# Patient Record
Sex: Male | Born: 1955 | Race: White | Hispanic: No | Smoking: Former smoker
Health system: Southern US, Community
[De-identification: ages and names within clinical notes are randomized; demographics above are authoritative.]

## PROBLEM LIST (undated history)

## (undated) DIAGNOSIS — I10 Essential (primary) hypertension: Secondary | ICD-10-CM

## (undated) DIAGNOSIS — R140 Abdominal distension (gaseous): Secondary | ICD-10-CM

## (undated) DIAGNOSIS — M1990 Unspecified osteoarthritis, unspecified site: Secondary | ICD-10-CM

## (undated) DIAGNOSIS — G629 Polyneuropathy, unspecified: Secondary | ICD-10-CM

## (undated) DIAGNOSIS — F0390 Unspecified dementia without behavioral disturbance: Secondary | ICD-10-CM

## (undated) DIAGNOSIS — K429 Umbilical hernia without obstruction or gangrene: Secondary | ICD-10-CM

## (undated) DIAGNOSIS — Z9981 Dependence on supplemental oxygen: Secondary | ICD-10-CM

## (undated) DIAGNOSIS — K219 Gastro-esophageal reflux disease without esophagitis: Secondary | ICD-10-CM

## (undated) DIAGNOSIS — M79646 Pain in unspecified finger(s): Secondary | ICD-10-CM

## (undated) DIAGNOSIS — C801 Malignant (primary) neoplasm, unspecified: Secondary | ICD-10-CM

## (undated) DIAGNOSIS — E119 Type 2 diabetes mellitus without complications: Secondary | ICD-10-CM

## (undated) DIAGNOSIS — E291 Testicular hypofunction: Secondary | ICD-10-CM

## (undated) DIAGNOSIS — E039 Hypothyroidism, unspecified: Secondary | ICD-10-CM

## (undated) DIAGNOSIS — J449 Chronic obstructive pulmonary disease, unspecified: Secondary | ICD-10-CM

## (undated) DIAGNOSIS — R109 Unspecified abdominal pain: Secondary | ICD-10-CM

## (undated) DIAGNOSIS — M810 Age-related osteoporosis without current pathological fracture: Secondary | ICD-10-CM

## (undated) DIAGNOSIS — Z85828 Personal history of other malignant neoplasm of skin: Secondary | ICD-10-CM

## (undated) DIAGNOSIS — H9190 Unspecified hearing loss, unspecified ear: Secondary | ICD-10-CM

## (undated) DIAGNOSIS — E538 Deficiency of other specified B group vitamins: Secondary | ICD-10-CM

## (undated) DIAGNOSIS — M549 Dorsalgia, unspecified: Secondary | ICD-10-CM

## (undated) DIAGNOSIS — R52 Pain, unspecified: Secondary | ICD-10-CM

## (undated) DIAGNOSIS — G4700 Insomnia, unspecified: Secondary | ICD-10-CM

## (undated) DIAGNOSIS — I639 Cerebral infarction, unspecified: Secondary | ICD-10-CM

## (undated) DIAGNOSIS — IMO0001 Reserved for inherently not codable concepts without codable children: Secondary | ICD-10-CM

## (undated) DIAGNOSIS — K863 Pseudocyst of pancreas: Secondary | ICD-10-CM

## (undated) DIAGNOSIS — G8929 Other chronic pain: Secondary | ICD-10-CM

## (undated) DIAGNOSIS — I2699 Other pulmonary embolism without acute cor pulmonale: Secondary | ICD-10-CM

## (undated) DIAGNOSIS — Z87898 Personal history of other specified conditions: Secondary | ICD-10-CM

## (undated) DIAGNOSIS — I219 Acute myocardial infarction, unspecified: Secondary | ICD-10-CM

## (undated) HISTORY — PX: HERNIA REPAIR: SHX51

## (undated) HISTORY — DX: Pseudocyst of pancreas: K86.3

## (undated) HISTORY — DX: Unspecified hearing loss, unspecified ear: H91.90

## (undated) HISTORY — DX: Type 2 diabetes mellitus without complications: E11.9

## (undated) HISTORY — DX: Hypothyroidism, unspecified: E03.9

## (undated) HISTORY — DX: Age-related osteoporosis without current pathological fracture: M81.0

## (undated) HISTORY — DX: Polyneuropathy, unspecified: G62.9

## (undated) HISTORY — DX: Other pulmonary embolism without acute cor pulmonale: I26.99

## (undated) HISTORY — DX: Cerebral infarction, unspecified: I63.9

## (undated) HISTORY — DX: Unspecified osteoarthritis, unspecified site: M19.90

## (undated) HISTORY — PX: ESOPHAGOGASTRODUODENOSCOPY ENDOSCOPY: SHX5814

## (undated) HISTORY — PX: COLONOSCOPY: SHX174

## (undated) HISTORY — DX: Insomnia, unspecified: G47.00

## (undated) HISTORY — PX: MELANOMA EXCISION: SHX5266

## (undated) HISTORY — DX: Unspecified dementia, unspecified severity, without behavioral disturbance, psychotic disturbance, mood disturbance, and anxiety: F03.90

## (undated) HISTORY — PX: ORIF FIBULA FRACTURE: SHX2121

## (undated) HISTORY — DX: Other chronic pain: G89.29

## (undated) HISTORY — DX: Testicular hypofunction: E29.1

## (undated) HISTORY — DX: Deficiency of other specified B group vitamins: E53.8

## (undated) HISTORY — PX: TONSILLECTOMY: SUR1361

## (undated) HISTORY — PX: SPLENECTOMY: SUR1306

---

## 2013-11-18 DIAGNOSIS — I10 Essential (primary) hypertension: Secondary | ICD-10-CM | POA: Diagnosis not present

## 2013-11-18 DIAGNOSIS — I2510 Atherosclerotic heart disease of native coronary artery without angina pectoris: Secondary | ICD-10-CM | POA: Diagnosis not present

## 2013-11-18 DIAGNOSIS — F17200 Nicotine dependence, unspecified, uncomplicated: Secondary | ICD-10-CM | POA: Diagnosis not present

## 2013-11-19 DIAGNOSIS — I10 Essential (primary) hypertension: Secondary | ICD-10-CM | POA: Diagnosis not present

## 2013-12-12 DIAGNOSIS — D751 Secondary polycythemia: Secondary | ICD-10-CM | POA: Diagnosis not present

## 2013-12-12 DIAGNOSIS — E039 Hypothyroidism, unspecified: Secondary | ICD-10-CM | POA: Diagnosis not present

## 2013-12-12 DIAGNOSIS — F17200 Nicotine dependence, unspecified, uncomplicated: Secondary | ICD-10-CM | POA: Diagnosis not present

## 2013-12-12 DIAGNOSIS — J9819 Other pulmonary collapse: Secondary | ICD-10-CM | POA: Diagnosis not present

## 2013-12-12 DIAGNOSIS — J449 Chronic obstructive pulmonary disease, unspecified: Secondary | ICD-10-CM | POA: Diagnosis not present

## 2013-12-12 DIAGNOSIS — R0602 Shortness of breath: Secondary | ICD-10-CM | POA: Diagnosis not present

## 2013-12-27 DIAGNOSIS — R0902 Hypoxemia: Secondary | ICD-10-CM | POA: Diagnosis not present

## 2014-03-13 DIAGNOSIS — R140 Abdominal distension (gaseous): Secondary | ICD-10-CM | POA: Diagnosis not present

## 2014-03-13 DIAGNOSIS — R109 Unspecified abdominal pain: Secondary | ICD-10-CM | POA: Diagnosis not present

## 2014-03-13 DIAGNOSIS — R0602 Shortness of breath: Secondary | ICD-10-CM | POA: Diagnosis not present

## 2014-03-13 DIAGNOSIS — J449 Chronic obstructive pulmonary disease, unspecified: Secondary | ICD-10-CM | POA: Diagnosis not present

## 2014-07-03 DIAGNOSIS — F17210 Nicotine dependence, cigarettes, uncomplicated: Secondary | ICD-10-CM | POA: Diagnosis not present

## 2014-07-03 DIAGNOSIS — R1031 Right lower quadrant pain: Secondary | ICD-10-CM | POA: Diagnosis not present

## 2014-07-03 DIAGNOSIS — K869 Disease of pancreas, unspecified: Secondary | ICD-10-CM | POA: Diagnosis not present

## 2014-07-03 DIAGNOSIS — I509 Heart failure, unspecified: Secondary | ICD-10-CM | POA: Diagnosis not present

## 2014-07-03 DIAGNOSIS — R1032 Left lower quadrant pain: Secondary | ICD-10-CM | POA: Diagnosis not present

## 2014-07-03 DIAGNOSIS — R109 Unspecified abdominal pain: Secondary | ICD-10-CM | POA: Diagnosis not present

## 2014-07-03 DIAGNOSIS — I10 Essential (primary) hypertension: Secondary | ICD-10-CM | POA: Diagnosis not present

## 2014-07-03 DIAGNOSIS — K219 Gastro-esophageal reflux disease without esophagitis: Secondary | ICD-10-CM | POA: Diagnosis not present

## 2014-07-03 DIAGNOSIS — K862 Cyst of pancreas: Secondary | ICD-10-CM | POA: Diagnosis not present

## 2014-07-03 DIAGNOSIS — J984 Other disorders of lung: Secondary | ICD-10-CM | POA: Diagnosis not present

## 2014-07-03 DIAGNOSIS — K760 Fatty (change of) liver, not elsewhere classified: Secondary | ICD-10-CM | POA: Diagnosis not present

## 2014-07-10 DIAGNOSIS — J441 Chronic obstructive pulmonary disease with (acute) exacerbation: Secondary | ICD-10-CM | POA: Diagnosis not present

## 2014-07-10 DIAGNOSIS — Z6832 Body mass index (BMI) 32.0-32.9, adult: Secondary | ICD-10-CM | POA: Diagnosis not present

## 2014-07-17 DIAGNOSIS — R933 Abnormal findings on diagnostic imaging of other parts of digestive tract: Secondary | ICD-10-CM | POA: Diagnosis not present

## 2014-07-18 ENCOUNTER — Other Ambulatory Visit: Payer: Self-pay

## 2014-07-18 ENCOUNTER — Telehealth: Payer: Self-pay

## 2014-07-18 DIAGNOSIS — R932 Abnormal findings on diagnostic imaging of liver and biliary tract: Secondary | ICD-10-CM

## 2014-07-18 NOTE — Telephone Encounter (Signed)
Instructions have been mailed

## 2014-07-18 NOTE — Telephone Encounter (Signed)
Pt has been scheduled for an EUS for 07/24/14 at 130 pm message left on [facility] voice mail with case to add to the schedule because the office is closed.  No answer on the pt home phone and no voice mail.

## 2014-07-21 NOTE — Telephone Encounter (Signed)
Per Dr Ardis Hughs and Dr Meisenheimer's phone conversation this morning, Dr Ardis Hughs feels it would be best if patient has a preop appointment prior to EUS scheduled for 07/24/14 due to lung disease. He would like to make sure no additional testing is needed prior to sedation for EUS. I have called the endoscopy presurgical team [phone]) at [facility] and have left a detailed message with this information. I have asked that they contact me to confirm that they received my message and to let me know if any additional information is needed.

## 2014-07-21 NOTE — Telephone Encounter (Signed)
I have spoken to [facility] with the endoscopy presurgical team. She confirmed that she did get our request for preop. She asked for additional information regarding patient's lung disease. Per Dr Ardis Hughs, this is a new patient and the only information he was given was that patient had severe lung disease. Per [facility], they will try to get in touch with the patient (they have already left a message for him to call back) and will contact us once this has been done.

## 2014-07-22 ENCOUNTER — Encounter (HOSPITAL_COMMUNITY): Payer: Self-pay | Admitting: *Deleted

## 2014-07-22 NOTE — Telephone Encounter (Signed)
Dalton Morris called to says he had not been able to get in touch with the pt, she will keep trying and let us know if she cant get him in for a preop appt

## 2014-07-22 NOTE — Telephone Encounter (Signed)
Wilhelmina called back to states that they have gotten in touch with patient (phone [phone]) and have scheduled him for a preop appointment for tomorrow, 07/26/14 @ 1:00 pm. I have also spoken to patient who does state he is aware of this. He has not yet gotten any paperwork regarding EUS (this was sent on 07/18/14). I have verbally gone over EUS instructions as well as time/date and location of procedure to which he also verbalizes understanding. He will call back with any questions.

## 2014-07-22 NOTE — Progress Notes (Signed)
07-22-14 1515 Pt. Made awre to come in for 07-23-14 1300 PAT appt to see anesthesia for pre procedure  Consult.

## 2014-07-22 NOTE — Progress Notes (Signed)
07-22-14 0945  Pt  phone number correction received from Dr. Lyda Jester office, [place] [facility].

## 2014-07-22 NOTE — Progress Notes (Signed)
07-22-14 1500 Medical history reviewed with pt per phone this AM. Faxed Information received from [facility] 07-03-14 last visit notes, EKG, CXR, CT abd/pelvis 07-03-14, labs- CBC/d,CMP, Pt/PTT, Urinalysis reports with chart. Information shared with Dr. Landry Dyke, anesthesiologist. Pt. Remains with SOB, using nebulizer every 4-6 hours at home since leaving hospital. Has oxygen 2 l/m nasally for as needed use at bedtime- states"rarely uses. Pt. Has audible wheezes as we talk over the phone. Denies any other use of pulmonary meds. Has no pulmonary MD he sees. Also requested  additional medical records from PCP- [phone]., as well as any heart related studies from [facility] [facility]- [place]) [phone]. Dr. Landry Dyke desires pt to come in for pre procedure visit , so that pulmonary status can be assessed. Lavera Guise Tawanda Schall,RN 07-22-14 1515 "Dottie" of Dr. Ardis Hughs office given update and pt. Corrected phone number- pt to come for pre-anesthesia consult 07-23-14 1300.

## 2014-07-23 ENCOUNTER — Encounter (HOSPITAL_COMMUNITY): Payer: Self-pay | Admitting: Anesthesiology

## 2014-07-23 ENCOUNTER — Encounter (HOSPITAL_COMMUNITY)
Admission: RE | Admit: 2014-07-23 | Discharge: 2014-07-23 | Disposition: A | Discharge status: Home / Self Care | Payer: Medicare Other | Source: Ambulatory Visit | Attending: Gastroenterology | Admitting: Gastroenterology

## 2014-07-23 NOTE — Anesthesia Preprocedure Evaluation (Addendum)
Anesthesia Evaluation  Patient identified by MRN, date of birth, ID band Patient awake    Reviewed: Allergy & Precautions, NPO status , Patient's Chart, lab work & pertinent test results  Airway Mallampati: II  TM Distance: >3 FB Neck ROM: Full    Dental no notable dental hx.    Pulmonary shortness of breath and with exertion, COPD COPD inhaler, Current Smoker,  Oxygen sometimes at night 2 L/minutes nasal cannula (uses 3-4 times per week) breath sounds clear to auscultation  Pulmonary exam normal       Cardiovascular hypertension, Pt. on medications and Pt. on home beta blockers + Past MI Rhythm:Regular Rate:Normal  Cath 2013: non-obstructive CAD   Neuro/Psych negative neurological ROS  negative psych ROS   GI/Hepatic negative GI ROS, Neg liver ROS,   Endo/Other  negative endocrine ROS  Renal/GU negative Renal ROS  negative genitourinary   Musculoskeletal negative musculoskeletal ROS (+)   Abdominal (+) + obese,   Peds negative pediatric ROS (+)  Hematology negative hematology ROS (+)   Anesthesia Other Findings   Reproductive/Obstetrics negative OB ROS                            Anesthesia Physical Anesthesia Plan  ASA: III  Anesthesia Plan: MAC   Post-op Pain Management:    Induction: Intravenous  Airway Management Planned:   Additional Equipment:   Intra-op Plan:   Post-operative Plan:   Informed Consent: I have reviewed the patients History and Physical, chart, labs and discussed the procedure including the risks, benefits and alternatives for the proposed anesthesia with the patient or authorized representative who has indicated his/her understanding and acceptance.   Dental advisory given  Plan Discussed with: CRNA  Anesthesia Plan Comments: (Room air oxygen saturation 97% today. Saw primary Dr. 07-10-14 upper respiratory infection, given steroid dose which helped. No  coughing today. I think he can tolerate MAC for this GI test he really needs soon. He will use his nebulizer tomorrow  Before coming to short stay.)        Anesthesia Quick Evaluation

## 2014-07-23 NOTE — Telephone Encounter (Signed)
Ok, thanks.

## 2014-07-24 ENCOUNTER — Ambulatory Visit (HOSPITAL_COMMUNITY): Payer: Medicare Other | Admitting: Anesthesiology

## 2014-07-24 ENCOUNTER — Telehealth: Payer: Self-pay

## 2014-07-24 ENCOUNTER — Encounter (HOSPITAL_COMMUNITY): Payer: Self-pay | Admitting: *Deleted

## 2014-07-24 ENCOUNTER — Encounter (HOSPITAL_COMMUNITY)
Admission: RE | Disposition: A | Discharge status: Home / Self Care | Payer: Self-pay | Source: Ambulatory Visit | Attending: Gastroenterology

## 2014-07-24 ENCOUNTER — Ambulatory Visit (HOSPITAL_COMMUNITY)
Admission: RE | Admit: 2014-07-24 | Discharge: 2014-07-24 | Disposition: A | Discharge status: Home / Self Care | Payer: Medicare Other | Source: Ambulatory Visit | Attending: Gastroenterology | Admitting: Gastroenterology

## 2014-07-24 DIAGNOSIS — Z6831 Body mass index (BMI) 31.0-31.9, adult: Secondary | ICD-10-CM | POA: Insufficient documentation

## 2014-07-24 DIAGNOSIS — Z9981 Dependence on supplemental oxygen: Secondary | ICD-10-CM | POA: Insufficient documentation

## 2014-07-24 DIAGNOSIS — K862 Cyst of pancreas: Secondary | ICD-10-CM | POA: Diagnosis not present

## 2014-07-24 DIAGNOSIS — Z79899 Other long term (current) drug therapy: Secondary | ICD-10-CM | POA: Diagnosis not present

## 2014-07-24 DIAGNOSIS — K868 Other specified diseases of pancreas: Secondary | ICD-10-CM | POA: Diagnosis not present

## 2014-07-24 DIAGNOSIS — I252 Old myocardial infarction: Secondary | ICD-10-CM | POA: Insufficient documentation

## 2014-07-24 DIAGNOSIS — I2510 Atherosclerotic heart disease of native coronary artery without angina pectoris: Secondary | ICD-10-CM | POA: Insufficient documentation

## 2014-07-24 DIAGNOSIS — I10 Essential (primary) hypertension: Secondary | ICD-10-CM | POA: Diagnosis not present

## 2014-07-24 DIAGNOSIS — G8929 Other chronic pain: Secondary | ICD-10-CM | POA: Insufficient documentation

## 2014-07-24 DIAGNOSIS — R932 Abnormal findings on diagnostic imaging of liver and biliary tract: Secondary | ICD-10-CM

## 2014-07-24 DIAGNOSIS — J449 Chronic obstructive pulmonary disease, unspecified: Secondary | ICD-10-CM | POA: Diagnosis not present

## 2014-07-24 DIAGNOSIS — F17210 Nicotine dependence, cigarettes, uncomplicated: Secondary | ICD-10-CM | POA: Insufficient documentation

## 2014-07-24 DIAGNOSIS — M549 Dorsalgia, unspecified: Secondary | ICD-10-CM | POA: Insufficient documentation

## 2014-07-24 DIAGNOSIS — R1012 Left upper quadrant pain: Secondary | ICD-10-CM | POA: Diagnosis present

## 2014-07-24 DIAGNOSIS — E669 Obesity, unspecified: Secondary | ICD-10-CM | POA: Diagnosis not present

## 2014-07-24 HISTORY — DX: Essential (primary) hypertension: I10

## 2014-07-24 HISTORY — DX: Pain, unspecified: R52

## 2014-07-24 HISTORY — PX: EUS: SHX5427

## 2014-07-24 HISTORY — DX: Umbilical hernia without obstruction or gangrene: K42.9

## 2014-07-24 HISTORY — DX: Unspecified abdominal pain: R10.9

## 2014-07-24 HISTORY — DX: Acute myocardial infarction, unspecified: I21.9

## 2014-07-24 HISTORY — DX: Reserved for inherently not codable concepts without codable children: IMO0001

## 2014-07-24 HISTORY — DX: Personal history of other specified conditions: Z87.898

## 2014-07-24 HISTORY — DX: Dependence on supplemental oxygen: Z99.81

## 2014-07-24 LAB — GRAM STAIN

## 2014-07-24 LAB — PANC CYST FLD ANLYS-PATHFNDR-TG

## 2014-07-24 SURGERY — UPPER ENDOSCOPIC ULTRASOUND (EUS) LINEAR
Anesthesia: Monitor Anesthesia Care

## 2014-07-24 MED ORDER — SODIUM CHLORIDE 0.9 % IV SOLN: INTRAVENOUS | Status: DC

## 2014-07-24 MED ORDER — CIPROFLOXACIN IN D5W 400 MG/200ML IV SOLN
400.0000 mg | Freq: Once | INTRAVENOUS | Status: AC
  Administered 2014-07-24: 400 mg via INTRAVENOUS

## 2014-07-24 MED ORDER — METRONIDAZOLE 250 MG PO TABS: 250.0000 mg | ORAL_TABLET | Freq: Three times a day (TID) | ORAL | Status: DC

## 2014-07-24 MED ORDER — LACTATED RINGERS IV SOLN
INTRAVENOUS | Status: DC
  Administered 2014-07-24: 1000 mL via INTRAVENOUS

## 2014-07-24 MED ORDER — PROPOFOL 10 MG/ML IV BOLUS
INTRAVENOUS | Status: AC
  Filled 2014-07-24: qty 20

## 2014-07-24 MED ORDER — CIPROFLOXACIN IN D5W 400 MG/200ML IV SOLN
INTRAVENOUS | Status: AC
  Filled 2014-07-24: qty 200

## 2014-07-24 MED ORDER — CIPROFLOXACIN HCL 500 MG PO TABS: 500.0000 mg | ORAL_TABLET | Freq: Two times a day (BID) | ORAL | Status: DC

## 2014-07-24 MED ORDER — PROPOFOL INFUSION 10 MG/ML OPTIME
INTRAVENOUS | Status: DC | PRN
  Administered 2014-07-24: 300 ug/kg/min via INTRAVENOUS

## 2014-07-24 NOTE — Transfer of Care (Signed)
Immediate Anesthesia Transfer of Care Note  Patient: Dalton Morris  Procedure(s) Performed: Procedure(s): UPPER ENDOSCOPIC ULTRASOUND (EUS) LINEAR (N/A)  Patient Location: PACU and Endoscopy Unit  Anesthesia Type:MAC  Level of Consciousness: sedated and patient cooperative  Airway & Oxygen Therapy: Patient Spontanous Breathing and Patient connected to nasal cannula oxygen  Post-op Assessment: Report given to RN and Post -op Vital signs reviewed and stable  Post vital signs: Reviewed and stable  Last Vitals:  Filed Vitals:   07/24/14 1206  BP: 175/97  Pulse: 88  Temp: 36.7 C  Resp: 22    Complications: No apparent anesthesia complications

## 2014-07-24 NOTE — Anesthesia Postprocedure Evaluation (Signed)
  Anesthesia Post-op Note  Patient: Dalton Morris  Procedure(s) Performed: Procedure(s): UPPER ENDOSCOPIC ULTRASOUND (EUS) LINEAR (N/A)  Patient Location: PACU and Endoscopy Unit  Anesthesia Type:MAC  Level of Consciousness: awake  Airway and Oxygen Therapy: Patient Spontanous Breathing  Post-op Pain: mild  Post-op Assessment: Post-op Vital signs reviewed  Post-op Vital Signs: Reviewed  Last Vitals:  Filed Vitals:   07/24/14 1420  BP: 141/105  Pulse: 80  Temp:   Resp: 21    Complications: No apparent anesthesia complications

## 2014-07-24 NOTE — Discharge Instructions (Signed)

## 2014-07-24 NOTE — Telephone Encounter (Signed)
-----   Message from Milus Banister, MD sent at 07/24/2014  2:05 PM EDT ----- He also needs CT scan Iv and oral contrast, pancreatic protocol in 4-5 weeks, here in [place].  thanks

## 2014-07-24 NOTE — H&P (Signed)
  HPI: This is a man with 4 weeks of LUQ pain, gradually worsening.  No fevers or chills. + post prandial discomfort for about a year. Stable weight.  Used to drink 2-3 beers daily, stopped completely 6 months ago. Never had pancreatic disease and no pancreatic disease in family.  Sent by Dr. Lyda Jester after ER visit in [place] found 8cm cystic lesion in body/tail of pancreas.   Labs 06/2014: Hb 18.4, wbc 11, plt 237, INR 1, LFTs normal    Past Medical History  Diagnosis Date  . Hypertension   . Pain     "chronic pain" -back  . Myocardial infarction     MI -3 yrs ago (mild)  . History of oxygen administration     2l/m per nasally at bedtime as needed(in use for 4 months)  . Umbilical hernia     at present  . H/O dizziness     occ. episodes- passes quickly.  . Abdominal pain     " left side- beneath left ribcage"  . Shortness of breath dyspnea     07-22-14 aubible wheezes while talking with pt today. Uses nebulizer-started a week ago after hospital visit [facility] for SOB.    Past Surgical History  Procedure Laterality Date  . Orif fibula fracture Left   . Colonoscopy    . Esophagogastroduodenoscopy endoscopy    . Tonsillectomy      child    Current Facility-Administered Medications  Medication Dose Route Frequency Provider Last Rate Last Dose  . 0.9 %  sodium chloride infusion   Intravenous Continuous Milus Banister, MD        Allergies as of 07/18/2014  . (Not on File)    History reviewed. No pertinent family history.  History   Social History  . Marital Status: Widowed    Spouse Name: N/A  . Number of Children: N/A  . Years of Education: N/A   Occupational History  . Not on file.   Social History Main Topics  . Smoking status: Current Every Day Smoker -- 0.50 packs/day for 40 years    Types: Cigarettes  . Smokeless tobacco: Not on file     Comment: trying to quit  . Alcohol Use: No     Comment: none in many years  . Drug Use: No  . Sexual  Activity: Not on file   Other Topics Concern  . Not on file   Social History Narrative      Physical Exam: There were no vitals taken for this visit. Constitutional: generally well-appearing Psychiatric: alert and oriented x3 Abdomen: soft, nontender, nondistended, no obvious ascites, no peritoneal signs, normal bowel sounds     Assessment and plan: 59 y.o. male with large cystic lesion in pancreas, likely symptomatic  Will proceed with EUS today

## 2014-07-24 NOTE — Op Note (Signed)
[facility] [place], [address]   ENDOSCOPIC ULTRASOUND PROCEDURE REPORT  PATIENT: Dalton, Morris  MR#: [id number] BIRTHDATE: [birth year]  GENDER: male ENDOSCOPIST: Milus Banister, MD REFERRED BY:  Kyra Leyland, M.D. PROCEDURE DATE:  07/24/2014 PROCEDURE:   Upper EUS w/FNA ASA CLASS:      Class III INDICATIONS:   1.  abd pain for 1 year, LUQ specifically for 4 week, 8cm cystic lesion in pancreatic body, tail.Marland Kitchen MEDICATIONS: Monitored anesthesia care, cipro  DESCRIPTION OF PROCEDURE:   After the risks benefits and alternatives of the procedure were  explained, informed consent was obtained. The patient was then placed in the left, lateral, decubitus postion and IV sedation was administered. Throughout the procedure, the patients blood pressure, pulse and oxygen saturations were monitored continuously.  Under direct visualization, the Pentax Radial EUS [id number]  endoscope was introduced through the mouth  and advanced to the second portion of the duodenum .  Water was used as necessary to provide an acoustic interface.  Upon completion of the imaging, water was removed and the patient was sent to the recovery room in satisfactory condition.  Endoscopic findings: 1. Normal UGI tract  EUS findings: 1. There was a large cystic lesion in the region of the pancreatic body that precluded complete evaluation of the pancreatic parenchyma.  The head of pancreas and neck appeared normal, but no other regions of the pancreas were visible.  The cystic lesion was not anechoic and there appeared to be some lobular soft tissue within the cyst.  The cyst measured at least 8.3cm across. Using a single transgastric pass with a 19 gauge EUS FNA needle I aspirated 120cc of murky, tan liquid that was not particularly malodorous. This fluid was sent to gram stain, culture, CEA, amylase and the bulk was sent to cytology. 2. CBD appeared normal 3. Limited  views of liver, spleen, portal and splenic vessels were all normal.  ENDOSCOPIC IMPRESSION: INcomplete evaluation of pancreatic parenchyma due to presense of large (8.3cm) cystic lesion. The cyst was aspirated, yielding 120cc of tannish, murky fluid.  It is not clear what the underlying process is here.  Perhaps pancreatic abscess, perhaps this is neoplastic, perhaps this is a murky pseudocyst.  RECOMMENDATIONS: Will start him on cipro/flagyl for 2 weeks and get repeat CT scan locally in 4 weeks.  Final fluid testing may guide management further.  _______________________________ eSigned:  Milus Banister, MD 07/24/2014 1:46 PM

## 2014-07-25 ENCOUNTER — Encounter (HOSPITAL_COMMUNITY): Payer: Self-pay | Admitting: Gastroenterology

## 2014-07-27 LAB — CULTURE, ROUTINE-ABSCESS

## 2014-07-29 ENCOUNTER — Telehealth: Payer: Self-pay | Admitting: Gastroenterology

## 2014-07-29 NOTE — Telephone Encounter (Signed)
He also needs CT scan Iv and oral contrast, pancreatic protocol in 4-5 weeks, here in [place]. thanks

## 2014-07-31 NOTE — Telephone Encounter (Signed)
I will call pt mid to late May to set up CT.  I have sent a staff message to call pt at that time

## 2014-08-08 ENCOUNTER — Telehealth: Payer: Self-pay

## 2014-08-08 DIAGNOSIS — K862 Cyst of pancreas: Secondary | ICD-10-CM

## 2014-08-08 NOTE — Telephone Encounter (Signed)
Dalton Morris, I spoke with him. He needs CT scan abd/pelvis in 2 weeks in [place], pancreatic protocol, to follow up pancreatic cystic lesion. He also needs referral to Dr. Barry Dienes to discuss large, symptomatic pancreatic cyst that is probably mucinous, potentially precancerous. Thanks                 CEA was 13,145 ng/mL     Amylase 63 U/L    Cytology no malignant cells.

## 2014-08-13 NOTE — Telephone Encounter (Signed)
Referral appt is scheduled with Dr. Barry Dienes at [place]  for 09/15/14 8:30 arrival for a 9:00 appt.   CT scan abd/pelvis with pancreatic protocol is scheduled for 09/22/14 at [facility] CT .  He needs to arrive at 10:20 to drink water at CT for 11:00 scan.  Per Marzetta Board he will not get oral contrast.  Patient advised to be 4 hours NPO. Patient notified of the appt date and times for CT and surgical referral.  He verbalized understanding of all instructions.

## 2014-08-22 ENCOUNTER — Ambulatory Visit (INDEPENDENT_AMBULATORY_CARE_PROVIDER_SITE_OTHER)
Admission: RE | Admit: 2014-08-22 | Discharge: 2014-08-22 | Disposition: A | Discharge status: Home / Self Care | Payer: Medicare Other | Source: Ambulatory Visit | Attending: Gastroenterology | Admitting: Gastroenterology

## 2014-08-22 DIAGNOSIS — K862 Cyst of pancreas: Secondary | ICD-10-CM

## 2014-08-22 DIAGNOSIS — K868 Other specified diseases of pancreas: Secondary | ICD-10-CM | POA: Diagnosis not present

## 2014-08-22 DIAGNOSIS — K760 Fatty (change of) liver, not elsewhere classified: Secondary | ICD-10-CM | POA: Diagnosis not present

## 2014-08-22 MED ORDER — IOHEXOL 300 MG/ML  SOLN
100.0000 mL | Freq: Once | INTRAMUSCULAR | Status: AC | PRN
  Administered 2014-08-22: 100 mL via INTRAVENOUS

## 2014-09-04 ENCOUNTER — Encounter: Payer: Self-pay | Admitting: Gastroenterology

## 2014-09-15 ENCOUNTER — Other Ambulatory Visit: Payer: Self-pay | Admitting: General Surgery

## 2014-09-15 DIAGNOSIS — K862 Cyst of pancreas: Secondary | ICD-10-CM | POA: Diagnosis not present

## 2014-09-19 ENCOUNTER — Other Ambulatory Visit: Payer: Self-pay | Admitting: General Surgery

## 2014-09-29 ENCOUNTER — Other Ambulatory Visit: Payer: Self-pay | Admitting: General Surgery

## 2014-09-29 NOTE — Progress Notes (Signed)
Please put orders in Epic surgery 10-09-14 pre op 10-06-14 Thanks

## 2014-10-03 NOTE — Patient Instructions (Addendum)
YOUR PROCEDURE IS SCHEDULED ON : 10/09/14  REPORT TO [facility] MAIN ENTRANCE FOLLOW SIGNS TO EAST ELEVATOR - GO TO 3rd FLOOR CHECK IN AT 3 EAST NURSES STATION (SHORT STAY) AT:  5:30 AM  CALL THIS NUMBER IF YOU HAVE PROBLEMS THE MORNING OF SURGERY [phone]  REMEMBER:ONLY 1 PER PERSON MAY GO TO SHORT STAY WITH YOU TO GET READY THE MORNING OF YOUR SURGERY  DO NOT EAT FOOD OR DRINK LIQUIDS AFTER MIDNIGHT  TAKE THESE MEDICINES THE MORNING OF SURGERY:  METOPROLOL / USE ALBUTEROL NEBULIZER  STOP ASPIRIN / IBUPROFEN / ALEVE / VITAMINS / HERBAL MEDS __5__ DAYS BEFORE SURGERY  USE FLEET ENEMA THE NIGHT BEFORE SURGERY  YOU MAY NOT HAVE ANY METAL ON YOUR BODY INCLUDING HAIR PINS AND PIERCING'S. DO NOT WEAR JEWELRY, MAKEUP, LOTIONS, POWDERS OR PERFUMES. DO NOT WEAR NAIL POLISH. DO NOT SHAVE 48 HRS PRIOR TO SURGERY. MEN MAY SHAVE FACE AND NECK.  DO NOT [place]. [facility] IS NOT RESPONSIBLE FOR VALUABLES.  CONTACTS, DENTURES OR PARTIALS MAY NOT BE WORN TO SURGERY. LEAVE SUITCASE IN CAR. CAN BE BROUGHT TO ROOM AFTER SURGERY.  PATIENTS DISCHARGED THE DAY OF SURGERY WILL NOT BE ALLOWED TO DRIVE HOME.  PLEASE READ OVER THE FOLLOWING INSTRUCTION SHEETS _________________________________________________________________________________                                          [place] - PREPARING FOR SURGERY  Before surgery, you can play an important role.  Because skin is not sterile, your skin needs to be as free of germs as possible.  You can reduce the number of germs on your skin by washing with CHG (chlorahexidine gluconate) soap before surgery.  CHG is an antiseptic cleaner which kills germs and bonds with the skin to continue killing germs even after washing. Please DO NOT use if you have an allergy to CHG or antibacterial soaps.  If your skin becomes reddened/irritated stop using the CHG and inform your nurse when you arrive at Short Stay. Do not  shave (including legs and underarms) for at least 48 hours prior to the first CHG shower.  You may shave your face. Please follow these instructions carefully:   1.  Shower with CHG Soap the night before surgery and the  morning of Surgery.   2.  If you choose to wash your hair, wash your hair first as usual with your  normal  Shampoo.   3.  After you shampoo, rinse your hair and body thoroughly to remove the  shampoo.                                         4.  Use CHG as you would any other liquid soap.  You can apply chg directly  to the skin and wash . Gently wash with scrungie or clean wascloth    5.  Apply the CHG Soap to your body ONLY FROM THE NECK DOWN.   Do not use on open                           Wound or open sores. Avoid contact with eyes, ears mouth and genitals (private parts).  Genitals (private parts) with your normal soap.              6.  Wash thoroughly, paying special attention to the area where your surgery  will be performed.   7.  Thoroughly rinse your body with warm water from the neck down.   8.  DO NOT shower/wash with your normal soap after using and rinsing off  the CHG Soap .                9.  Pat yourself dry with a clean towel.             10.  Wear clean night clothes to bed after shower             11.  Place clean sheets on your bed the night of your first shower and do not  sleep with pets.  Day of Surgery : Do not apply any lotions/deodorants the morning of surgery.  Please wear clean clothes to the hospital/surgery center.  FAILURE TO FOLLOW THESE INSTRUCTIONS MAY RESULT IN THE CANCELLATION OF YOUR SURGERY    PATIENT SIGNATURE_________________________________  ______________________________________________________________________

## 2014-10-06 ENCOUNTER — Encounter (HOSPITAL_COMMUNITY): Payer: Self-pay

## 2014-10-06 ENCOUNTER — Encounter (HOSPITAL_COMMUNITY)
Admission: RE | Admit: 2014-10-06 | Discharge: 2014-10-06 | Disposition: A | Discharge status: Home / Self Care | Payer: Medicare Other | Source: Ambulatory Visit | Attending: General Surgery | Admitting: General Surgery

## 2014-10-06 HISTORY — DX: Gastro-esophageal reflux disease without esophagitis: K21.9

## 2014-10-06 HISTORY — DX: Malignant (primary) neoplasm, unspecified: C80.1

## 2014-10-06 HISTORY — DX: Pain in unspecified finger(s): M79.646

## 2014-10-06 HISTORY — DX: Other chronic pain: G89.29

## 2014-10-06 HISTORY — DX: Abdominal distension (gaseous): R14.0

## 2014-10-06 HISTORY — DX: Chronic obstructive pulmonary disease, unspecified: J44.9

## 2014-10-06 HISTORY — DX: Personal history of other malignant neoplasm of skin: Z85.828

## 2014-10-06 HISTORY — DX: Dorsalgia, unspecified: M54.9

## 2014-10-06 LAB — URINALYSIS, ROUTINE W REFLEX MICROSCOPIC
Glucose, UA: >1000 mg/dL — AB
Hgb urine dipstick: NEGATIVE
Ketones, ur: NEGATIVE mg/dL
LEUKOCYTES UA: NEGATIVE
Nitrite: NEGATIVE
PROTEIN: NEGATIVE mg/dL
SPECIFIC GRAVITY, URINE: 1.031 — AB (ref 1.005–1.030)
UROBILINOGEN UA: 1 mg/dL (ref 0.0–1.0)
pH: 6 (ref 5.0–8.0)

## 2014-10-06 LAB — ABO/RH: ABO/RH(D): A POS

## 2014-10-06 LAB — COMPREHENSIVE METABOLIC PANEL
ALBUMIN: 4.2 g/dL (ref 3.5–5.0)
ALT: 56 U/L (ref 17–63)
AST: 41 U/L (ref 15–41)
Alkaline Phosphatase: 52 U/L (ref 38–126)
Anion gap: 9 (ref 5–15)
BUN: 15 mg/dL (ref 6–20)
CO2: 27 mmol/L (ref 22–32)
CREATININE: 1.06 mg/dL (ref 0.61–1.24)
Calcium: 9.3 mg/dL (ref 8.9–10.3)
Chloride: 101 mmol/L (ref 101–111)
GFR calc Af Amer: >60 mL/min (ref >60)
GFR calc non Af Amer: >60 mL/min (ref >60)
Glucose, Bld: 117 mg/dL — ABNORMAL HIGH (ref 65–99)
Potassium: 5.2 mmol/L — ABNORMAL HIGH (ref 3.5–5.1)
Sodium: 137 mmol/L (ref 135–145)
TOTAL PROTEIN: 8.1 g/dL (ref 6.5–8.1)
Total Bilirubin: 1.3 mg/dL — ABNORMAL HIGH (ref 0.3–1.2)

## 2014-10-06 LAB — CBC WITH DIFFERENTIAL/PLATELET
BASOS ABS: 0 10*3/uL (ref 0.0–0.1)
Basophils Relative: 0 % (ref 0–1)
EOS PCT: 4 % (ref 0–5)
Eosinophils Absolute: 0.3 10*3/uL (ref 0.0–0.7)
HCT: 52.8 % — ABNORMAL HIGH (ref 39.0–52.0)
Hemoglobin: 18.6 g/dL — ABNORMAL HIGH (ref 13.0–17.0)
LYMPHS ABS: 2.6 10*3/uL (ref 0.7–4.0)
LYMPHS PCT: 28 % (ref 12–46)
MCH: 33.4 pg (ref 26.0–34.0)
MCHC: 35.2 g/dL (ref 30.0–36.0)
MCV: 94.8 fL (ref 78.0–100.0)
Monocytes Absolute: 1.1 10*3/uL — ABNORMAL HIGH (ref 0.1–1.0)
Monocytes Relative: 12 % (ref 3–12)
NEUTROS ABS: 5.1 10*3/uL (ref 1.7–7.7)
Neutrophils Relative %: 56 % (ref 43–77)
PLATELETS: 223 10*3/uL (ref 150–400)
RBC: 5.57 MIL/uL (ref 4.22–5.81)
RDW: 12.8 % (ref 11.5–15.5)
WBC: 9.1 10*3/uL (ref 4.0–10.5)

## 2014-10-06 LAB — PROTIME-INR
INR: 0.93 (ref 0.00–1.49)
Prothrombin Time: 12.7 seconds (ref 11.6–15.2)

## 2014-10-06 LAB — URINE MICROSCOPIC-ADD ON: Urine-Other: NONE SEEN

## 2014-10-06 NOTE — Progress Notes (Deleted)
Due to pt comments concerning intermitant chest pain for past 2 weeks with no sweats, no nausea, very vague about radiation to arm (unsure if it occurs or not) I had EKG done and reviewed by Dr. Tobias Alexander, anesthesiologist. He said there was no further action needed and EKG was normal. I advised pt of this and instructed him see a physician if pain persisted and did not resolve.

## 2014-10-06 NOTE — Progress Notes (Signed)
   10/06/14 1315  OBSTRUCTIVE SLEEP APNEA  Have you ever been diagnosed with sleep apnea through a sleep study? No  Do you snore loudly (loud enough to be heard through closed doors)?  0  Do you often feel tired, fatigued, or sleepy during the daytime? 0  Has anyone observed you stop breathing during your sleep? 0  Do you have, or are you being treated for high blood pressure? 1  BMI more than 35 kg/m2? 0  Age over 59 years old? 1  Neck circumference greater than 40 cm/16 inches? 1  Gender: 1

## 2014-10-07 NOTE — Progress Notes (Signed)
Records requested and received from [facility].  [facility] ( have not received these notes as of 11/06/14)

## 2014-10-08 NOTE — Anesthesia Preprocedure Evaluation (Addendum)
Anesthesia Evaluation  Patient identified by MRN, date of birth, ID band Patient awake    Reviewed: Allergy & Precautions, NPO status , Patient's Chart, lab work & pertinent test results, reviewed documented beta blocker date and time   Airway Mallampati: II  TM Distance: >3 FB Neck ROM: Full    Dental  (+) Dental Advisory Given, Edentulous Lower, Edentulous Upper   Pulmonary shortness of breath and with exertion, COPD COPD inhaler and oxygen dependent, Current Smoker,  Oxygen sometimes at night 2 L/minutes nasal cannula (uses 3-4 times per week) breath sounds clear to auscultation  Pulmonary exam normal       Cardiovascular hypertension, Pt. on medications and Pt. 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 Mild MI 2013   Neuro/Psych negative neurological ROS  negative psych ROS   GI/Hepatic negative GI ROS, Neg liver ROS, GERD-  Medicated and Controlled,  Endo/Other  negative endocrine ROS  Renal/GU negative Renal ROS  negative genitourinary   Musculoskeletal negative musculoskeletal ROS (+)   Abdominal (+) + obese,   Peds negative pediatric ROS (+)  Hematology negative hematology ROS (+)   Anesthesia Other Findings   Reproductive/Obstetrics negative OB ROS                            Anesthesia Physical Anesthesia Plan  ASA: III  Anesthesia Plan: General   Post-op Pain Management:    Induction: Intravenous  Airway Management Planned: Oral ETT  Additional Equipment:   Intra-op Plan:   Post-operative Plan: Possible Post-op intubation/ventilation  Informed Consent:   Plan Discussed with: Surgeon  Anesthesia Plan Comments:        Anesthesia Quick Evaluation

## 2014-10-09 ENCOUNTER — Inpatient Hospital Stay (HOSPITAL_COMMUNITY): Payer: Medicare Other | Admitting: Anesthesiology

## 2014-10-09 ENCOUNTER — Inpatient Hospital Stay (HOSPITAL_COMMUNITY)
Admission: RE | Admit: 2014-10-09 | Discharge: 2014-10-18 | DRG: 406 | Disposition: A | Discharge status: Home / Self Care | Payer: Medicare Other | Source: Ambulatory Visit | Attending: General Surgery | Admitting: General Surgery

## 2014-10-09 ENCOUNTER — Encounter (HOSPITAL_COMMUNITY)
Admission: RE | Disposition: A | Discharge status: Home / Self Care | Payer: Self-pay | Source: Ambulatory Visit | Attending: General Surgery

## 2014-10-09 ENCOUNTER — Encounter (HOSPITAL_COMMUNITY): Payer: Self-pay | Admitting: *Deleted

## 2014-10-09 DIAGNOSIS — M549 Dorsalgia, unspecified: Secondary | ICD-10-CM | POA: Diagnosis not present

## 2014-10-09 DIAGNOSIS — R739 Hyperglycemia, unspecified: Secondary | ICD-10-CM | POA: Diagnosis not present

## 2014-10-09 DIAGNOSIS — R0902 Hypoxemia: Secondary | ICD-10-CM | POA: Diagnosis not present

## 2014-10-09 DIAGNOSIS — N179 Acute kidney failure, unspecified: Secondary | ICD-10-CM | POA: Diagnosis not present

## 2014-10-09 DIAGNOSIS — F419 Anxiety disorder, unspecified: Secondary | ICD-10-CM | POA: Diagnosis present

## 2014-10-09 DIAGNOSIS — I252 Old myocardial infarction: Secondary | ICD-10-CM

## 2014-10-09 DIAGNOSIS — D136 Benign neoplasm of pancreas: Principal | ICD-10-CM | POA: Diagnosis present

## 2014-10-09 DIAGNOSIS — J9811 Atelectasis: Secondary | ICD-10-CM | POA: Diagnosis not present

## 2014-10-09 DIAGNOSIS — K219 Gastro-esophageal reflux disease without esophagitis: Secondary | ICD-10-CM | POA: Diagnosis present

## 2014-10-09 DIAGNOSIS — R061 Stridor: Secondary | ICD-10-CM | POA: Diagnosis not present

## 2014-10-09 DIAGNOSIS — J9601 Acute respiratory failure with hypoxia: Secondary | ICD-10-CM | POA: Diagnosis not present

## 2014-10-09 DIAGNOSIS — D72829 Elevated white blood cell count, unspecified: Secondary | ICD-10-CM | POA: Diagnosis present

## 2014-10-09 DIAGNOSIS — E669 Obesity, unspecified: Secondary | ICD-10-CM | POA: Diagnosis present

## 2014-10-09 DIAGNOSIS — Z8249 Family history of ischemic heart disease and other diseases of the circulatory system: Secondary | ICD-10-CM | POA: Diagnosis not present

## 2014-10-09 DIAGNOSIS — J449 Chronic obstructive pulmonary disease, unspecified: Secondary | ICD-10-CM | POA: Diagnosis not present

## 2014-10-09 DIAGNOSIS — K429 Umbilical hernia without obstruction or gangrene: Secondary | ICD-10-CM | POA: Diagnosis not present

## 2014-10-09 DIAGNOSIS — J410 Simple chronic bronchitis: Secondary | ICD-10-CM | POA: Diagnosis not present

## 2014-10-09 DIAGNOSIS — J9620 Acute and chronic respiratory failure, unspecified whether with hypoxia or hypercapnia: Secondary | ICD-10-CM | POA: Diagnosis not present

## 2014-10-09 DIAGNOSIS — Z833 Family history of diabetes mellitus: Secondary | ICD-10-CM

## 2014-10-09 DIAGNOSIS — K862 Cyst of pancreas: Secondary | ICD-10-CM | POA: Diagnosis not present

## 2014-10-09 DIAGNOSIS — R062 Wheezing: Secondary | ICD-10-CM

## 2014-10-09 DIAGNOSIS — D62 Acute posthemorrhagic anemia: Secondary | ICD-10-CM | POA: Diagnosis not present

## 2014-10-09 DIAGNOSIS — J45909 Unspecified asthma, uncomplicated: Secondary | ICD-10-CM | POA: Diagnosis present

## 2014-10-09 DIAGNOSIS — I10 Essential (primary) hypertension: Secondary | ICD-10-CM | POA: Diagnosis present

## 2014-10-09 DIAGNOSIS — E875 Hyperkalemia: Secondary | ICD-10-CM | POA: Diagnosis present

## 2014-10-09 DIAGNOSIS — I2510 Atherosclerotic heart disease of native coronary artery without angina pectoris: Secondary | ICD-10-CM | POA: Diagnosis present

## 2014-10-09 DIAGNOSIS — F17210 Nicotine dependence, cigarettes, uncomplicated: Secondary | ICD-10-CM | POA: Diagnosis present

## 2014-10-09 DIAGNOSIS — E876 Hypokalemia: Secondary | ICD-10-CM | POA: Diagnosis not present

## 2014-10-09 DIAGNOSIS — R0600 Dyspnea, unspecified: Secondary | ICD-10-CM | POA: Diagnosis not present

## 2014-10-09 DIAGNOSIS — K8689 Other specified diseases of pancreas: Secondary | ICD-10-CM | POA: Diagnosis present

## 2014-10-09 DIAGNOSIS — K913 Postprocedural intestinal obstruction: Secondary | ICD-10-CM | POA: Diagnosis not present

## 2014-10-09 DIAGNOSIS — R918 Other nonspecific abnormal finding of lung field: Secondary | ICD-10-CM | POA: Diagnosis not present

## 2014-10-09 DIAGNOSIS — Z720 Tobacco use: Secondary | ICD-10-CM | POA: Diagnosis not present

## 2014-10-09 DIAGNOSIS — D490 Neoplasm of unspecified behavior of digestive system: Secondary | ICD-10-CM | POA: Diagnosis not present

## 2014-10-09 HISTORY — PX: PANCREATECTOMY: SHX5261

## 2014-10-09 HISTORY — PX: UMBILICAL HERNIA REPAIR: SHX196

## 2014-10-09 LAB — CBC
HEMATOCRIT: 46.2 % (ref 39.0–52.0)
Hemoglobin: 15.6 g/dL (ref 13.0–17.0)
MCH: 32.6 pg (ref 26.0–34.0)
MCHC: 33.8 g/dL (ref 30.0–36.0)
MCV: 96.5 fL (ref 78.0–100.0)
Platelets: 223 10*3/uL (ref 150–400)
RBC: 4.79 MIL/uL (ref 4.22–5.81)
RDW: 12.9 % (ref 11.5–15.5)
WBC: 17.3 10*3/uL — ABNORMAL HIGH (ref 4.0–10.5)

## 2014-10-09 LAB — MRSA PCR SCREENING: MRSA by PCR: NEGATIVE

## 2014-10-09 SURGERY — PANCREATECTOMY, LAPAROSCOPIC
Anesthesia: General

## 2014-10-09 MED ORDER — DEXAMETHASONE SODIUM PHOSPHATE 10 MG/ML IJ SOLN
INTRAMUSCULAR | Status: AC
  Filled 2014-10-09: qty 1

## 2014-10-09 MED ORDER — SUGAMMADEX SODIUM 500 MG/5ML IV SOLN
INTRAVENOUS | Status: AC
  Filled 2014-10-09: qty 5

## 2014-10-09 MED ORDER — ACETAMINOPHEN 10 MG/ML IV SOLN
INTRAVENOUS | Status: AC
  Filled 2014-10-09: qty 100

## 2014-10-09 MED ORDER — METOPROLOL TARTRATE 1 MG/ML IV SOLN
5.0000 mg | Freq: Four times a day (QID) | INTRAVENOUS | Status: DC
  Administered 2014-10-09 – 2014-10-16 (×27): 5 mg via INTRAVENOUS
  Filled 2014-10-09 (×31): qty 5

## 2014-10-09 MED ORDER — METOCLOPRAMIDE HCL 5 MG/ML IJ SOLN
INTRAMUSCULAR | Status: DC | PRN
  Administered 2014-10-09: 10 mg via INTRAVENOUS

## 2014-10-09 MED ORDER — DIPHENHYDRAMINE HCL 50 MG/ML IJ SOLN: 12.5000 mg | Freq: Four times a day (QID) | INTRAMUSCULAR | Status: DC | PRN

## 2014-10-09 MED ORDER — NALOXONE HCL 0.4 MG/ML IJ SOLN: 0.4000 mg | INTRAMUSCULAR | Status: DC | PRN

## 2014-10-09 MED ORDER — PROPOFOL 10 MG/ML IV BOLUS
INTRAVENOUS | Status: AC
  Filled 2014-10-09: qty 20

## 2014-10-09 MED ORDER — HYDROMORPHONE 0.3 MG/ML IV SOLN
INTRAVENOUS | Status: DC
  Administered 2014-10-09: 1.56 mg via INTRAVENOUS
  Administered 2014-10-09: 1.08 mg via INTRAVENOUS
  Administered 2014-10-10: 18:00:00 via INTRAVENOUS
  Administered 2014-10-10: 3.3 mg via INTRAVENOUS
  Administered 2014-10-10: 2 mg via INTRAVENOUS
  Administered 2014-10-10: 04:00:00 via INTRAVENOUS
  Administered 2014-10-10: 0.906 mg via INTRAVENOUS
  Administered 2014-10-11: 08:00:00 via INTRAVENOUS
  Administered 2014-10-11: 2.31 mg via INTRAVENOUS
  Administered 2014-10-11: 1.3 mg via INTRAVENOUS
  Administered 2014-10-11: 2.7 mg via INTRAVENOUS
  Administered 2014-10-11: 3.3 mg via INTRAVENOUS
  Administered 2014-10-11: 2.1 mg via INTRAVENOUS
  Administered 2014-10-11 – 2014-10-12 (×2): 1.5 mg via INTRAVENOUS
  Administered 2014-10-12: 0.3 mg via INTRAVENOUS
  Administered 2014-10-12: 1.5 mg via INTRAVENOUS
  Administered 2014-10-12: 1.8 mg via INTRAVENOUS
  Administered 2014-10-12: 1.2 mg via INTRAVENOUS
  Administered 2014-10-12: 1.8 mg via INTRAVENOUS
  Administered 2014-10-13: 1.2 mg via INTRAVENOUS
  Administered 2014-10-13: 1.8 mg via INTRAVENOUS
  Administered 2014-10-13: 25 mL via INTRAVENOUS
  Administered 2014-10-13 (×2): 0.9 mg via INTRAVENOUS
  Administered 2014-10-13: 1.5 mg via INTRAVENOUS
  Administered 2014-10-13: 0.9 mg via INTRAVENOUS
  Administered 2014-10-14: 08:00:00 via INTRAVENOUS
  Administered 2014-10-14: 0.9 mg via INTRAVENOUS
  Administered 2014-10-14: 1.65 mg via INTRAVENOUS
  Administered 2014-10-14: 1.8 mg via INTRAVENOUS
  Administered 2014-10-14: 1.2 mg via INTRAVENOUS
  Administered 2014-10-14: 1.8 mg via INTRAVENOUS
  Administered 2014-10-14: 2.1 mg via INTRAVENOUS
  Administered 2014-10-15: 0.9 mg via INTRAVENOUS
  Administered 2014-10-15: 1.2 mg via INTRAVENOUS
  Administered 2014-10-15 (×2): 0.3 mg via INTRAVENOUS
  Filled 2014-10-09 (×8): qty 25

## 2014-10-09 MED ORDER — ONDANSETRON HCL 4 MG PO TABS: 4.0000 mg | ORAL_TABLET | Freq: Four times a day (QID) | ORAL | Status: DC | PRN

## 2014-10-09 MED ORDER — LIDOCAINE HCL 1 % IJ SOLN
INTRAMUSCULAR | Status: AC
  Filled 2014-10-09: qty 20

## 2014-10-09 MED ORDER — EPHEDRINE SULFATE 50 MG/ML IJ SOLN
INTRAMUSCULAR | Status: AC
  Filled 2014-10-09: qty 1

## 2014-10-09 MED ORDER — FENTANYL CITRATE (PF) 100 MCG/2ML IJ SOLN
INTRAMUSCULAR | Status: DC | PRN
Start: 2014-10-09 — End: 2014-10-09
  Administered 2014-10-09 (×2): 50 ug via INTRAVENOUS
  Administered 2014-10-09: 100 ug via INTRAVENOUS
  Administered 2014-10-09: 50 ug via INTRAVENOUS

## 2014-10-09 MED ORDER — SUGAMMADEX SODIUM 200 MG/2ML IV SOLN
INTRAVENOUS | Status: DC | PRN
  Administered 2014-10-09: 350 mg via INTRAVENOUS

## 2014-10-09 MED ORDER — HYDROMORPHONE HCL 1 MG/ML IJ SOLN
0.2500 mg | INTRAMUSCULAR | Status: DC | PRN
  Administered 2014-10-09 (×3): 0.5 mg via INTRAVENOUS

## 2014-10-09 MED ORDER — CEFAZOLIN SODIUM-DEXTROSE 2-3 GM-% IV SOLR
INTRAVENOUS | Status: AC
  Filled 2014-10-09: qty 50

## 2014-10-09 MED ORDER — LACTATED RINGERS IV SOLN: INTRAVENOUS | Status: DC

## 2014-10-09 MED ORDER — MIDAZOLAM HCL 5 MG/5ML IJ SOLN
INTRAMUSCULAR | Status: DC | PRN
  Administered 2014-10-09: 2 mg via INTRAVENOUS

## 2014-10-09 MED ORDER — METOCLOPRAMIDE HCL 5 MG/ML IJ SOLN
INTRAMUSCULAR | Status: AC
  Filled 2014-10-09: qty 2

## 2014-10-09 MED ORDER — LIDOCAINE HCL (CARDIAC) 20 MG/ML IV SOLN
INTRAVENOUS | Status: AC
  Filled 2014-10-09: qty 5

## 2014-10-09 MED ORDER — LIDOCAINE HCL (CARDIAC) 20 MG/ML IV SOLN
INTRAVENOUS | Status: DC | PRN
  Administered 2014-10-09: 50 mg via INTRAVENOUS

## 2014-10-09 MED ORDER — SODIUM CHLORIDE 0.9 % IJ SOLN: 9.0000 mL | INTRAMUSCULAR | Status: DC | PRN

## 2014-10-09 MED ORDER — ROCURONIUM BROMIDE 100 MG/10ML IV SOLN
INTRAVENOUS | Status: AC
  Filled 2014-10-09: qty 2

## 2014-10-09 MED ORDER — CEFAZOLIN SODIUM 1-5 GM-% IV SOLN
1.0000 g | Freq: Four times a day (QID) | INTRAVENOUS | Status: AC
  Administered 2014-10-09 – 2014-10-10 (×3): 1 g via INTRAVENOUS
  Filled 2014-10-09 (×3): qty 50

## 2014-10-09 MED ORDER — DIPHENHYDRAMINE HCL 12.5 MG/5ML PO ELIX: 12.5000 mg | ORAL_SOLUTION | Freq: Four times a day (QID) | ORAL | Status: DC | PRN

## 2014-10-09 MED ORDER — BUPIVACAINE 0.25 % ON-Q PUMP DUAL CATH 300 ML
300.0000 mL | INJECTION | Status: DC
  Administered 2014-10-09: 300 mL
  Filled 2014-10-09: qty 300

## 2014-10-09 MED ORDER — PHENYLEPHRINE HCL 10 MG/ML IJ SOLN
INTRAMUSCULAR | Status: DC | PRN
  Administered 2014-10-09: 160 ug via INTRAVENOUS
  Administered 2014-10-09 (×3): 80 ug via INTRAVENOUS

## 2014-10-09 MED ORDER — PANTOPRAZOLE SODIUM 40 MG IV SOLR
40.0000 mg | Freq: Every day | INTRAVENOUS | Status: DC
  Administered 2014-10-09 – 2014-10-15 (×7): 40 mg via INTRAVENOUS
  Filled 2014-10-09 (×10): qty 40

## 2014-10-09 MED ORDER — METOPROLOL TARTRATE 1 MG/ML IV SOLN: 5.0000 mg | Freq: Four times a day (QID) | INTRAVENOUS | Status: DC

## 2014-10-09 MED ORDER — ALBUTEROL SULFATE (2.5 MG/3ML) 0.083% IN NEBU
INHALATION_SOLUTION | RESPIRATORY_TRACT | Status: AC
  Filled 2014-10-09: qty 3

## 2014-10-09 MED ORDER — ALBUTEROL SULFATE (2.5 MG/3ML) 0.083% IN NEBU
2.5000 mg | INHALATION_SOLUTION | Freq: Once | RESPIRATORY_TRACT | Status: AC
  Administered 2014-10-09: 2.5 mg via RESPIRATORY_TRACT

## 2014-10-09 MED ORDER — ALBUTEROL SULFATE (2.5 MG/3ML) 0.083% IN NEBU
2.5000 mg | INHALATION_SOLUTION | Freq: Two times a day (BID) | RESPIRATORY_TRACT | Status: DC
  Administered 2014-10-09 (×2): 2.5 mg via RESPIRATORY_TRACT
  Filled 2014-10-09 (×3): qty 3

## 2014-10-09 MED ORDER — LACTATED RINGERS IV SOLN
INTRAVENOUS | Status: DC | PRN
  Administered 2014-10-09: 11:00:00 via INTRAVENOUS

## 2014-10-09 MED ORDER — 0.9 % SODIUM CHLORIDE (POUR BTL) OPTIME
TOPICAL | Status: DC | PRN
  Administered 2014-10-09: 4000 mL

## 2014-10-09 MED ORDER — ONDANSETRON HCL 4 MG/2ML IJ SOLN: 4.0000 mg | Freq: Four times a day (QID) | INTRAMUSCULAR | Status: DC | PRN

## 2014-10-09 MED ORDER — ACETAMINOPHEN 10 MG/ML IV SOLN
1000.0000 mg | Freq: Four times a day (QID) | INTRAVENOUS | Status: AC
  Administered 2014-10-09 – 2014-10-10 (×4): 1000 mg via INTRAVENOUS
  Filled 2014-10-09 (×3): qty 100

## 2014-10-09 MED ORDER — ONDANSETRON HCL 4 MG/2ML IJ SOLN
INTRAMUSCULAR | Status: DC | PRN
  Administered 2014-10-09: 4 mg via INTRAVENOUS

## 2014-10-09 MED ORDER — HYDROMORPHONE HCL 1 MG/ML IJ SOLN
INTRAMUSCULAR | Status: DC | PRN
  Administered 2014-10-09: 1 mg via INTRAVENOUS
  Administered 2014-10-09: 0.5 mg via INTRAVENOUS

## 2014-10-09 MED ORDER — BUPIVACAINE ON-Q PAIN PUMP (FOR ORDER SET NO CHG)
INJECTION | Status: AC
  Filled 2014-10-09: qty 1

## 2014-10-09 MED ORDER — FENTANYL CITRATE 0.05 MG/ML IJ SOLN
25.0000 ug | INTRAMUSCULAR | Status: DC | PRN
  Filled 2014-10-09: qty 1

## 2014-10-09 MED ORDER — PROMETHAZINE HCL 25 MG/ML IJ SOLN: 6.2500 mg | INTRAMUSCULAR | Status: DC | PRN

## 2014-10-09 MED ORDER — PROPOFOL 10 MG/ML IV BOLUS
INTRAVENOUS | Status: DC | PRN
  Administered 2014-10-09: 200 mg via INTRAVENOUS

## 2014-10-09 MED ORDER — ROCURONIUM BROMIDE 100 MG/10ML IV SOLN
INTRAVENOUS | Status: AC
  Filled 2014-10-09: qty 1

## 2014-10-09 MED ORDER — ONDANSETRON HCL 4 MG/2ML IJ SOLN
INTRAMUSCULAR | Status: AC
  Filled 2014-10-09: qty 2

## 2014-10-09 MED ORDER — HYDROMORPHONE HCL 1 MG/ML IJ SOLN
INTRAMUSCULAR | Status: AC
  Filled 2014-10-09: qty 1

## 2014-10-09 MED ORDER — EVICEL 5 ML EX KIT
PACK | CUTANEOUS | Status: DC | PRN
  Administered 2014-10-09: 1

## 2014-10-09 MED ORDER — HYDROMORPHONE HCL 2 MG/ML IJ SOLN
INTRAMUSCULAR | Status: AC
  Filled 2014-10-09: qty 1

## 2014-10-09 MED ORDER — LACTATED RINGERS IV SOLN
INTRAVENOUS | Status: DC | PRN
  Administered 2014-10-09 (×3): via INTRAVENOUS

## 2014-10-09 MED ORDER — PHENYLEPHRINE 40 MCG/ML (10ML) SYRINGE FOR IV PUSH (FOR BLOOD PRESSURE SUPPORT)
PREFILLED_SYRINGE | INTRAVENOUS | Status: AC
  Filled 2014-10-09: qty 10

## 2014-10-09 MED ORDER — DEXTROSE-NACL 5-0.45 % IV SOLN
INTRAVENOUS | Status: DC
  Administered 2014-10-09 – 2014-10-11 (×4): via INTRAVENOUS
  Administered 2014-10-11 – 2014-10-12 (×2): 75 mL/h via INTRAVENOUS
  Administered 2014-10-13 – 2014-10-16 (×2): via INTRAVENOUS
  Administered 2014-10-17: 50 mL/h via INTRAVENOUS

## 2014-10-09 MED ORDER — BUPIVACAINE-EPINEPHRINE 0.25% -1:200000 IJ SOLN
INTRAMUSCULAR | Status: DC | PRN
  Administered 2014-10-09: 10 mL

## 2014-10-09 MED ORDER — ALBUMIN HUMAN 5 % IV SOLN
25.0000 g | Freq: Once | INTRAVENOUS | Status: AC
  Administered 2014-10-09: 25 g via INTRAVENOUS
  Filled 2014-10-09: qty 500

## 2014-10-09 MED ORDER — EPHEDRINE SULFATE 50 MG/ML IJ SOLN
INTRAMUSCULAR | Status: DC | PRN
  Administered 2014-10-09 (×2): 10 mg via INTRAVENOUS

## 2014-10-09 MED ORDER — HYDROMORPHONE 0.3 MG/ML IV SOLN
INTRAVENOUS | Status: AC
  Administered 2014-10-09: 13:00:00
  Filled 2014-10-09: qty 25

## 2014-10-09 MED ORDER — DEXAMETHASONE SODIUM PHOSPHATE 4 MG/ML IJ SOLN
INTRAMUSCULAR | Status: DC | PRN
  Administered 2014-10-09: 10 mg via INTRAVENOUS

## 2014-10-09 MED ORDER — HYDRALAZINE HCL 20 MG/ML IJ SOLN
20.0000 mg | INTRAMUSCULAR | Status: DC | PRN
  Administered 2014-10-10: 20 mg via INTRAVENOUS
  Filled 2014-10-09: qty 1

## 2014-10-09 MED ORDER — LIDOCAINE HCL 1 % IJ SOLN
INTRAMUSCULAR | Status: DC | PRN
Start: 2014-10-09 — End: 2014-10-09
  Administered 2014-10-09: 10 mL

## 2014-10-09 MED ORDER — TISSEEL VH 10 ML EX KIT
PACK | CUTANEOUS | Status: AC
  Filled 2014-10-09: qty 1

## 2014-10-09 MED ORDER — ONDANSETRON HCL 4 MG/2ML IJ SOLN
4.0000 mg | Freq: Four times a day (QID) | INTRAMUSCULAR | Status: DC | PRN
  Administered 2014-10-12: 4 mg via INTRAVENOUS
  Filled 2014-10-09: qty 2

## 2014-10-09 MED ORDER — FENTANYL CITRATE (PF) 250 MCG/5ML IJ SOLN
INTRAMUSCULAR | Status: AC
  Filled 2014-10-09: qty 5

## 2014-10-09 MED ORDER — ALBUTEROL SULFATE HFA 108 (90 BASE) MCG/ACT IN AERS
INHALATION_SPRAY | RESPIRATORY_TRACT | Status: AC
  Filled 2014-10-09: qty 6.7

## 2014-10-09 MED ORDER — HYDROMORPHONE HCL 1 MG/ML IJ SOLN
0.5000 mg | INTRAMUSCULAR | Status: DC | PRN
  Administered 2014-10-13: 1 mg via INTRAVENOUS
  Filled 2014-10-09: qty 1

## 2014-10-09 MED ORDER — LACTATED RINGERS IR SOLN
Status: DC | PRN
  Administered 2014-10-09: 1000 mL

## 2014-10-09 MED ORDER — CEFAZOLIN SODIUM-DEXTROSE 2-3 GM-% IV SOLR
2.0000 g | INTRAVENOUS | Status: AC
  Administered 2014-10-09 (×2): 2 g via INTRAVENOUS

## 2014-10-09 MED ORDER — MIDAZOLAM HCL 2 MG/2ML IJ SOLN
INTRAMUSCULAR | Status: AC
  Filled 2014-10-09: qty 2

## 2014-10-09 MED ORDER — METOPROLOL TARTRATE 25 MG PO TABS
25.0000 mg | ORAL_TABLET | Freq: Two times a day (BID) | ORAL | Status: DC
  Filled 2014-10-09: qty 1

## 2014-10-09 MED ORDER — ROCURONIUM BROMIDE 100 MG/10ML IV SOLN
INTRAVENOUS | Status: DC | PRN
  Administered 2014-10-09: 10 mg via INTRAVENOUS
  Administered 2014-10-09 (×2): 20 mg via INTRAVENOUS
  Administered 2014-10-09: 10 mg via INTRAVENOUS
  Administered 2014-10-09: 60 mg via INTRAVENOUS
  Administered 2014-10-09 (×3): 20 mg via INTRAVENOUS

## 2014-10-09 MED ORDER — BUPIVACAINE-EPINEPHRINE (PF) 0.25% -1:200000 IJ SOLN
INTRAMUSCULAR | Status: AC
  Filled 2014-10-09: qty 30

## 2014-10-09 SURGICAL SUPPLY — 120 items
APPLICATOR DUAL LIQUID (MISCELLANEOUS) IMPLANT
APPLIER CLIP 5 13 M/L LIGAMAX5 (MISCELLANEOUS)
APPLIER CLIP ROT 10 11.4 M/L (STAPLE)
BINDER ABDOMINAL 12 ML 46-62 (SOFTGOODS) IMPLANT
BLADE EXTENDED COATED 6.5IN (ELECTRODE) ×3 IMPLANT
BLADE HEX COATED 2.75 (ELECTRODE) ×3 IMPLANT
BLADE SURG SZ10 CARB STEEL (BLADE) ×3 IMPLANT
CABLE HIGH FREQUENCY MONO STRZ (ELECTRODE) ×3 IMPLANT
CATH KIT ON-Q SILVERSOAK 7.5IN (CATHETERS) ×6 IMPLANT
CHLORAPREP W/TINT 26ML (MISCELLANEOUS) ×3 IMPLANT
CLIP APPLIE 5 13 M/L LIGAMAX5 (MISCELLANEOUS) IMPLANT
CLIP APPLIE ROT 10 11.4 M/L (STAPLE) IMPLANT
CLIP LIGATING HEM O LOK PURPLE (MISCELLANEOUS) ×6 IMPLANT
CLIP LIGATING HEMO O LOK GREEN (MISCELLANEOUS) ×3 IMPLANT
CLIP TI LARGE 6 (CLIP) ×9 IMPLANT
CLIP TI MEDIUM 6 (CLIP) ×3 IMPLANT
CLIP TI MEDIUM LARGE 6 (CLIP) ×3 IMPLANT
CLOSURE WOUND 1/2 X4 (GAUZE/BANDAGES/DRESSINGS)
CONNECTOR 5 IN 1 STRAIGHT STRL (MISCELLANEOUS) ×3 IMPLANT
COVER MAYO STAND STRL (DRAPES) ×3 IMPLANT
CUTTER FLEX LINEAR 45M (STAPLE) IMPLANT
DECANTER SPIKE VIAL GLASS SM (MISCELLANEOUS) ×3 IMPLANT
DEVICE PMI PUNCTURE CLOSURE (MISCELLANEOUS) IMPLANT
DEVICE TROCAR PUNCTURE CLOSURE (ENDOMECHANICALS) IMPLANT
DRAIN CHANNEL 19F RND (DRAIN) ×3 IMPLANT
DRAIN CHANNEL RND F F (WOUND CARE) IMPLANT
DRAPE INCISE IOBAN 66X45 STRL (DRAPES) IMPLANT
DRAPE LAPAROSCOPIC ABDOMINAL (DRAPES) ×3 IMPLANT
DRAPE SHEET LG 3/4 BI-LAMINATE (DRAPES) IMPLANT
DRAPE UTILITY XL STRL (DRAPES) ×3 IMPLANT
DRAPE WARM FLUID 44X44 (DRAPE) ×3 IMPLANT
DRESSING TELFA ISLAND 4X8 (GAUZE/BANDAGES/DRESSINGS) IMPLANT
DRSG PAD ABDOMINAL 8X10 ST (GAUZE/BANDAGES/DRESSINGS) IMPLANT
DRSG TEGADERM 4X4.75 (GAUZE/BANDAGES/DRESSINGS) ×3 IMPLANT
DRSG TELFA 4X10 ISLAND STR (GAUZE/BANDAGES/DRESSINGS) IMPLANT
DRSG TELFA PLUS 4X6 ADH ISLAND (GAUZE/BANDAGES/DRESSINGS) IMPLANT
ELECT L-HOOK LAP 45CM DISP (ELECTROSURGICAL) ×3
ELECT REM PT RETURN 9FT ADLT (ELECTROSURGICAL) ×3
ELECTRODE L-HOOK LAP 45CM DISP (ELECTROSURGICAL) ×1 IMPLANT
ELECTRODE REM PT RTRN 9FT ADLT (ELECTROSURGICAL) ×1 IMPLANT
ENDOLOOP SUT PDS II  0 18 (SUTURE)
ENDOLOOP SUT PDS II 0 18 (SUTURE) IMPLANT
EVACUATOR SILICONE 100CC (DRAIN) ×3 IMPLANT
FILTER SMOKE EVAC LAPAROSHD (FILTER) ×3 IMPLANT
GAUZE SPONGE 4X4 12PLY STRL (GAUZE/BANDAGES/DRESSINGS) ×3 IMPLANT
GLOVE BIO SURGEON STRL SZ 6 (GLOVE) ×3 IMPLANT
GLOVE INDICATOR 6.5 STRL GRN (GLOVE) ×3 IMPLANT
GOWN STRL REUS W/ TWL XL LVL3 (GOWN DISPOSABLE) ×3 IMPLANT
GOWN STRL REUS W/TWL XL LVL3 (GOWN DISPOSABLE) ×6
HOLDER FOLEY CATH W/STRAP (MISCELLANEOUS) ×3 IMPLANT
HOVERMATT SINGLE USE (MISCELLANEOUS) ×3 IMPLANT
KIT BASIN OR (CUSTOM PROCEDURE TRAY) ×3 IMPLANT
LIQUID BAND (GAUZE/BANDAGES/DRESSINGS) IMPLANT
MARKER SKIN DUAL TIP RULER LAB (MISCELLANEOUS) ×3 IMPLANT
NEEDLE BIOPSY 14GX4.5 SOFT TIS (NEEDLE) IMPLANT
NEEDLE BIOPSY 14X6 SOFT TISS (NEEDLE) IMPLANT
PACK UNIVERSAL I (CUSTOM PROCEDURE TRAY) ×3 IMPLANT
PENCIL BUTTON HOLSTER BLD 10FT (ELECTRODE) ×3 IMPLANT
RELOAD 45 VASCULAR/THIN (ENDOMECHANICALS) IMPLANT
RELOAD STAPLE TA45 3.5 REG BLU (ENDOMECHANICALS) IMPLANT
RELOAD STAPLER BLUE 60MM (STAPLE) IMPLANT
RELOAD STAPLER GOLD 60MM (STAPLE) IMPLANT
RELOAD STAPLER WHITE 60MM (STAPLE) ×3 IMPLANT
SCISSORS LAP 5X35 DISP (ENDOMECHANICALS) ×3 IMPLANT
SET IRRIG TUBING LAPAROSCOPIC (IRRIGATION / IRRIGATOR) ×3 IMPLANT
SHEARS FOC LG CVD HARMONIC 17C (MISCELLANEOUS) IMPLANT
SHEARS HARMONIC ACE PLUS 36CM (ENDOMECHANICALS) ×3 IMPLANT
SLEEVE SURGEON STRL (DRAPES) ×3 IMPLANT
SLEEVE XCEL OPT CAN 5 100 (ENDOMECHANICALS) ×3 IMPLANT
SOLUTION ANTI FOG 6CC (MISCELLANEOUS) IMPLANT
SPONGE LAP 18X18 X RAY DECT (DISPOSABLE) ×18 IMPLANT
STAPLE ECHEON FLEX 60 POW ENDO (STAPLE) IMPLANT
STAPLER RELOAD BLUE 60MM (STAPLE)
STAPLER RELOAD GOLD 60MM (STAPLE)
STAPLER RELOAD WHITE 60MM (STAPLE) ×9
STAPLER VISISTAT 35W (STAPLE) ×3 IMPLANT
STRIP CLOSURE SKIN 1/2X4 (GAUZE/BANDAGES/DRESSINGS) IMPLANT
SUCTION POOLE TIP (SUCTIONS) IMPLANT
SUT ETHILON 2 0 PS N (SUTURE) ×3 IMPLANT
SUT MNCRL AB 4-0 PS2 18 (SUTURE) ×3 IMPLANT
SUT PDS AB 1 TP1 54 (SUTURE) IMPLANT
SUT PDS AB 1 TP1 96 (SUTURE) ×9 IMPLANT
SUT PROLENE 0 CT 1 CR/8 (SUTURE) IMPLANT
SUT PROLENE 3 0 SH1 36 (SUTURE) IMPLANT
SUT SILK 2 0 (SUTURE) ×2
SUT SILK 2 0 SH (SUTURE) ×15 IMPLANT
SUT SILK 2 0 SH CR/8 (SUTURE) ×3 IMPLANT
SUT SILK 2-0 18XBRD TIE 12 (SUTURE) IMPLANT
SUT SILK 2-0 30XBRD TIE 12 (SUTURE) ×1 IMPLANT
SUT SILK 3 0 (SUTURE) ×2
SUT SILK 3 0 SH CR/8 (SUTURE) IMPLANT
SUT SILK 3-0 18XBRD TIE 12 (SUTURE) ×1 IMPLANT
SUT VIC AB 3-0 SH 18 (SUTURE) IMPLANT
SUT VIC AB 4-0 SH 18 (SUTURE) IMPLANT
SUT VICRYL 0 ENDOLOOP (SUTURE) IMPLANT
SUT VICRYL 0 UR6 27IN ABS (SUTURE) ×3 IMPLANT
SUT VICRYL 2 0 18  UND BR (SUTURE)
SUT VICRYL 2 0 18 UND BR (SUTURE) IMPLANT
SUT VICRYL 3 0 BR 18  UND (SUTURE)
SUT VICRYL 3 0 BR 18 UND (SUTURE) IMPLANT
SYR BULB IRRIGATION 50ML (SYRINGE) IMPLANT
SYS LAPSCP GELPORT 120MM (MISCELLANEOUS) ×3
SYSTEM LAPSCP GELPORT 120MM (MISCELLANEOUS) ×1 IMPLANT
TACKER 5MM HERNIA 3.5CML NAB (ENDOMECHANICALS) IMPLANT
TOWEL OR 17X26 10 PK STRL BLUE (TOWEL DISPOSABLE) ×3 IMPLANT
TOWEL OR NON WOVEN STRL DISP B (DISPOSABLE) ×6 IMPLANT
TRAY FOLEY W/METER SILVER 14FR (SET/KITS/TRAYS/PACK) IMPLANT
TRAY FOLEY W/METER SILVER 16FR (SET/KITS/TRAYS/PACK) ×3 IMPLANT
TRAY LAPAROSCOPIC (CUSTOM PROCEDURE TRAY) ×3 IMPLANT
TROCAR BLADELESS OPT 5 100 (ENDOMECHANICALS) ×3 IMPLANT
TROCAR XCEL 12X100 BLDLESS (ENDOMECHANICALS) IMPLANT
TROCAR XCEL BLUNT TIP 100MML (ENDOMECHANICALS) ×3 IMPLANT
TROCAR XCEL NON-BLD 11X100MML (ENDOMECHANICALS) IMPLANT
TUBE FEEDING 5FR 36IN KANGAROO (TUBING) IMPLANT
TUBING CONNECTING 10 (TUBING) ×2 IMPLANT
TUBING CONNECTING 10' (TUBING) ×1
TUBING FILTER THERMOFLATOR (ELECTROSURGICAL) ×3 IMPLANT
TUBING INSUFFLATION 10FT LAP (TUBING) ×3 IMPLANT
TUNNELER SHEATH ON-Q 16GX12 DP (PAIN MANAGEMENT) ×3 IMPLANT
YANKAUER SUCT BULB TIP 10FT TU (MISCELLANEOUS) ×3 IMPLANT

## 2014-10-09 NOTE — Op Note (Signed)
PREOPERATIVE DIAGNOSIS:  Irregular pancreatic cystic mass.      POSTOPERATIVE DIAGNOSIS:  Same      PROCEDURE:  laparoscopic hand-assisted converted to open distal   pancreatectomy and splenectomy, umbilical hernia repair.      SURGEON:  [name], MD      ASSISTANT:  Armandina Gemma, M.D.      ANESTHESIA:  General and local.      FINDINGS:  Significant inflammatory reaction around pancreatic mass, mass 8 cm.  Dense adhesions to splenic vein and artery.      SPECIMEN:  Distal pancreas, spleen and some omentum to Pathology.      ESTIMATED BLOOD LOSS:  270 mL      COMPLICATIONS:  None known.      PROCEDURE:  Patient was identified in the holding area and taken to   operating room where he was placed supine on the operating room table.   General anesthesia was induced.  Foley catheter was placed.  He was   placed in the leaning spleen position.  The abdomen was prepped and   draped in sterile fashion.  Time-out was performed according to surgical   safety check list.  When all was correct, we continued.    The supraumbilical skin was anesthetized with local anesthetic.  A vertical 1.5 cm incision   was made with #11 blade.  The subcutaneous tissues were spread with a   Kelly clamp and the umbilical fascia was elevated with 2 Kochers.   Fascia was incised in the midline with 11 blade.  The umbilical hernia was dissected out and a 0 Vicryl   pursestring suture was placed around the fascial incision.  The tails of   the suture were used to hold the Hasson trocar and placed in the   abdominal cavity.  Pneumoperitoneum was achieved.        A 5 mm  port was placed in the left upper quadrant and then 3 were placed in the left lower quadrant as well.  These were  all 5 mm trocars.  The lienocolic ligament was   taken down with the Harmonic.  The lesser sac was opened with the   Harmonic and all the adhesions were taken down.  There were significant adhesions from the posterior stomach to  the pancreas.  Once the stomach was completely off the spleen, the inferior border of the   pancreas was identified and this was opened up with the Harmonic.  The mass was large and adherent.  Because of this, a hand port was placed in the LUQ, incorporating one of the port sites.    The pancreatic tail including the mass and the spleen were mobilized.  The dense inflammatory tissue was near the splenic artery takeoff, so the hand port was extended to the midline, and up to the xiphoid.  The splenic vein did bleed with mobilization and had to be suture ligated. Several branches from the splenic artery to the pancreas were also bleeding.   The splenic artery was also suture ligated and clipped.  The blood loss was principally from the area.  The spleen was then taken from the end of the pancreas in order to facilitate removal of the mass.  The fatty tissue superior and inferior to the pancreas was divided with the harmonic.  Eventually, the pancreas was identified proximal to the mass.  The was compressed, then stapled with a white load of the Echelon stapler.  The pancreas was not bleeding.  The  LUQ was irrigated and reinspected for hemostasis.  At that point, tisseel was placed on the pancreatic stump.  The omentum was placed over the top of this.      The 19 Blake drain pulled out through one of the lateral 5 mm trocars.   This was placed in the appropriate location.  The umbilical hernia was repaired with 2 figure of 8 0-0 vicryl sutures.  The OnQ tunnelers were placed on either side of the incision.  The fascia was closed in 2 layers on the subcostal part and 1 layer in the midline.  The skin was irrigated and closed with staples.  Soft dressings were applied.      The patient tolerated   the procedure well.  He was extubated and taken to the PACU in stable   condition.  Needle, sponge, and instrument counts were correct x2               [name], MD

## 2014-10-09 NOTE — Plan of Care (Signed)
Problem: Phase II Progression Outcomes Goal: Pain controlled Outcome: Progressing Patient on PCA Dilaudid

## 2014-10-09 NOTE — H&P (Signed)
Dalton Morris 09/15/2014 8:50 AM Location: [facility] Patient #: [id number] DOB: Nov 05, 1955 Single / Language: Dalton Morris / Race: White Male  History of Present Illness Dalton Klein MD; 09/15/2014 10:14 AM) Patient words: panc. cyst.  The patient is a 59 year old male who presents with a pancreatic mass. Patient is 59 year old male who presents with approximately 4-6 weeks of worsening abdominal pain and back pain. He is referred for consultation by Dalton Morris for new diagnosis of a pancreatic cystic mass. He went to the emergency department having a CT scan was done which demonstrated a 8 cm mass in the body and tail of the pancreas. This was described as a complex cystic mass with some calcifications. He has no history of pancreatitis of which he is aware. He does not have any family history of abdominal cancers or breast cancer that he is aware of. He denies any diarrhea or hyperglycemia. He has had his labs checked several times in the past 6 weeks and has not had any high glucose level that he is aware of. He didn't really go to the doctor at all until around 3-4 years ago. He has actively been working on quitting smoking. He is down to less than a half a pack per day. He denies any chest pain or shortness of breath. He does have pretty much constant back pain. His abdominal pain will ease off a little bit but is worse whenever he eats. He is having so much discomfort, he has difficulty walking to the mailbox.   Other Problems Dalton Morris, CMA; 09/15/2014 8:50 AM) Back Pain Chronic Obstructive Lung Disease High blood pressure Home Oxygen Use Myocardial infarction Umbilical Hernia Repair  Past Surgical History Dalton Morris, CMA; 09/15/2014 8:50 AM) No pertinent past surgical history  Diagnostic Studies History Dalton Morris, CMA; 09/15/2014 8:50 AM) Colonoscopy 1-5 years ago  Allergies Dalton Morris, CMA; 09/15/2014 8:51 AM) Codeine Phosphate *ANALGESICS -  OPIOID*  Medication History (Dalton Morris, CMA; 09/15/2014 8:53 AM) Metoprolol Tartrate (25MG  Tablet, Oral) Active. Albuterol Sulfate (0.63MG /3ML Nebulized Soln, Inhalation) Active. NexIUM (40MG  Capsule DR, Oral) Active. Advil (200MG  Capsule, Oral) Active. Medications Reconciled  Social History Dalton Morris, CMA; 09/15/2014 8:50 AM) Alcohol use Moderate alcohol use. Caffeine use Carbonated beverages. No drug use Tobacco use Current some day smoker.  Family History Dalton Morris, CMA; 09/15/2014 8:50 AM) Diabetes Mellitus Mother. Hypertension Brother, Father. Seizure disorder Mother. Thyroid problems Mother.  Review of Systems (Dalton Morris; 09/15/2014 8:50 AM) General Present- Fatigue. Not Present- Appetite Loss, Chills, Fever, Night Sweats, Weight Gain and Weight Loss. HEENT Present- Visual Disturbances. Not Present- Earache, Hearing Loss, Hoarseness, Nose Bleed, Oral Ulcers, Ringing in the Ears, Seasonal Allergies, Sinus Pain, Sore Throat, Wears glasses/contact lenses and Yellow Eyes. Respiratory Present- Difficulty Breathing and Wheezing. Not Present- Bloody sputum, Chronic Cough and Snoring. Breast Not Present- Breast Mass, Breast Pain, Nipple Discharge and Skin Changes. Cardiovascular Present- Difficulty Breathing Lying Down and Shortness of Breath. Not Present- Chest Pain, Leg Cramps, Palpitations, Rapid Heart Rate and Swelling of Extremities. Gastrointestinal Present- Bloating. Not Present- Abdominal Pain, Bloody Stool, Change in Bowel Habits, Chronic diarrhea, Constipation, Difficulty Swallowing, Excessive gas, Gets full quickly at meals, Hemorrhoids, Indigestion, Nausea, Rectal Pain and Vomiting. Male Genitourinary Not Present- Blood in Urine, Change in Urinary Stream, Frequency, Impotence, Nocturia, Painful Urination, Urgency and Urine Leakage. Musculoskeletal Present- Back Pain. Not Present- Joint Pain, Joint Stiffness, Muscle Pain, Muscle Weakness and Swelling of  Extremities. Neurological Present- Weakness. Not Present- Decreased  Memory, Fainting, Headaches, Numbness, Seizures, Tingling, Tremor and Trouble walking. Psychiatric Not Present- Anxiety, Bipolar, Change in Sleep Pattern, Depression, Fearful and Frequent crying. Endocrine Present- Heat Intolerance. Not Present- Cold Intolerance, Excessive Hunger, Hair Changes, Hot flashes and New Diabetes. Hematology Not Present- Easy Bruising, Excessive bleeding, Gland problems, HIV and Persistent Infections.   Vitals (Dalton Morris CMA; 09/15/2014 8:51 AM) 09/15/2014 8:50 AM Weight: 253.2 lb Height: 74in Body Surface Area: 2.45 m Body Mass Index: 32.51 kg/m Temp.: 98.46F(Temporal)  Pulse: 88 (Regular)  BP: 124/80 (Sitting, Left Arm, Standard)    Physical Exam Dalton Klein MD; 09/15/2014 10:16 AM) General Mental Status-Alert. General Appearance-Consistent with stated age. Hydration-Well hydrated. Voice-Normal.  Head and Neck Head-normocephalic, atraumatic with no lesions or palpable masses. Trachea-midline. Thyroid Gland Characteristics - normal size and consistency.  Eye Eyeball - Bilateral-Extraocular movements intact. Sclera/Conjunctiva - Bilateral-No scleral icterus.  Chest and Lung Exam Chest and lung exam reveals -quiet, even and easy respiratory effort with no use of accessory muscles and on auscultation, normal breath sounds, no adventitious sounds and normal vocal resonance. Inspection Chest Wall - Normal. Back - normal.  Cardiovascular Cardiovascular examination reveals -normal heart sounds, regular rate and rhythm with no murmurs and normal pedal pulses bilaterally. Note: No audible carotid bruits.   Abdomen Palpation/Percussion Palpation and Percussion of the abdomen reveal - Soft, Non Tender, No Rebound tenderness, No Rigidity (guarding) and No hepatosplenomegaly. Auscultation Auscultation of the abdomen reveals - Bowel sounds normal. Note:  Umbilical hernia is present. Abdomen is slightly protuberant.   Neurologic Neurologic evaluation reveals -alert and oriented x 3 with no impairment of recent or remote memory. Mental Status-Normal.  Musculoskeletal Global Assessment -Note: no gross deformities.  Normal Exam - Left-Upper Extremity Strength Normal and Lower Extremity Strength Normal. Normal Exam - Right-Upper Extremity Strength Normal and Lower Extremity Strength Normal.  Lymphatic Head & Neck  General Head & Neck Lymphatics: Bilateral - Description - Normal. Axillary  General Axillary Region: Bilateral - Description - Normal. Tenderness - Non Tender. Femoral & Inguinal  Generalized Femoral & Inguinal Lymphatics: Bilateral - Description - No Generalized lymphadenopathy.    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 There is a risk that this is premalignant. It does need to be removed. It is extremely likely that this mass is the cause of his back pain and his abdominal pain.  I have encouraged the patient to see a new primary care physician and get an eye exam prior to surgery. I also discussed smoking cessation and its role with improved wound healing and decreased complications.  I discussed the risk of bleeding, infection, damage to adjacent structures, possible need for splenectomy, possible pancreatic leak, possible heart-lung complications, possible blood clot, possible cancer, and others. I would repair his umbilical hernia at the time of surgery.  He understands and wishes to proceed.  45 min spent in visit with >50% spent in counseling. Current Plans  Schedule for Surgery Pt Education - FB pancreatectomy Instructions:

## 2014-10-09 NOTE — Transfer of Care (Signed)
Immediate Anesthesia Transfer of Care Note  Patient: Dalton Morris  Procedure(s) Performed: Procedure(s): LAPAROSCOPIC CONVERTED TO OPEN PANCREATECTOMY, SPLENECTOMY, REPAIR UMBILICAL HERNIA (N/A)  UMBILICAL HERNIA REPAIR  (N/A)  Patient Location: PACU  Anesthesia Type:General  Level of Consciousness: awake  Airway & Oxygen Therapy: Patient Spontanous Breathing and Patient connected to face mask oxygen  Post-op Assessment: Report given to RN and Post -op Vital signs reviewed and stable  Post vital signs: Reviewed and stable  Last Vitals:  Filed Vitals:   10/09/14 1308  BP: 95/35  Pulse: 115  Temp:   Resp: 25    Complications: No apparent anesthesia complications

## 2014-10-09 NOTE — Interval H&P Note (Signed)
History and Physical Interval Note:  10/09/2014 7:36 AM  Dalton Morris  has presented today for surgery, with the diagnosis of pancreatic cystic mass, umbilical hernia  The various methods of treatment have been discussed with the patient and family. After consideration of risks, benefits and other options for treatment, the patient has consented to  Procedure(s): LAPAROSCOPIC PANCREATECTOMY,POSSIBLE SPLENECTOMY,  (N/A) LAPAROSCOPIC UMBILICAL HERNIA REPAIR WITH MESH (N/A) INSERTION OF MESH (N/A) as a surgical intervention .  The patient's history has been reviewed, patient examined, no change in status, stable for surgery.  I have reviewed the patient's chart and labs.  Questions were answered to the patient's satisfaction.     [name]

## 2014-10-09 NOTE — Anesthesia Postprocedure Evaluation (Signed)
  Anesthesia Post-op Note  Patient: Dalton Morris  Procedure(s) Performed: Procedure(s) (LRB): LAPAROSCOPIC CONVERTED TO OPEN PANCREATECTOMY, SPLENECTOMY, REPAIR UMBILICAL HERNIA (N/A)  UMBILICAL HERNIA REPAIR  (N/A)  Patient Location: PACU  Anesthesia Type: General  Level of Consciousness: awake and alert   Airway and Oxygen Therapy: Patient Spontanous Breathing  Post-op Pain: mild  Post-op Assessment: Post-op Vital signs reviewed, Patient's Cardiovascular Status Stable, Respiratory Function Stable, Patent Airway and No signs of Nausea or vomiting  Last Vitals:  Filed Vitals:   10/09/14 1405  BP: 91/65  Pulse: 108  Temp: 36.9 C  Resp: 16    Post-op Vital Signs: stable   Complications: No apparent anesthesia complications

## 2014-10-09 NOTE — Progress Notes (Signed)
Patient has COPD. He is wheezing this am.. Did not use his nebulizer at home today, Also coughing up mucus which is normal for him in morning.. Not on front of chart to anesth.

## 2014-10-09 NOTE — Anesthesia Procedure Notes (Signed)
Procedure Name: Intubation Date/Time: 10/09/2014 7:42 AM Performed by: Deliah Boston Pre-anesthesia Checklist: Patient identified, Emergency Drugs available, Suction available and Patient being monitored Patient Re-evaluated:Patient Re-evaluated prior to inductionOxygen Delivery Method: Circle System Utilized Preoxygenation: Pre-oxygenation with 100% oxygen Intubation Type: IV induction Ventilation: Mask ventilation without difficulty Laryngoscope Size: Mac and 4 Grade View: Grade I Tube type: Oral Tube size: 8.0 mm Number of attempts: 1 Airway Equipment and Method: Oral airway Placement Confirmation: ETT inserted through vocal cords under direct vision,  positive ETCO2 and breath sounds checked- equal and bilateral Secured at: 22 cm Tube secured with: Tape Dental Injury: Teeth and Oropharynx as per pre-operative assessment

## 2014-10-10 ENCOUNTER — Inpatient Hospital Stay (HOSPITAL_COMMUNITY): Payer: Medicare Other

## 2014-10-10 ENCOUNTER — Encounter (HOSPITAL_COMMUNITY): Payer: Self-pay | Admitting: Pulmonary Disease

## 2014-10-10 DIAGNOSIS — J449 Chronic obstructive pulmonary disease, unspecified: Secondary | ICD-10-CM

## 2014-10-10 DIAGNOSIS — R061 Stridor: Secondary | ICD-10-CM

## 2014-10-10 DIAGNOSIS — N179 Acute kidney failure, unspecified: Secondary | ICD-10-CM

## 2014-10-10 DIAGNOSIS — R062 Wheezing: Secondary | ICD-10-CM

## 2014-10-10 DIAGNOSIS — R0902 Hypoxemia: Secondary | ICD-10-CM

## 2014-10-10 LAB — PROTIME-INR
INR: 1.07 (ref 0.00–1.49)
PROTHROMBIN TIME: 14.1 s (ref 11.6–15.2)

## 2014-10-10 LAB — BASIC METABOLIC PANEL
Anion gap: 8 (ref 5–15)
BUN: 30 mg/dL — AB (ref 6–20)
CALCIUM: 7.7 mg/dL — AB (ref 8.9–10.3)
CHLORIDE: 100 mmol/L — AB (ref 101–111)
CO2: 26 mmol/L (ref 22–32)
Creatinine, Ser: 1.81 mg/dL — ABNORMAL HIGH (ref 0.61–1.24)
GFR calc Af Amer: 45 mL/min — ABNORMAL LOW (ref >60)
GFR calc non Af Amer: 39 mL/min — ABNORMAL LOW (ref >60)
GLUCOSE: 290 mg/dL — AB (ref 65–99)
Potassium: 5.6 mmol/L — ABNORMAL HIGH (ref 3.5–5.1)
Sodium: 134 mmol/L — ABNORMAL LOW (ref 135–145)

## 2014-10-10 LAB — MAGNESIUM: Magnesium: 1.8 mg/dL (ref 1.7–2.4)

## 2014-10-10 LAB — CBC
HEMATOCRIT: 41.2 % (ref 39.0–52.0)
Hemoglobin: 13.7 g/dL (ref 13.0–17.0)
MCH: 32.6 pg (ref 26.0–34.0)
MCHC: 33.3 g/dL (ref 30.0–36.0)
MCV: 98.1 fL (ref 78.0–100.0)
Platelets: 222 10*3/uL (ref 150–400)
RBC: 4.2 MIL/uL — AB (ref 4.22–5.81)
RDW: 13 % (ref 11.5–15.5)
WBC: 22 10*3/uL — AB (ref 4.0–10.5)

## 2014-10-10 LAB — GLUCOSE, CAPILLARY
Glucose-Capillary: 232 mg/dL — ABNORMAL HIGH (ref 65–99)
Glucose-Capillary: 202 mg/dL — ABNORMAL HIGH (ref 65–99)
Glucose-Capillary: 186 mg/dL — ABNORMAL HIGH (ref 65–99)

## 2014-10-10 LAB — CREATININE, URINE, RANDOM: Creatinine, Urine: 306.64 mg/dL

## 2014-10-10 LAB — PHOSPHORUS: Phosphorus: 4.1 mg/dL (ref 2.5–4.6)

## 2014-10-10 LAB — SODIUM, URINE, RANDOM: Sodium, Ur: <10 mmol/L

## 2014-10-10 MED ORDER — ALBUTEROL SULFATE (2.5 MG/3ML) 0.083% IN NEBU
2.5000 mg | INHALATION_SOLUTION | RESPIRATORY_TRACT | Status: DC | PRN
  Administered 2014-10-11 – 2014-10-17 (×5): 2.5 mg via RESPIRATORY_TRACT
  Filled 2014-10-10 (×6): qty 3

## 2014-10-10 MED ORDER — ALBUTEROL SULFATE (2.5 MG/3ML) 0.083% IN NEBU: 2.5000 mg | INHALATION_SOLUTION | Freq: Four times a day (QID) | RESPIRATORY_TRACT | Status: DC

## 2014-10-10 MED ORDER — INSULIN ASPART 100 UNIT/ML ~~LOC~~ SOLN
0.0000 [IU] | Freq: Three times a day (TID) | SUBCUTANEOUS | Status: DC
  Administered 2014-10-10: 5 [IU] via SUBCUTANEOUS
  Administered 2014-10-10: 3 [IU] via SUBCUTANEOUS
  Administered 2014-10-11: 5 [IU] via SUBCUTANEOUS
  Administered 2014-10-11 – 2014-10-12 (×4): 3 [IU] via SUBCUTANEOUS
  Administered 2014-10-12: 2 [IU] via SUBCUTANEOUS
  Administered 2014-10-13: 3 [IU] via SUBCUTANEOUS
  Administered 2014-10-14: 2 [IU] via SUBCUTANEOUS
  Administered 2014-10-14: 3 [IU] via SUBCUTANEOUS
  Administered 2014-10-15: 2 [IU] via SUBCUTANEOUS
  Administered 2014-10-15 – 2014-10-16 (×3): 3 [IU] via SUBCUTANEOUS
  Administered 2014-10-17 – 2014-10-18 (×3): 2 [IU] via SUBCUTANEOUS

## 2014-10-10 MED ORDER — IPRATROPIUM-ALBUTEROL 0.5-2.5 (3) MG/3ML IN SOLN
3.0000 mL | Freq: Four times a day (QID) | RESPIRATORY_TRACT | Status: DC
  Administered 2014-10-10 – 2014-10-14 (×17): 3 mL via RESPIRATORY_TRACT
  Filled 2014-10-10 (×17): qty 3

## 2014-10-10 MED ORDER — INSULIN GLARGINE 100 UNIT/ML ~~LOC~~ SOLN
5.0000 [IU] | Freq: Every day | SUBCUTANEOUS | Status: DC
  Administered 2014-10-10 – 2014-10-17 (×8): 5 [IU] via SUBCUTANEOUS
  Filled 2014-10-10 (×8): qty 0.05

## 2014-10-10 MED ORDER — IPRATROPIUM BROMIDE 0.02 % IN SOLN: 0.5000 mg | Freq: Four times a day (QID) | RESPIRATORY_TRACT | Status: DC

## 2014-10-10 NOTE — Consult Note (Signed)
Name: Dalton Morris MRN: [id number] DOB: 1955-10-28    ADMISSION DATE:  10/09/2014 CONSULTATION DATE:  10/09/2104  REFERRING MD :  Dr. Barry Dienes  CHIEF COMPLAINT:  Wheezing  BRIEF PATIENT DESCRIPTION:  59 yo male smoker with pancreatic mass s/p pancreatectomy and splenectomy.  Had wheezing and hypoxia post-op.  Has hx of COPD, nocturnal hypoxemia (2 liters at night), CAD, PAD.  SIGNIFICANT EVENTS  6/30 Laparoscopic to open distal pancreatectomy, splenectomy, umbilical hernia repair  STUDIES:   HISTORY OF PRESENT ILLNESS:  59 yo male smoker was found to have pancreatic mass.  He had resection on 6/30.  Post-op he was noted to have worsening hypoxia and wheezing.  He was smoking until he came to hospital.  He smoked 1 pack per day.  He has hx of COPD and uses albuterol at home.  He has chronic daily cough with clear sputum.  He also wears 2 liters oxygen at night >> not clear why he needs oxygen.  He reports hx of CAD and PAD.  He has wheezing and congestion in his throat.  He has globus sensation.  He is still bringing up clear sputum.  He denies chest pain.  His abdominal pain after surgery is tolerable.  He c/o Lt shoulder pain since surgery.  There is no hx of PNA or exposure to tuberculosis.  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  There is no hx of thrombo-embolic disease.  PAST MEDICAL HISTORY :  He  has a past medical history of Hypertension; Pain; Myocardial infarction; History of oxygen administration; Umbilical hernia; H/O dizziness; Abdominal pain; Shortness of breath dyspnea; COPD (chronic obstructive pulmonary disease); Chronic back pain; Abdominal bloating; Finger pain; History of skin cancer; GERD (gastroesophageal reflux disease); and Cancer.  PAST SURGICAL HISTORY: He  has past surgical history that includes ORIF fibula fracture (Left); Colonoscopy; Esophagogastroduodenoscopy endoscopy; Tonsillectomy; and EUS (N/A, 07/24/2014).    Prior to  Admission medications   Medication Sig Start Date End Date Taking? Authorizing Provider  albuterol (PROVENTIL) (2.5 MG/3ML) 0.083% nebulizer solution Take 2.5 mg by nebulization 2 (two) times daily.   Yes Historical Provider, MD  ibuprofen (ADVIL,MOTRIN) 200 MG tablet Take 400 mg by mouth every 6 (six) hours as needed for mild pain or moderate pain.   Yes Historical Provider, MD  metoprolol tartrate (LOPRESSOR) 25 MG tablet Take 25 mg by mouth 2 (two) times daily.   Yes Historical Provider, MD  OXYGEN Inhale 2 L/mL into the lungs at bedtime.   Yes Historical Provider, MD  ciprofloxacin (CIPRO) 500 MG tablet Take 1 tablet (500 mg total) by mouth 2 (two) times daily. Patient not taking: Reported on 09/29/2014 07/24/14   Milus Banister, MD  esomeprazole (NEXIUM) 40 MG capsule Take 40 mg by mouth daily at 12 noon.    Historical Provider, MD  metroNIDAZOLE (FLAGYL) 250 MG tablet Take 1 tablet (250 mg total) by mouth 3 (three) times daily. Patient not taking: Reported on 09/29/2014 07/24/14   Milus Banister, MD   Allergies  Allergen Reactions  . Codeine     Stomach ache  . Contrast Media [Iodinated Diagnostic Agents] Hives  . Hydrocodone Itching    FAMILY HISTORY:  His family history includes Diabetes Mellitus II in his mother; Hypertension in his father; Seizures in his mother; Thyroid disease in his mother.   SOCIAL HISTORY: He  reports that he has been smoking Cigarettes.  He has a 20 pack-year smoking history.  He does not have any smokeless tobacco history on file. He reports that he does not drink alcohol or use illicit drugs.   REVIEW OF SYSTEMS:   Negative except above.  SUBJECTIVE:   VITAL SIGNS: Temp:  [97.2 F (36.2 C)-98.5 F (36.9 C)] 97.5 F (36.4 C) (07/01 0400) Pulse Rate:  [83-115] 96 (07/01 0938) Resp:  [12-25] 18 (07/01 0938) BP: (86-154)/(35-96) 126/78 mmHg (07/01 0938) SpO2:  [84 %-98 %] 92 % (07/01 0938)  PHYSICAL EXAMINATION: General: Pleasant Neuro:  Alert,  normal strength, CN intact HEENT: NG tube in place, mild stridor Cardiovascular:  Regular, no murmur Lungs:  Transmitted wheezing from neck area Abdomen:  Wound dressings clean, non tender, mild distention Musculoskeletal:  No edema Skin:  No rashes   CMP Latest Ref Rng 10/10/2014 10/06/2014  Glucose 65 - 99 mg/dL 290(H) 117(H)  BUN 6 - 20 mg/dL 30(H) 15  Creatinine 0.61 - 1.24 mg/dL 1.81(H) 1.06  Sodium 135 - 145 mmol/L 134(L) 137  Potassium 3.5 - 5.1 mmol/L 5.6(H) 5.2(H)  Chloride 101 - 111 mmol/L 100(L) 101  CO2 22 - 32 mmol/L 26 27  Calcium 8.9 - 10.3 mg/dL 7.7(L) 9.3  Total Protein 6.5 - 8.1 g/dL - 8.1  Total Bilirubin 0.3 - 1.2 mg/dL - 1.3(H)  Alkaline Phos 38 - 126 U/L - 52  AST 15 - 41 U/L - 41  ALT 17 - 63 U/L - 56    CBC Latest Ref Rng 10/10/2014 10/09/2014 10/06/2014  WBC 4.0 - 10.5 K/uL 22.0(H) 17.3(H) 9.1  Hemoglobin 13.0 - 17.0 g/dL 13.7 15.6 18.6(H)  Hematocrit 39.0 - 52.0 % 41.2 46.2 52.8(H)  Platelets 150 - 400 K/uL 222 223 223    CBG (last 3)  No results for input(s): GLUCAP in the last 72 hours.   No results found.  DISCUSSION: 59 yo male smoker now with post-op wheezing/stridor, productive cough, and hypoxia.  He has reported hx of COPD and asthma, and GERD.  He wears oxygen at night >> ? If he has sleep apnea.  He likely has upper airway irritation from NG tube and ETT during surgery causing mild stridor.  He also likely has atelectasis, and continuation of his chronic productive cough.  He might also have some degree of hypoventilation from dilaudid PCA.  ASSESSMENT / PLAN:  Mild stridor. Plan: - d/c NG tube when able per surgery - continue protonix for GERD - BiPAP prn  Hypoxemia >> related to atelectasis, COPD, ?OSA, and ?hypoventilation from pain meds. Plan: - oxygen to keep SpO2 90 to 95% - BiPAP qhs and prn during the day - duoneb q6h, albuterol q2h prn - flutter valve, incentive spirometry - limit narcotics as able - f/u CXR - mobilize  when okay with surgery  AKI >> baseline creatinine 1.06 from 10/06/14. Hyperkalemia. 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 Plan: - post-op care, nutrition per [facility]  Hx of HTN, CAD. Plan: - IV lopressor   Chesley Mires, MD [facility] 10/10/2014, 10:48 AM Pager:  [phone] After 3pm call: [phone]

## 2014-10-10 NOTE — Progress Notes (Signed)
Utilization review completed.  

## 2014-10-10 NOTE — Progress Notes (Addendum)
1 Day Post-Op  Subjective: Patient had higher than expected blood loss due to large tumor with surrounding inflammation.  Stabilized overnight.  No acute events.  Having issues keeping sats up.    Objective: Vital signs in last 24 hours: Temp:  [97.2 F (36.2 C)-98.5 F (36.9 C)] 97.5 F (36.4 C) (07/01 0400) Pulse Rate:  [83-115] 86 (07/01 0700) Resp:  [12-25] 15 (07/01 0700) BP: (86-154)/(35-96) 111/83 mmHg (07/01 0700) SpO2:  [84 %-98 %] 93 % (07/01 0700) Last BM Date:  (prior to admission)  Intake/Output from previous day: 06/30 0701 - 07/01 0700 In: 6560 [P.O.:360; I.V.:5250; IV Piggyback:950] Out: 2410 [Urine:985; Emesis/NG output:600; Drains:155; Blood:670] Intake/Output this shift:    General appearance: alert, cooperative and mild distress Resp: wheezing Cardio: regular rate and rhythm GI: soft, approp tender at incision, distended  Lab Results:   Recent Labs  10/09/14 1148 10/10/14 0340  WBC 17.3* 22.0*  HGB 15.6 13.7  HCT 46.2 41.2  PLT 223 222   BMET  Recent Labs  10/10/14 0340  NA 134*  K 5.6*  CL 100*  CO2 26  GLUCOSE 290*  BUN 30*  CREATININE 1.81*  CALCIUM 7.7*   PT/INR  Recent Labs  10/10/14 0340  LABPROT 14.1  INR 1.07   ABG No results for input(s): PHART, HCO3 in the last 72 hours.  Invalid input(s): PCO2, PO2  Studies/Results: No results found.  Anti-infectives: Anti-infectives    Start     Dose/Rate Route Frequency Ordered Stop   10/09/14 1800  ceFAZolin (ANCEF) IVPB 1 g/50 mL premix     1 g 100 mL/hr over 30 Minutes Intravenous Every 6 hours 10/09/14 1445 10/10/14 0658   10/09/14 0525  ceFAZolin (ANCEF) IVPB 2 g/50 mL premix     2 g 100 mL/hr over 30 Minutes Intravenous On call to O.R. 10/09/14 0525 10/09/14 1156      Assessment/Plan: s/p Procedure(s): LAPAROSCOPIC CONVERTED TO OPEN PANCREATECTOMY, SPLENECTOMY, REPAIR UMBILICAL HERNIA (N/A)  UMBILICAL HERNIA REPAIR  (N/A) Continue foley due to urinary output  monitoring Keep NGT due to high bilious output.  Add RISS and lantus for hyperglycemia Hypoxia - add nebs q6 hours OOB/pulmonary toilet Acute kidney injury-increase fluid and check urine sodium and creatinine.    LOS: 1 day    [facility] 10/10/2014

## 2014-10-10 NOTE — Progress Notes (Signed)
Changed Dilaudid PCA syringe witnessed by Allison,RN. Wasted 2.66mls Dilaudid witnessed by Steward Drone.10/10/2014 5:58 PM

## 2014-10-10 NOTE — Progress Notes (Signed)
Flutter valve initial education. Pt understood and verbally read back the instructions of the proper use of the flutter valve. Encouraged pt to use FV, 10x per hour while awake as tolerated. Pt demonstrated proper use of the flutter valve and it was correct. Encourage pt to deep breath and cough.

## 2014-10-10 NOTE — Progress Notes (Signed)
Pt in not on BIPAP at this time. Nasal Cannula 5L/m O2 saturations 93%. No signs of respiratory distress.

## 2014-10-11 DIAGNOSIS — J9811 Atelectasis: Secondary | ICD-10-CM

## 2014-10-11 DIAGNOSIS — Z720 Tobacco use: Secondary | ICD-10-CM

## 2014-10-11 DIAGNOSIS — R0600 Dyspnea, unspecified: Secondary | ICD-10-CM

## 2014-10-11 LAB — BASIC METABOLIC PANEL
Anion gap: 11 (ref 5–15)
BUN: 24 mg/dL — ABNORMAL HIGH (ref 6–20)
CO2: 27 mmol/L (ref 22–32)
Calcium: 8.1 mg/dL — ABNORMAL LOW (ref 8.9–10.3)
Chloride: 96 mmol/L — ABNORMAL LOW (ref 101–111)
Creatinine, Ser: 1.24 mg/dL (ref 0.61–1.24)
GFR calc Af Amer: >60 mL/min (ref >60)
GFR calc non Af Amer: >60 mL/min (ref >60)
Glucose, Bld: 235 mg/dL — ABNORMAL HIGH (ref 65–99)
Potassium: 4.9 mmol/L (ref 3.5–5.1)
Sodium: 134 mmol/L — ABNORMAL LOW (ref 135–145)

## 2014-10-11 LAB — GLUCOSE, CAPILLARY
GLUCOSE-CAPILLARY: 189 mg/dL — AB (ref 65–99)
Glucose-Capillary: 206 mg/dL — ABNORMAL HIGH (ref 65–99)
Glucose-Capillary: 180 mg/dL — ABNORMAL HIGH (ref 65–99)

## 2014-10-11 LAB — CBC
HCT: 37.1 % — ABNORMAL LOW (ref 39.0–52.0)
Hemoglobin: 12.4 g/dL — ABNORMAL LOW (ref 13.0–17.0)
MCH: 32.5 pg (ref 26.0–34.0)
MCHC: 33.4 g/dL (ref 30.0–36.0)
MCV: 97.1 fL (ref 78.0–100.0)
PLATELETS: 209 10*3/uL (ref 150–400)
RBC: 3.82 MIL/uL — ABNORMAL LOW (ref 4.22–5.81)
RDW: 13.1 % (ref 11.5–15.5)
WBC: 25.8 10*3/uL — AB (ref 4.0–10.5)

## 2014-10-11 LAB — HEMOGLOBIN A1C
Hgb A1c MFr Bld: 7.1 % — ABNORMAL HIGH (ref 4.8–5.6)
Mean Plasma Glucose: 157 mg/dL

## 2014-10-11 MED ORDER — CETYLPYRIDINIUM CHLORIDE 0.05 % MT LIQD
7.0000 mL | Freq: Two times a day (BID) | OROMUCOSAL | Status: DC
  Administered 2014-10-11 – 2014-10-17 (×8): 7 mL via OROMUCOSAL

## 2014-10-11 MED ORDER — METHYLPREDNISOLONE SODIUM SUCC 40 MG IJ SOLR
40.0000 mg | Freq: Three times a day (TID) | INTRAMUSCULAR | Status: AC
  Administered 2014-10-11 – 2014-10-12 (×3): 40 mg via INTRAVENOUS
  Filled 2014-10-11 (×3): qty 1

## 2014-10-11 MED ORDER — LORAZEPAM 2 MG/ML IJ SOLN
0.5000 mg | INTRAMUSCULAR | Status: DC | PRN
  Administered 2014-10-12 – 2014-10-13 (×2): 1 mg via INTRAVENOUS
  Filled 2014-10-11 (×2): qty 1

## 2014-10-11 MED ORDER — CHLORHEXIDINE GLUCONATE 0.12 % MT SOLN
15.0000 mL | Freq: Two times a day (BID) | OROMUCOSAL | Status: DC
  Administered 2014-10-11 – 2014-10-18 (×11): 15 mL via OROMUCOSAL
  Filled 2014-10-11 (×17): qty 15

## 2014-10-11 MED ORDER — HEPARIN SODIUM (PORCINE) 5000 UNIT/ML IJ SOLN
5000.0000 [IU] | Freq: Three times a day (TID) | INTRAMUSCULAR | Status: DC
  Administered 2014-10-11 – 2014-10-18 (×22): 5000 [IU] via SUBCUTANEOUS
  Filled 2014-10-11 (×25): qty 1

## 2014-10-11 NOTE — Progress Notes (Signed)
Dr. Redmond Pulling made aware due to pt pulled out NGT.  Pt having some anxiety, questioning if he is going to live.  Leave NGT out for now per MD orders.  Pt reassured that he was receiving appropriate care and that he could talk with his MD about his concerns.  Pt seems to be better and resting quietly.

## 2014-10-11 NOTE — Progress Notes (Signed)
2 Days Post-Op  Subjective: Pt pulled ng overnight; 440 out of ng; pain ok. CCM started steroids for upper airway.   Objective: Vital signs in last 24 hours: Temp:  [97.9 F (36.6 C)-99.2 F (37.3 C)] 98 F (36.7 C) (07/02 0800) Pulse Rate:  [101-131] 121 (07/02 0800) Resp:  [19-30] 30 (07/02 0800) BP: (130-165)/(61-96) 135/72 mmHg (07/02 0800) SpO2:  [90 %-98 %] 92 % (07/02 0855) FiO2 (%):  [50 %] 50 % (07/02 0314) Weight:  [115.5 kg (254 lb 10.1 oz)] 115.5 kg (254 lb 10.1 oz) (07/02 0400) Last BM Date:  (PTA)  Intake/Output from previous day: 07/01 0701 - 07/02 0700 In: 3101.2 [P.O.:80; I.V.:2521.2; NG/GT:500] Out: 1735 [Urine:1220; Emesis/NG output:440; Drains:75] Intake/Output this shift: Total I/O In: 125 [I.V.:125] Out: 100 [Urine:100]  Alert, nontoxic Symmetric chest rise, non labored. Clear as best can tell. IS about 600 Soft, obese, protuberant, dressing intact along with pain catheters. JP - serosang No edema  Lab Results:   Recent Labs  10/10/14 0340 10/11/14 0343  WBC 22.0* 25.8*  HGB 13.7 12.4*  HCT 41.2 37.1*  PLT 222 209   BMET  Recent Labs  10/10/14 0340 10/11/14 0343  NA 134* 134*  K 5.6* 4.9  CL 100* 96*  CO2 26 27  GLUCOSE 290* 235*  BUN 30* 24*  CREATININE 1.81* 1.24  CALCIUM 7.7* 8.1*   PT/INR  Recent Labs  10/10/14 0340  LABPROT 14.1  INR 1.07   ABG No results for input(s): PHART, HCO3 in the last 72 hours.  Invalid input(s): PCO2, PO2  Studies/Results: Dg Chest Port 1 View  10/10/2014   CLINICAL DATA:  Wheezing  EXAM: PORTABLE CHEST - 1 VIEW  COMPARISON:  07/03/2014  FINDINGS: Mild patchy right lower lobe opacity, atelectasis versus pneumonia. Mild blunting of the left costophrenic angle. No pneumothorax.  The heart is normal in size.  Enteric tube courses into the stomach.  IMPRESSION: Mild patchy right lower lobe opacity, atelectasis versus pneumonia.  Possible small left pleural effusion.  Enteric tube courses into the  stomach.   Electronically Signed   By: Julian Hy M.D.   On: 10/10/2014 11:09    Anti-infectives: Anti-infectives    Start     Dose/Rate Route Frequency Ordered Stop   10/09/14 1800  ceFAZolin (ANCEF) IVPB 1 g/50 mL premix     1 g 100 mL/hr over 30 Minutes Intravenous Every 6 hours 10/09/14 1445 10/10/14 0658   10/09/14 0525  ceFAZolin (ANCEF) IVPB 2 g/50 mL premix     2 g 100 mL/hr over 30 Minutes Intravenous On call to O.R. 10/09/14 0525 10/09/14 1156      Assessment/Plan: s/p Procedure(s): LAPAROSCOPIC CONVERTED TO OPEN PANCREATECTOMY, SPLENECTOMY, REPAIR UMBILICAL HERNIA (N/A)  UMBILICAL HERNIA REPAIR  (N/A) D/c foley Ok to leave ng out. Can have ice chips Appreciate CCM assist on pulm matters OOB/pulmonary toilet Acute kidney injury-Cr better and almost back to baseline. Cont to foley Start chemical dvt prophylaxis - monitor for bleeding Pt/ot  Cont SDU/ICU stay  Leighton Ruff. Redmond Pulling, MD, FACS General, Bariatric, & Minimally Invasive Surgery [facility], [place]   LOS: 2 days    Gayland Curry 10/11/2014

## 2014-10-11 NOTE — Progress Notes (Signed)
Placed patient on BIPAP,via FFM auto settings (min 5.0, max 20.0) for QHS .  Patient also wearing Nasal cannula @ 6 lpm to monitor PCA pump use.

## 2014-10-11 NOTE — Consult Note (Addendum)
Name: Dalton Morris MRN: [id number] DOB: 06/06/1955    ADMISSION DATE:  10/09/2014 CONSULTATION DATE:  10/09/2104  REFERRING MD :  Dr. Barry Dienes  CHIEF COMPLAINT:  Wheezing  BRIEF PATIENT DESCRIPTION:  59 yo male smoker with pancreatic mass s/p pancreatectomy and splenectomy.  Had wheezing and hypoxia post-op.  Has hx of COPD, nocturnal hypoxemia (2 liters at night), CAD, PAD.  SIGNIFICANT EVENTS  6/30 Laparoscopic to open distal pancreatectomy, splenectomy, umbilical hernia repair 5/99 pulled out NG tube  STUDIES:   SUBJECTIVE:  C/o abdominal pain/bloating.  Hurts to take deep breath and when he coughs.  Had weird dreams last night and pulled out NG.  Feels wheezing in his throat.  VITAL SIGNS: Temp:  [97.9 F (36.6 C)-99.2 F (37.3 C)] 99.2 F (37.3 C) (07/02 0400) Pulse Rate:  [96-131] 121 (07/02 0800) Resp:  [18-30] 30 (07/02 0800) BP: (126-165)/(61-96) 135/72 mmHg (07/02 0800) SpO2:  [90 %-98 %] 91 % (07/02 0800) FiO2 (%):  [50 %] 50 % (07/02 0314) Weight:  [254 lb 10.1 oz (115.5 kg)] 254 lb 10.1 oz (115.5 kg) (07/02 0400)  PHYSICAL EXAMINATION: General: anxious Neuro: Normal strength HEENT: mild stridor Cardiovascular:  Regular, tachycardic, no murmur Lungs:  Transmitted wheezing from neck area Abdomen:  Wound dressings clean, non tender, mild distention Musculoskeletal:  No edema Skin:  No rashes   CMP Latest Ref Rng 10/11/2014 10/10/2014 10/06/2014  Glucose 65 - 99 mg/dL 235(H) 290(H) 117(H)  BUN 6 - 20 mg/dL 24(H) 30(H) 15  Creatinine 0.61 - 1.24 mg/dL 1.24 1.81(H) 1.06  Sodium 135 - 145 mmol/L 134(L) 134(L) 137  Potassium 3.5 - 5.1 mmol/L 4.9 5.6(H) 5.2(H)  Chloride 101 - 111 mmol/L 96(L) 100(L) 101  CO2 22 - 32 mmol/L 27 26 27   Calcium 8.9 - 10.3 mg/dL 8.1(L) 7.7(L) 9.3  Total Protein 6.5 - 8.1 g/dL - - 8.1  Total Bilirubin 0.3 - 1.2 mg/dL - - 1.3(H)  Alkaline Phos 38 - 126 U/L - - 52  AST 15 - 41 U/L - - 41  ALT 17 - 63 U/L - - 56    CBC Latest Ref  Rng 10/11/2014 10/10/2014 10/09/2014  WBC 4.0 - 10.5 K/uL 25.8(H) 22.0(H) 17.3(H)  Hemoglobin 13.0 - 17.0 g/dL 12.4(L) 13.7 15.6  Hematocrit 39.0 - 52.0 % 37.1(L) 41.2 46.2  Platelets 150 - 400 K/uL 209 222 223    CBG (last 3)   Recent Labs  10/10/14 1214 10/10/14 1650 10/10/14 2058  GLUCAP 232* 202* 186*     Dg Chest Port 1 View  10/10/2014   CLINICAL DATA:  Wheezing  EXAM: PORTABLE CHEST - 1 VIEW  COMPARISON:  07/03/2014  FINDINGS: Mild patchy right lower lobe opacity, atelectasis versus pneumonia. Mild blunting of the left costophrenic angle. No pneumothorax.  The heart is normal in size.  Enteric tube courses into the stomach.  IMPRESSION: Mild patchy right lower lobe opacity, atelectasis versus pneumonia.  Possible small left pleural effusion.  Enteric tube courses into the stomach.   Electronically Signed   By: Julian Hy M.D.   On: 10/10/2014 11:09   ASSESSMENT / PLAN:  Stridor >> likely from intubation, NG tube, and GERD. Plan: - continue protonix for GERD - BiPAP prn - will give solumedrol 40 mg q8h x 3 doses 7/02  Hypoxemia >> related to atelectasis, COPD, ?OSA, abdominal splinting and ?hypoventilation from pain meds. Plan: - oxygen to keep SpO2 90 to 95% - BiPAP qhs and prn during the day -  duoneb q6h, albuterol q2h prn - flutter valve, incentive spirometry - limit narcotics as able - f/u CXR intermittently - mobilize when okay with surgery  AKI from hypovolemia >> baseline creatinine 1.06 from 10/06/14 >> improved. Hyperkalemia >> improved. 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 Plan: - post-op care, nutrition per CCS - add lovenox/SQ heparin for DVT prophylaxis when okay with surgery  Hx of HTN, CAD. Plan: - IV lopressor  Leukocytosis. Plan: - f/u CBC  Anxiety. Plan: - prn ativan  Chesley Mires, MD [facility]  Pulmonary/Critical Care 10/11/2014, 8:13 AM Pager:  [phone] After 3pm call: [phone]

## 2014-10-12 ENCOUNTER — Inpatient Hospital Stay (HOSPITAL_COMMUNITY): Payer: Medicare Other

## 2014-10-12 DIAGNOSIS — J9620 Acute and chronic respiratory failure, unspecified whether with hypoxia or hypercapnia: Secondary | ICD-10-CM

## 2014-10-12 LAB — BASIC METABOLIC PANEL
Anion gap: 10 (ref 5–15)
BUN: 20 mg/dL (ref 6–20)
CALCIUM: 8.2 mg/dL — AB (ref 8.9–10.3)
CHLORIDE: 97 mmol/L — AB (ref 101–111)
CO2: 28 mmol/L (ref 22–32)
Creatinine, Ser: 0.93 mg/dL (ref 0.61–1.24)
GFR calc Af Amer: >60 mL/min (ref >60)
GFR calc non Af Amer: >60 mL/min (ref >60)
Glucose, Bld: 176 mg/dL — ABNORMAL HIGH (ref 65–99)
Potassium: 4.8 mmol/L (ref 3.5–5.1)
Sodium: 135 mmol/L (ref 135–145)

## 2014-10-12 LAB — CBC
HCT: 34.9 % — ABNORMAL LOW (ref 39.0–52.0)
HEMOGLOBIN: 11.4 g/dL — AB (ref 13.0–17.0)
MCH: 31.7 pg (ref 26.0–34.0)
MCHC: 32.7 g/dL (ref 30.0–36.0)
MCV: 96.9 fL (ref 78.0–100.0)
PLATELETS: 217 10*3/uL (ref 150–400)
RBC: 3.6 MIL/uL — ABNORMAL LOW (ref 4.22–5.81)
RDW: 13.1 % (ref 11.5–15.5)
WBC: 23.4 10*3/uL — AB (ref 4.0–10.5)

## 2014-10-12 LAB — GLUCOSE, CAPILLARY
GLUCOSE-CAPILLARY: 202 mg/dL — AB (ref 65–99)
GLUCOSE-CAPILLARY: 193 mg/dL — AB (ref 65–99)
GLUCOSE-CAPILLARY: 189 mg/dL — AB (ref 65–99)
GLUCOSE-CAPILLARY: 135 mg/dL — AB (ref 65–99)

## 2014-10-12 NOTE — Evaluation (Signed)
Physical Therapy Evaluation Patient Details Name: Dalton Morris MRN: [id number] DOB: [birth year] Today's Date: 10/12/2014   History of Present Illness  Pt admitted with pancreatic mass s/p open distal pancreatotomy and splenectomy and umbilical hernia repair 4/97/02.  Pt with hx of COPD on nighttime O2 @ 2l  Clinical Impression  Pt admitted as above and presenting with functional mobility limitations 2* generalized weakness, post op pain and deconditioning and ambulatory instability.  Pt will benefit from acute stay PT to maximize IND for return home and possible HHPT follow up dependent on acute stay progress.    Follow Up Recommendations Home health PT    Equipment Recommendations  Rolling walker with 5" wheels    Recommendations for Other Services OT consult     Precautions / Restrictions Precautions Precautions: Fall Precaution Comments: JP drain  L side Restrictions Weight Bearing Restrictions: No      Mobility  Bed Mobility Overal bed mobility: Needs Assistance Bed Mobility: Supine to Sit     Supine to sit: Min assist     General bed mobility comments: cues for log roll technique  Transfers Overall transfer level: Needs assistance Equipment used: Rolling walker (2 wheeled) Transfers: Sit to/from Stand Sit to Stand: Min assist         General transfer comment: cues for transition position and use of UEs to self assist  Ambulation/Gait Ambulation/Gait assistance: Min assist Ambulation Distance (Feet): 54 Feet Assistive device: Rolling walker (2 wheeled) Gait Pattern/deviations: Step-through pattern;Decreased step length - right;Decreased step length - left;Shuffle     General Gait Details: cues for posture, position from RW and pace.  One rest required 2* SOB with O2 @ 4L  Stairs            Wheelchair Mobility    Modified Rankin (Stroke Patients Only)       Balance                                             Pertinent  Vitals/Pain Pain Assessment: Faces Faces Pain Scale: Hurts little more Pain Location: abdominal discomfort with attempts to deep breathe or cough Pain Descriptors / Indicators: Sore Pain Intervention(s): Limited activity within patient's tolerance;Monitored during session    Home Living Family/patient expects to be discharged to:: Private residence Living Arrangements: Alone Available Help at Discharge: Family (dtr and son will assist 24/7 as needed) Type of Home: House Home Access: Stairs to enter Entrance Stairs-Rails: Psychiatric nurse of Steps: 2 Home Layout: Able to live on main level with bedroom/bathroom Home Equipment: Cane - single point      Prior Function Level of Independence: Independent               Hand Dominance        Extremity/Trunk Assessment   Upper Extremity Assessment: Generalized weakness           Lower Extremity Assessment: Generalized weakness         Communication   Communication: No difficulties  Cognition Arousal/Alertness: Awake/alert Behavior During Therapy: WFL for tasks assessed/performed Overall Cognitive Status: Within Functional Limits for tasks assessed                      General Comments      Exercises General Exercises - Lower Extremity Ankle Circles/Pumps: AROM;Both;15 reps;Supine      Assessment/Plan  PT Assessment Patient needs continued PT services  PT Diagnosis Difficulty walking   PT Problem List Decreased strength;Decreased range of motion;Decreased activity tolerance;Decreased mobility;Decreased knowledge of use of DME;Obesity;Pain  PT Treatment Interventions DME instruction;Gait training;Stair training;Functional mobility training;Therapeutic exercise;Therapeutic activities;Patient/family education   PT Goals (Current goals can be found in the Care Plan section) Acute Rehab PT Goals Patient Stated Goal: regain IND asap PT Goal Formulation: With patient Time For Goal  Achievement: 10/26/14 Potential to Achieve Goals: Good    Frequency Min 3X/week   Barriers to discharge        Co-evaluation               End of Session Equipment Utilized During Treatment: Oxygen Activity Tolerance: Patient tolerated treatment well;Patient limited by fatigue;Other (comment) (SOB) Patient left: in chair;with call bell/phone within reach;with family/visitor present Nurse Communication: Mobility status         Time: 1106-1130 PT Time Calculation (min) (ACUTE ONLY): 24 min   Charges:   PT Evaluation $Initial PT Evaluation Tier I: 1 Procedure PT Treatments $Gait Training: 8-22 mins   PT G Codes:        Ragen Laver 2014/10/26, 12:27 PM

## 2014-10-12 NOTE — Progress Notes (Signed)
Pt stated that he didn't think he could tolerate the BIPAP tonight.  Pt stated that the mask was just too much because he was claustrophobic.  Rt explained to Pt that we would only utilize tonight if we needed to.  Pt on 4 LPM [place] at this time and tolerating well, RT to monitor and assess as needed.

## 2014-10-12 NOTE — Progress Notes (Signed)
3 Days Post-Op  Subjective: Pain ok. No n/v. No flatus. Got out of bed. Been working on IS  Objective: Vital signs in last 24 hours: Temp:  [97.3 F (36.3 C)-98.3 F (36.8 C)] 97.3 F (36.3 C) (07/03 0400) Pulse Rate:  [80-117] 84 (07/03 0751) Resp:  [16-26] 21 (07/03 0756) BP: (102-163)/(66-85) 151/73 mmHg (07/03 0751) SpO2:  [89 %-98 %] 93 % (07/03 0843) Weight:  [114.1 kg (251 lb 8.7 oz)] 114.1 kg (251 lb 8.7 oz) (07/03 0400) Last BM Date:  (Unknown per patient.)  Intake/Output from previous day: 07/02 0701 - 07/03 0700 In: 1608.3 [P.O.:40; I.V.:1568.3] Out: 1005 [Urine:975; Drains:30] Intake/Output this shift:    Alert, nontoxic cta with some mild wheezing (IS 1200) Reg Protuberant, soft, incision c/d/i; JP-serosang; removed on-q cath No edema  Lab Results:   Recent Labs  10/11/14 0343 10/12/14 0340  WBC 25.8* 23.4*  HGB 12.4* 11.4*  HCT 37.1* 34.9*  PLT 209 217   BMET  Recent Labs  10/11/14 0343 10/12/14 0340  NA 134* 135  K 4.9 4.8  CL 96* 97*  CO2 27 28  GLUCOSE 235* 176*  BUN 24* 20  CREATININE 1.24 0.93  CALCIUM 8.1* 8.2*   PT/INR  Recent Labs  10/10/14 0340  LABPROT 14.1  INR 1.07   ABG No results for input(s): PHART, HCO3 in the last 72 hours.  Invalid input(s): PCO2, PO2  Studies/Results: Dg Chest Port 1 View  10/12/2014   CLINICAL DATA:  Atelectasis.  History of COPD, MI, hypertension.  EXAM: PORTABLE CHEST - 1 VIEW  COMPARISON:  10/10/2014  FINDINGS: A heart is normal size. Bibasilar atelectasis with small bilateral effusions. Diffuse interstitial prominence throughout the lungs. No acute bony abnormality.  IMPRESSION: Bibasilar opacities, right greater than left, likely atelectasis. Small bilateral effusions.  Mild interstitial prominence.  Cannot exclude interstitial edema.   Electronically Signed   By: Rolm Baptise M.D.   On: 10/12/2014 08:31   Dg Chest Port 1 View  10/10/2014   CLINICAL DATA:  Wheezing  EXAM: PORTABLE CHEST - 1  VIEW  COMPARISON:  07/03/2014  FINDINGS: Mild patchy right lower lobe opacity, atelectasis versus pneumonia. Mild blunting of the left costophrenic angle. No pneumothorax.  The heart is normal in size.  Enteric tube courses into the stomach.  IMPRESSION: Mild patchy right lower lobe opacity, atelectasis versus pneumonia.  Possible small left pleural effusion.  Enteric tube courses into the stomach.   Electronically Signed   By: Julian Hy M.D.   On: 10/10/2014 11:09    Anti-infectives: Anti-infectives    Start     Dose/Rate Route Frequency Ordered Stop   10/09/14 1800  ceFAZolin (ANCEF) IVPB 1 g/50 mL premix     1 g 100 mL/hr over 30 Minutes Intravenous Every 6 hours 10/09/14 1445 10/10/14 0658   10/09/14 0525  ceFAZolin (ANCEF) IVPB 2 g/50 mL premix     2 g 100 mL/hr over 30 Minutes Intravenous On call to O.R. 10/09/14 0525 10/09/14 1156      Assessment/Plan: s/p Procedure(s): LAPAROSCOPIC CONVERTED TO OPEN PANCREATECTOMY, SPLENECTOMY, REPAIR UMBILICAL HERNIA (N/A)  UMBILICAL HERNIA REPAIR  (N/A)  Aggressive pulm toilet, can do IS better today, OOB, appreciate CCM assist Cont chemical vte prophylaxis - had slight drop in hgb, repeat in AM, if cont to drop, may need to hold heparin Cr normal. uop ok Will let have clears - not sure if will tolerate. Told pt to take slow  Leighton Ruff. Redmond Pulling, MD,  FACS General, Bariatric, & Minimally Invasive Surgery [facility] [place]   LOS: 3 days    Greer Pickerel M 10/12/2014

## 2014-10-12 NOTE — Progress Notes (Signed)
Name: Dalton Morris MRN: [id number] DOB: 07/17/1955    ADMISSION DATE:  10/09/2014 CONSULTATION DATE:  10/09/2104  REFERRING MD :  Dr. Barry Dienes  CHIEF COMPLAINT:  Wheezing  BRIEF PATIENT DESCRIPTION:  59 yo male smoker with pancreatic mass s/p pancreatectomy and splenectomy.  Had wheezing and hypoxia post-op.  Has hx of COPD, nocturnal hypoxemia (2 liters at night), CAD, PAD.  SIGNIFICANT EVENTS  6/30 Laparoscopic to open distal pancreatectomy, splenectomy, umbilical hernia repair 0/24 pulled out NG tube 7/02 Add BiPAP qhs  STUDIES:   SUBJECTIVE:  Feels better.  Still has some stridor, but very mild now.  C/o chest congestion, but able to take deeper breaths.  VITAL SIGNS: Temp:  [97.3 F (36.3 C)-98.4 F (36.9 C)] 98.4 F (36.9 C) (07/03 0800) Pulse Rate:  [80-117] 84 (07/03 0751) Resp:  [16-26] 21 (07/03 0756) BP: (102-163)/(66-85) 151/73 mmHg (07/03 0751) SpO2:  [89 %-98 %] 93 % (07/03 0843) Weight:  [251 lb 8.7 oz (114.1 kg)] 251 lb 8.7 oz (114.1 kg) (07/03 0400)  PHYSICAL EXAMINATION: General: anxious Neuro: Normal strength HEENT: minimal stridor Cardiovascular:  Regular, no murmur Lungs: no wheeze Abdomen:  Wound dressings clean, non tender, mild distention Musculoskeletal:  No edema Skin:  No rashes   CMP Latest Ref Rng 10/12/2014 10/11/2014 10/10/2014  Glucose 65 - 99 mg/dL 176(H) 235(H) 290(H)  BUN 6 - 20 mg/dL 20 24(H) 30(H)  Creatinine 0.61 - 1.24 mg/dL 0.93 1.24 1.81(H)  Sodium 135 - 145 mmol/L 135 134(L) 134(L)  Potassium 3.5 - 5.1 mmol/L 4.8 4.9 5.6(H)  Chloride 101 - 111 mmol/L 97(L) 96(L) 100(L)  CO2 22 - 32 mmol/L 28 27 26   Calcium 8.9 - 10.3 mg/dL 8.2(L) 8.1(L) 7.7(L)  Total Protein 6.5 - 8.1 g/dL - - -  Total Bilirubin 0.3 - 1.2 mg/dL - - -  Alkaline Phos 38 - 126 U/L - - -  AST 15 - 41 U/L - - -  ALT 17 - 63 U/L - - -    CBC Latest Ref Rng 10/12/2014 10/11/2014 10/10/2014  WBC 4.0 - 10.5 K/uL 23.4(H) 25.8(H) 22.0(H)  Hemoglobin 13.0 - 17.0 g/dL  11.4(L) 12.4(L) 13.7  Hematocrit 39.0 - 52.0 % 34.9(L) 37.1(L) 41.2  Platelets 150 - 400 K/uL 217 209 222    CBG (last 3)   Recent Labs  10/11/14 0818 10/11/14 1211 10/11/14 1535  GLUCAP 206* 189* 180*     Dg Chest Port 1 View  10/12/2014   CLINICAL DATA:  Atelectasis.  History of COPD, MI, hypertension.  EXAM: PORTABLE CHEST - 1 VIEW  COMPARISON:  10/10/2014  FINDINGS: A heart is normal size. Bibasilar atelectasis with small bilateral effusions. Diffuse interstitial prominence throughout the lungs. No acute bony abnormality.  IMPRESSION: Bibasilar opacities, right greater than left, likely atelectasis. Small bilateral effusions.  Mild interstitial prominence.  Cannot exclude interstitial edema.   Electronically Signed   By: Rolm Baptise M.D.   On: 10/12/2014 08:31   Dg Chest Port 1 View  10/10/2014   CLINICAL DATA:  Wheezing  EXAM: PORTABLE CHEST - 1 VIEW  COMPARISON:  07/03/2014  FINDINGS: Mild patchy right lower lobe opacity, atelectasis versus pneumonia. Mild blunting of the left costophrenic angle. No pneumothorax.  The heart is normal in size.  Enteric tube courses into the stomach.  IMPRESSION: Mild patchy right lower lobe opacity, atelectasis versus pneumonia.  Possible small left pleural effusion.  Enteric tube courses into the stomach.   Electronically Signed   By: Bertis Ruddy  [place] Pink M.D.   On: 10/10/2014 11:09   ASSESSMENT / PLAN:  Stridor >> likely from intubation, NG tube, and GERD >> improving. Plan: - continue protonix for GERD - BiPAP prn - completed solumedrol courst 7/03  Hypoxemia >> related to atelectasis, COPD, ?OSA, abdominal splinting and ?hypoventilation from pain meds. Plan: - oxygen to keep SpO2 90 to 95% - BiPAP qhs and prn during the day - duoneb q6h, albuterol q2h prn - flutter valve, incentive spirometry - limit narcotics as able - f/u CXR intermittently - mobilize when okay with surgery  AKI from hypovolemia >> baseline creatinine 1.06 from 10/06/14  >> resolved. Hyperkalemia >> resolved. 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 Plan: - post-op care, nutrition per CCS - SQ heparin for DVT prophylaxis  Hx of HTN, CAD. Plan: - IV lopressor until able to take pills  Leukocytosis >> likely from acute stress after surgery and steroids. Plan: - f/u CBC  Anxiety. Plan: - prn ativan   Chesley Mires, MD [facility] Pulmonary/Critical Care 10/12/2014, 9:28 AM Pager:  [phone] After 3pm call: [phone]

## 2014-10-13 LAB — BASIC METABOLIC PANEL
ANION GAP: 7 (ref 5–15)
BUN: 22 mg/dL — ABNORMAL HIGH (ref 6–20)
CO2: 28 mmol/L (ref 22–32)
Calcium: 8 mg/dL — ABNORMAL LOW (ref 8.9–10.3)
Chloride: 99 mmol/L — ABNORMAL LOW (ref 101–111)
Creatinine, Ser: 0.94 mg/dL (ref 0.61–1.24)
GFR calc Af Amer: >60 mL/min (ref >60)
GFR calc non Af Amer: >60 mL/min (ref >60)
Glucose, Bld: 149 mg/dL — ABNORMAL HIGH (ref 65–99)
Potassium: 4.2 mmol/L (ref 3.5–5.1)
SODIUM: 134 mmol/L — AB (ref 135–145)

## 2014-10-13 LAB — TYPE AND SCREEN
ABO/RH(D): A POS
Antibody Screen: NEGATIVE
UNIT DIVISION: 0
Unit division: 0

## 2014-10-13 LAB — GLUCOSE, CAPILLARY
GLUCOSE-CAPILLARY: 154 mg/dL — AB (ref 65–99)
GLUCOSE-CAPILLARY: 126 mg/dL — AB (ref 65–99)
Glucose-Capillary: 146 mg/dL — ABNORMAL HIGH (ref 65–99)
Glucose-Capillary: 156 mg/dL — ABNORMAL HIGH (ref 65–99)
Glucose-Capillary: 96 mg/dL (ref 65–99)

## 2014-10-13 LAB — CBC
HCT: 35.8 % — ABNORMAL LOW (ref 39.0–52.0)
Hemoglobin: 11.6 g/dL — ABNORMAL LOW (ref 13.0–17.0)
MCH: 31.6 pg (ref 26.0–34.0)
MCHC: 32.4 g/dL (ref 30.0–36.0)
MCV: 97.5 fL (ref 78.0–100.0)
Platelets: 252 10*3/uL (ref 150–400)
RBC: 3.67 MIL/uL — ABNORMAL LOW (ref 4.22–5.81)
RDW: 13.1 % (ref 11.5–15.5)
WBC: 20 10*3/uL — ABNORMAL HIGH (ref 4.0–10.5)

## 2014-10-13 MED ORDER — PNEUMOCOCCAL VAC POLYVALENT 25 MCG/0.5ML IJ INJ
0.5000 mL | INJECTION | INTRAMUSCULAR | Status: DC
  Filled 2014-10-13 (×3): qty 0.5

## 2014-10-13 NOTE — Progress Notes (Signed)
4 Days Post-Op  Subjective: Yesterday was "rough" & "crazy"; when asked how was it crazy pt said he felt that the nurses were bringing him medications for difft problem at the wrong time. Reports more flatus than burping. Pain controlled.   Objective: Vital signs in last 24 hours: Temp:  [98.1 F (36.7 C)-98.4 F (36.9 C)] 98.2 F (36.8 C) (07/04 0819) Pulse Rate:  [77-103] 85 (07/04 0500) Resp:  [13-24] 13 (07/04 0500) BP: (103-151)/(65-99) 118/80 mmHg (07/04 0500) SpO2:  [81 %-100 %] 96 % (07/04 0819) FiO2 (%):  [50 %] 50 % (07/03 1600) Last BM Date:  (Unknown per patient.)  Intake/Output from previous day: 07/03 0701 - 07/04 0700 In: 1113.3 [I.V.:1113.3] Out: 675 [Urine:675] Intake/Output this shift: Total I/O In: -  Out: 400 [Urine:400]  Awake, ox3 cta ant Obese, protuberant, hypobs, drain - serosang No edema  Lab Results:   Recent Labs  10/12/14 0340 10/13/14 0353  WBC 23.4* 20.0*  HGB 11.4* 11.6*  HCT 34.9* 35.8*  PLT 217 252   BMET  Recent Labs  10/12/14 0340 10/13/14 0353  NA 135 134*  K 4.8 4.2  CL 97* 99*  CO2 28 28  GLUCOSE 176* 149*  BUN 20 22*  CREATININE 0.93 0.94  CALCIUM 8.2* 8.0*   PT/INR No results for input(s): LABPROT, INR in the last 72 hours. ABG No results for input(s): PHART, HCO3 in the last 72 hours.  Invalid input(s): PCO2, PO2  Studies/Results: Dg Chest Port 1 View  10/12/2014   CLINICAL DATA:  Atelectasis.  History of COPD, MI, hypertension.  EXAM: PORTABLE CHEST - 1 VIEW  COMPARISON:  10/10/2014  FINDINGS: A heart is normal size. Bibasilar atelectasis with small bilateral effusions. Diffuse interstitial prominence throughout the lungs. No acute bony abnormality.  IMPRESSION: Bibasilar opacities, right greater than left, likely atelectasis. Small bilateral effusions.  Mild interstitial prominence.  Cannot exclude interstitial edema.   Electronically Signed   By: Rolm Baptise M.D.   On: 10/12/2014 08:31     Anti-infectives: Anti-infectives    Start     Dose/Rate Route Frequency Ordered Stop   10/09/14 1800  ceFAZolin (ANCEF) IVPB 1 g/50 mL premix     1 g 100 mL/hr over 30 Minutes Intravenous Every 6 hours 10/09/14 1445 10/10/14 0658   10/09/14 0525  ceFAZolin (ANCEF) IVPB 2 g/50 mL premix     2 g 100 mL/hr over 30 Minutes Intravenous On call to O.R. 10/09/14 0525 10/09/14 1156      Assessment/Plan: s/p Procedure(s): LAPAROSCOPIC CONVERTED TO OPEN PANCREATECTOMY, SPLENECTOMY, REPAIR UMBILICAL HERNIA (N/A)  UMBILICAL HERNIA REPAIR  (N/A)  i think his resp status is stable at this point for tx to floor - discussed with Dr Halford Chessman who agrees. i think getting him out of the unit will be beneficial  Will cont clear liquids today.  If tolerates clears today, will adv tomorrow and d/c pca pulm toilet, is (only about 1100 this am), flutter Cont chemical vte prophylaxis - hgb stable Follow wbc - no fever.   Leighton Ruff. Redmond Pulling, MD, FACS General, Bariatric, & Minimally Invasive Surgery [facility], [place]   LOS: 4 days    Gayland Curry 10/13/2014

## 2014-10-13 NOTE — Progress Notes (Signed)
Pt ripped BIPAP mask off and broke the mask.  Pt stated that he couldn't wear it.  RT placed pt back on 4 LPM [place].  Pt tolerating well at this time, RT to monitor and assess as needed.

## 2014-10-13 NOTE — Progress Notes (Signed)
Date:  October 13, 2014 U.R. performed for needs and level of care. Will continue to follow for Case Management needs.  Matei Magnone, RN, BSN, CCM   [phone] 

## 2014-10-13 NOTE — Progress Notes (Signed)
Date:  October 13, 2014 U.R. performed for needs and level of care. Will continue to follow for Case Management needs.  Rhonda Davis, RN, BSN, CCM   [phone] 

## 2014-10-13 NOTE — Progress Notes (Signed)
Name: Dalton Morris MRN: [id number] DOB: [birth year]    ADMISSION DATE:  10/09/2014 CONSULTATION DATE:  10/09/2104  REFERRING MD :  Dr. Barry Dienes  CHIEF COMPLAINT:  Wheezing  BRIEF PATIENT DESCRIPTION:  59 yo male smoker with pancreatic mass s/p pancreatectomy and splenectomy.  Had wheezing and hypoxia post-op.  Has hx of COPD, nocturnal hypoxemia (2 liters at night), CAD, PAD.  SIGNIFICANT EVENTS  6/30 Laparoscopic to open distal pancreatectomy, splenectomy, umbilical hernia repair 7/59 pulled out NG tube 7/02 Add BiPAP qh 7/04 To floor, d/c BiPAP  STUDIES:   SUBJECTIVE:  Walked in hall yesterday.  Breathing better.  Did not tolerate BiPAP last night.  Not as much chest congestion.  No more wheezing.  VITAL SIGNS: Temp:  [98.1 F (36.7 C)-98.4 F (36.9 C)] 98.2 F (36.8 C) (07/04 0819) Pulse Rate:  [77-103] 85 (07/04 0500) Resp:  [13-24] 13 (07/04 0500) BP: (103-151)/(65-99) 118/80 mmHg (07/04 0500) SpO2:  [81 %-100 %] 96 % (07/04 0819) FiO2 (%):  [50 %] 50 % (07/03 1600)  PHYSICAL EXAMINATION: General: anxious Neuro: Normal strength HEENT: no stridor Cardiovascular:  Regular, no murmur Lungs: no wheeze Abdomen:  Wound dressings clean, non tender, mild distention Musculoskeletal:  No edema Skin:  No rashes   CMP Latest Ref Rng 10/13/2014 10/12/2014 10/11/2014  Glucose 65 - 99 mg/dL 149(H) 176(H) 235(H)  BUN 6 - 20 mg/dL 22(H) 20 24(H)  Creatinine 0.61 - 1.24 mg/dL 0.94 0.93 1.24  Sodium 135 - 145 mmol/L 134(L) 135 134(L)  Potassium 3.5 - 5.1 mmol/L 4.2 4.8 4.9  Chloride 101 - 111 mmol/L 99(L) 97(L) 96(L)  CO2 22 - 32 mmol/L 28 28 27   Calcium 8.9 - 10.3 mg/dL 8.0(L) 8.2(L) 8.1(L)  Total Protein 6.5 - 8.1 g/dL - - -  Total Bilirubin 0.3 - 1.2 mg/dL - - -  Alkaline Phos 38 - 126 U/L - - -  AST 15 - 41 U/L - - -  ALT 17 - 63 U/L - - -    CBC Latest Ref Rng 10/13/2014 10/12/2014 10/11/2014  WBC 4.0 - 10.5 K/uL 20.0(H) 23.4(H) 25.8(H)  Hemoglobin 13.0 - 17.0 g/dL 11.6(L)  11.4(L) 12.4(L)  Hematocrit 39.0 - 52.0 % 35.8(L) 34.9(L) 37.1(L)  Platelets 150 - 400 K/uL 252 217 209    CBG (last 3)   Recent Labs  10/12/14 1655 10/12/14 2350 10/13/14 0810  GLUCAP 135* 146* 154*     Dg Chest Port 1 View  10/12/2014   CLINICAL DATA:  Atelectasis.  History of COPD, MI, hypertension.  EXAM: PORTABLE CHEST - 1 VIEW  COMPARISON:  10/10/2014  FINDINGS: A heart is normal size. Bibasilar atelectasis with small bilateral effusions. Diffuse interstitial prominence throughout the lungs. No acute bony abnormality.  IMPRESSION: Bibasilar opacities, right greater than left, likely atelectasis. Small bilateral effusions.  Mild interstitial prominence.  Cannot exclude interstitial edema.   Electronically Signed   By: Rolm Baptise M.D.   On: 10/12/2014 08:31   ASSESSMENT / PLAN:  Stridor >> likely from intubation, NG tube, and GERD >> resolved 7/04. Plan: - continue protonix for GERD - d/c BiPAP 7/04 - completed solumedrol courst 7/03  Hypoxemia >> related to atelectasis, COPD, ?OSA, abdominal splinting and ?hypoventilation from pain meds. Plan: - oxygen to keep SpO2 90 to 95% - duoneb q6h, albuterol q2h prn - flutter valve, incentive spirometry - limit narcotics as able - f/u CXR intermittently - mobilize when okay with surgery  AKI from hypovolemia >> baseline creatinine 1.06  from 10/06/14 >> resolved. Hyperkalemia >> resolved. 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 Plan: - post-op care, nutrition per [facility] - SQ heparin for DVT prophylaxis  Hx of HTN, CAD. Plan: - IV lopressor until able to take pills  Leukocytosis >> likely from acute stress after surgery and steroids. Plan: - f/u CBC  Anxiety. Plan: - prn ativan  Okay to transfer out of ICU.  D/w Dr. Redmond Pulling.   Chesley Mires, MD [facility] Pulmonary/Critical  Care 10/13/2014, 8:52 AM Pager:  [phone] After 3pm call: [phone]

## 2014-10-13 NOTE — Progress Notes (Signed)
[place] W/CHAIR WITH O2 5L [place] WITH NT. REPORT GIVEN TO AMY RN. NO APPARENT CHANGE IN PATIENT'S CONDITION.

## 2014-10-13 NOTE — Care Management Note (Signed)
Case Management Note  Patient Details  Name: Dalton Morris MRN: [id number] Date of Birth: 01-26-1956  Subjective/Objective:                    Action/Plan: will follow   Expected Discharge Date:      [id number]            Expected Discharge Plan:  Home/Self Care  In-House Referral:  NA  Discharge planning Services  CM Consult  Post Acute Care Choice:  NA Choice offered to:  NA  DME Arranged:  N/A DME Agency:  NA  HH Arranged:  NA HH Agency:  NA  Status of Service:  In process, will continue to follow  Medicare Important Message Given:    Date Medicare IM Given:    Medicare IM give by:    Date Additional Medicare IM Given:    Additional Medicare Important Message give by:     If discussed at [place] of Stay Meetings, dates discussed:    Additional Comments:  Leeroy Cha, RN 10/13/2014, 10:05 AM

## 2014-10-13 NOTE — Progress Notes (Signed)
PT Cancellation Note  Patient Details Name: Dalton Morris MRN: [id number] DOB: 12/30/1955   Cancelled Treatment:    Pt worked with OT earlier then transferred from ICU to [place].  Now really tired.  Asked [place] to return tomorrow.   Rica Koyanagi  PTA [facility]  Acute  Rehab Pager      [phone]

## 2014-10-13 NOTE — Evaluation (Signed)
Occupational Therapy Evaluation Patient Details Name: Dalton Morris MRN: [id number] DOB: [birth year] Today's Date: 10/13/2014    History of Present Illness Pt admitted with pancreatic mass s/p open distal pancreatotomy and splenectomy and umbilical hernia repair 05/10/84.  Pt with hx of COPD on nighttime O2 @ 2l   Clinical Impression   This 59 year old man was admitted for the above.  At baseline, he is independent with all ADLs.  Pt currently needs min A for SPT and up to max A for LB adls due to pain/discomfort.  He can have 24/7 assistance at home.  Goals in acute are for supervision level    Follow Up Recommendations  Supervision/Assistance - 24 hour    Equipment Recommendations   (to be further assessed; has comfort height commode)    Recommendations for Other Services       Precautions / Restrictions Precautions Precautions: Fall Precaution Comments: JP drain  L side Restrictions Weight Bearing Restrictions: No      Mobility Bed Mobility         Supine to sit: Min assist     General bed mobility comments: cues for log roll technique  Transfers Overall transfer level: Needs assistance Equipment used: Rolling walker (2 wheeled) Transfers: Sit to/from [facility] Sit to Stand: Min assist Stand pivot transfers: Min assist       General transfer comment: min A to steady/for balance    Balance                                            ADL Overall ADL's : Needs assistance/impaired     Grooming: Set up;Sitting   Upper Body Bathing: Set up;Sitting   Lower Body Bathing: Moderate assistance;Sit to/from stand   Upper Body Dressing : Minimal assistance;Sitting (lines)   Lower Body Dressing: Maximal assistance;Sit to/from stand   Toilet Transfer: Minimal assistance;Stand-pivot (to w/c)   Toileting- Clothing Manipulation and Hygiene: Minimal assistance;Sit to/from stand         General ADL Comments: Pt was getting  ready to move up from ICU/SDU.  Assisted in transferring to w/c .  Pt did not use RW but would benefit due to unsteadiness.  He will have children 24/7 and friends will also assist.  Pt states he can get a reacher, if needed for adls.  Limited by discomfort at this time and also has JP drain     Vision     Perception     Praxis      Pertinent Vitals/Pain Pain Assessment: 0-10 Pain Score: 2  (2 most of the time; had sharp short lived pain once--PCA) Pain Location: abdomen Pain Descriptors / Indicators: Sore Pain Intervention(s): Limited activity within patient's tolerance;Monitored during session;Repositioned;PCA encouraged     Hand Dominance     Extremity/Trunk Assessment Upper Extremity Assessment Upper Extremity Assessment:  (WFLs, MMT deferred due to abd sx)           Communication Communication Communication: No difficulties   Cognition Arousal/Alertness: Awake/alert Behavior During Therapy: WFL for tasks assessed/performed Overall Cognitive Status: Within Functional Limits for tasks assessed                     General Comments       Exercises       Shoulder Instructions      Home Living Family/patient expects to be  discharged to:: Private residence Living Arrangements: Alone Available Help at Discharge: Family;Available 24 hours/day (daughter and son) Type of Home: House             Bathroom Shower/Tub: Walk-in Corporate treasurer Toilet: Handicapped height     Home Equipment: [place] in          Prior Functioning/Environment Level of Independence: Independent             OT Diagnosis: Generalized weakness;Acute pain   OT Problem List: Decreased strength;Decreased activity tolerance;Impaired balance (sitting and/or standing);Decreased knowledge of use of DME or AE;Pain   OT Treatment/Interventions: Self-care/ADL training;DME and/or AE instruction;Patient/family education;Balance training;Therapeutic activities     OT Goals(Current goals can be found in the care plan section) Acute Rehab OT Goals Patient Stated Goal: regain Independence asap OT Goal Formulation: With patient Time For Goal Achievement: 10/27/14 Potential to Achieve Goals: Good ADL Goals Pt Will Perform Grooming: with supervision;standing Pt Will Perform Lower Body Bathing: with supervision;with adaptive equipment;sit to/from stand Pt Will Perform Lower Body Dressing: with supervision;with adaptive equipment;sit to/from stand (pants with reacher) Pt Will Transfer to Toilet: with supervision;ambulating;bedside commode (vs comfort height) Pt Will Perform Toileting - Clothing Manipulation and hygiene: with supervision;sit to/from stand  OT Frequency: Min 2X/week   Barriers to D/C:            Co-evaluation              End of Session    Activity Tolerance: Patient tolerated treatment well Patient left: in chair;with call bell/phone within reach   Time: 1232-1249 OT Time Calculation (min): 17 min Charges:  OT General Charges $OT Visit: 1 Procedure OT Evaluation $Initial OT Evaluation Tier I: 1 Procedure G-Codes:    Dalton Morris 11/06/2014, 1:30 PM Dalton Morris, OTR/L [phone] 11-06-2014

## 2014-10-14 DIAGNOSIS — J410 Simple chronic bronchitis: Secondary | ICD-10-CM

## 2014-10-14 DIAGNOSIS — J9601 Acute respiratory failure with hypoxia: Secondary | ICD-10-CM

## 2014-10-14 LAB — BASIC METABOLIC PANEL
ANION GAP: 6 (ref 5–15)
BUN: 19 mg/dL (ref 6–20)
CALCIUM: 7.9 mg/dL — AB (ref 8.9–10.3)
CO2: 31 mmol/L (ref 22–32)
CREATININE: 0.95 mg/dL (ref 0.61–1.24)
Chloride: 99 mmol/L — ABNORMAL LOW (ref 101–111)
GFR calc non Af Amer: >60 mL/min (ref >60)
Glucose, Bld: 147 mg/dL — ABNORMAL HIGH (ref 65–99)
Potassium: 3.6 mmol/L (ref 3.5–5.1)
Sodium: 136 mmol/L (ref 135–145)

## 2014-10-14 LAB — CBC
HCT: 34.9 % — ABNORMAL LOW (ref 39.0–52.0)
Hemoglobin: 11.5 g/dL — ABNORMAL LOW (ref 13.0–17.0)
MCH: 32 pg (ref 26.0–34.0)
MCHC: 33 g/dL (ref 30.0–36.0)
MCV: 97.2 fL (ref 78.0–100.0)
PLATELETS: 318 10*3/uL (ref 150–400)
RBC: 3.59 MIL/uL — ABNORMAL LOW (ref 4.22–5.81)
RDW: 13.1 % (ref 11.5–15.5)
WBC: 15.8 10*3/uL — ABNORMAL HIGH (ref 4.0–10.5)

## 2014-10-14 LAB — GLUCOSE, CAPILLARY
Glucose-Capillary: 137 mg/dL — ABNORMAL HIGH (ref 65–99)
Glucose-Capillary: 163 mg/dL — ABNORMAL HIGH (ref 65–99)
Glucose-Capillary: 109 mg/dL — ABNORMAL HIGH (ref 65–99)
Glucose-Capillary: 124 mg/dL — ABNORMAL HIGH (ref 65–99)

## 2014-10-14 MED ORDER — HYDROMORPHONE HCL 1 MG/ML IJ SOLN: 0.5000 mg | INTRAMUSCULAR | Status: DC | PRN

## 2014-10-14 MED ORDER — BISACODYL 10 MG RE SUPP
10.0000 mg | Freq: Every day | RECTAL | Status: DC
  Administered 2014-10-14 – 2014-10-17 (×4): 10 mg via RECTAL
  Filled 2014-10-14 (×4): qty 1

## 2014-10-14 MED ORDER — ACETAMINOPHEN 325 MG PO TABS
650.0000 mg | ORAL_TABLET | Freq: Four times a day (QID) | ORAL | Status: DC
  Administered 2014-10-14 – 2014-10-18 (×17): 650 mg via ORAL
  Filled 2014-10-14 (×20): qty 2

## 2014-10-14 MED ORDER — POLYETHYLENE GLYCOL 3350 17 G PO PACK
17.0000 g | PACK | Freq: Every day | ORAL | Status: DC
  Administered 2014-10-14 – 2014-10-17 (×4): 17 g via ORAL
  Filled 2014-10-14 (×5): qty 1

## 2014-10-14 MED ORDER — TRAMADOL HCL 50 MG PO TABS: 50.0000 mg | ORAL_TABLET | Freq: Four times a day (QID) | ORAL | Status: DC | PRN

## 2014-10-14 MED ORDER — ARFORMOTEROL TARTRATE 15 MCG/2ML IN NEBU
15.0000 ug | INHALATION_SOLUTION | Freq: Two times a day (BID) | RESPIRATORY_TRACT | Status: DC
  Administered 2014-10-14 – 2014-10-16 (×5): 15 ug via RESPIRATORY_TRACT
  Filled 2014-10-14 (×9): qty 2

## 2014-10-14 NOTE — Progress Notes (Signed)
Occupational Therapy Treatment Patient Details Name: Dalton Morris MRN: [id number] DOB: April 14, 1955 Today's Date: 10/14/2014    History of present illness Pt admitted with pancreatic mass s/p open distal pancreatotomy and splenectomy and umbilical hernia repair 2/54/27.  Pt with hx of COPD on nighttime O2 @ 2l   OT comments  Pt is making progress with OT.  Need to continue to work on activity tolerance.  Follow Up Recommendations  Supervision/Assistance - 24 hour    Equipment Recommendations  None recommended by OT    Recommendations for Other Services      Precautions / Restrictions Precautions Precautions: Fall Precaution Comments: JP drain  L side. Monitor sats Restrictions Weight Bearing Restrictions: No       Mobility Bed Mobility         Supine to sit: Supervision;HOB elevated Sit to supine: Supervision;HOB elevated   General bed mobility comments: pt rolled and used bed rail  Transfers Overall transfer level: Needs assistance Equipment used: Rolling walker (2 wheeled) Transfers: Sit to/from Stand Sit to Stand: Min guard         For Sport and exercise psychologist                                   ADL                           Toilet Transfer: Min guard;Ambulation;Comfort height toilet;Grab bars             General ADL Comments: Pt ambulated to bathroom with min guard assist.  No LOB.  Educated on energy conservation and pacing himself as well as pursed lip breathing.  Sats dropped to 85% on 6 liters of 02.  Encouraged PLB to recover; pt gets nervous when he can't catch his breath.  He feels that son will install grab bars for him at toilet and in shower.  He has a high built in seat:  do not anticipate he will need other DME.  He is not really interested in AE--will have children assist wtih adls until he can comfortably reach himself      Vision                     Perception     Praxis      Cognition   Behavior  During Therapy: [facility] for tasks assessed/performed Overall Cognitive Status: Within Functional Limits for tasks assessed                       Extremity/Trunk Assessment               Exercises     Shoulder Instructions       General Comments      Pertinent Vitals/ Pain       Pain Assessment: 0-10 Pain Score: 5  Pain Location: side Pain Descriptors / Indicators: Cramping Pain Intervention(s): Limited activity within patient's tolerance;Monitored during session;Repositioned;PCA encouraged  Home Living                                          Prior Functioning/Environment              Frequency Min 2X/week     Progress Toward Goals  OT  Goals(current goals can now be found in the care plan section)  Progress towards OT goals: Progressing toward goals--discontinued LB goals  ADL Goals Pt Will Perform Lower Body Bathing:  (discontinue) Pt Will Perform Lower Body Dressing:  (discontinue)  Plan      Co-evaluation                 End of Session     Activity Tolerance Patient tolerated treatment well   Patient Left in bed;with call bell/phone within reach   Nurse Communication          Time: 1540-0867 OT Time Calculation (min): 33 min  Charges: OT General Charges $OT Visit: 1 Procedure OT Treatments $Self Care/Home Management : 8-22 mins $Therapeutic Activity: 8-22 mins  Akanksha [place] 10/14/2014, 3:28 PM   Lesle Chris, OTR/L [phone] 10/14/2014

## 2014-10-14 NOTE — Progress Notes (Signed)
   Name: Dalton Morris MRN: [id number] DOB: [birth year]    ADMISSION DATE:  10/09/2014 CONSULTATION DATE:  10/09/2104  REFERRING MD :  Dr. Barry Dienes  CHIEF COMPLAINT:  Wheezing  BRIEF PATIENT DESCRIPTION:  59 yo male smoker with pancreatic mass s/p pancreatectomy and splenectomy.  Had wheezing and hypoxia post-op.  Has hx of COPD, nocturnal hypoxemia (2 liters at night), CAD, PAD.  SIGNIFICANT EVENTS  6/30 Laparoscopic to open distal pancreatectomy, splenectomy, umbilical hernia repair 7/12 pulled out NG tube 7/02 Add BiPAP qh 7/04 To floor, d/c BiPAP 7/5 abd pain, distention making resp more difficult  STUDIES:   SUBJECTIVE:  Not breathing well. Abd hurts.  VITAL SIGNS: Temp:  [97.8 F (36.6 C)-99 F (37.2 C)] 99 F (37.2 C) (07/05 0519) Pulse Rate:  [83-95] 87 (07/05 0519) Resp:  [15-23] 15 (07/05 0747) BP: (104-142)/(45-89) 136/73 mmHg (07/05 0519) SpO2:  [90 %-96 %] 93 % (07/05 0813)  PHYSICAL EXAMINATION: General: no distress but still w/ dyspnea  Neuro: Normal strength HEENT: no stridor, but does have faint upper airway wheeze  Cardiovascular:  Regular, no murmur Lungs: exp wheeze  Abdomen:  Wound dressings clean, non tender, mild distention + tympanic percussion  Musculoskeletal:  No edema Skin:  No rashes   CMP Latest Ref Rng 10/14/2014 10/13/2014 10/12/2014  Glucose 65 - 99 mg/dL 147(H) 149(H) 176(H)  BUN 6 - 20 mg/dL 19 22(H) 20  Creatinine 0.61 - 1.24 mg/dL 0.95 0.94 0.93  Sodium 135 - 145 mmol/L 136 134(L) 135  Potassium 3.5 - 5.1 mmol/L 3.6 4.2 4.8  Chloride 101 - 111 mmol/L 99(L) 99(L) 97(L)  CO2 22 - 32 mmol/L 31 28 28   Calcium 8.9 - 10.3 mg/dL 7.9(L) 8.0(L) 8.2(L)  Total Protein 6.5 - 8.1 g/dL - - -  Total Bilirubin 0.3 - 1.2 mg/dL - - -  Alkaline Phos 38 - 126 U/L - - -  AST 15 - 41 U/L - - -  ALT 17 - 63 U/L - - -    CBC Latest Ref Rng 10/14/2014 10/13/2014 10/12/2014  WBC 4.0 - 10.5 K/uL 15.8(H) 20.0(H) 23.4(H)  Hemoglobin 13.0 - 17.0 g/dL 11.5(L)  11.6(L) 11.4(L)  Hematocrit 39.0 - 52.0 % 34.9(L) 35.8(L) 34.9(L)  Platelets 150 - 400 K/uL 318 252 217    CBG (last 3)   Recent Labs  10/13/14 1702 10/13/14 2122 10/14/14 0725  GLUCAP 96 126* 137*     No results found. ASSESSMENT / PLAN:  Multifactorial Hypoxemia >> related to atelectasis, COPD, ?OSA, abdominal splinting and ?hypoventilation from pain meds Post-op stridor.  likely from intubation, NG tube, and GERD >> resolved 7/04 - completed solumedrol courst 7/03  - does not have PFTs - still w/ some upper airway noises 7/5 but abd distention and gas pain/splinting are biggest respiratory issues.   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 Plan: - post-op care, nutrition per [facility] - SQ heparin for DVT prophylaxis

## 2014-10-14 NOTE — Care Management Note (Addendum)
Case Management Note  Patient Details  Name: Dalton Morris MRN: [id number] Date of Birth: [birth year]  Subjective/Objective:                  pancreatic mass.   Action/Plan: Discharge planning  Expected Discharge Date:                  Expected Discharge Plan:  [place]  In-House Referral:     Discharge planning Services  CM Consult  Post Acute Care Choice:  Home Health Choice offered to:  Patient  DME Arranged:    DME Agency:     HH Arranged:  PT [place]:  [place]  Status of Service:  In process, will continue to follow  Medicare Important Message Given:  Yes-second notification given Date Medicare IM Given:    Medicare IM give by:    Date Additional Medicare IM Given:    Additional Medicare Important Message give by:     If discussed at [place] of Stay Meetings, dates discussed:    Additional Comments: CM met with pt in room.  Pt's daughter will pick up pt upon discharge and be staying with him for the first week or so post hospitalization.  HHPT has been recc.  Pt chooses [facility] to render HHPT.  Orders and face to face have been requested.  Address and contact information verified by  Pt.  Referral called to [place].   Will continue to monitor for orders. Dellie Catholic, RN 10/14/2014, 4:10 PM

## 2014-10-14 NOTE — Progress Notes (Signed)
Patient ID: Dalton Morris, male   DOB: 1955/09/06, 59 y.o.   MRN: [id number] 5 Days Post-Op   Subjective: Patient transferred to floor yesterday.  Was tired.  Minimal flatus.  No BM.  Feels very bloated.    Objective: Vital signs in last 24 hours: Temp:  [97.8 F (36.6 C)-99 F (37.2 C)] 99 F (37.2 C) (07/05 0519) Pulse Rate:  [83-95] 87 (07/05 0519) Resp:  [15-23] 15 (07/05 0747) BP: (104-142)/(45-89) 136/73 mmHg (07/05 0519) SpO2:  [90 %-96 %] 93 % (07/05 0813) Last BM Date: 10/09/14  Intake/Output from previous day: 07/04 0701 - 07/05 0700 In: 1350 [P.O.:200; I.V.:1150] Out: 1410 [Urine:1300; Drains:110] Intake/Output this shift:    Awake, ox3 Breathing comfortably Abd s, nd, approp tender at incisions.  Drain serosang.   No edema  Lab Results:   Recent Labs  10/13/14 0353 10/14/14 0405  WBC 20.0* 15.8*  HGB 11.6* 11.5*  HCT 35.8* 34.9*  PLT 252 318   BMET  Recent Labs  10/13/14 0353 10/14/14 0405  NA 134* 136  K 4.2 3.6  CL 99* 99*  CO2 28 31  GLUCOSE 149* 147*  BUN 22* 19  CREATININE 0.94 0.95  CALCIUM 8.0* 7.9*   PT/INR No results for input(s): LABPROT, INR in the last 72 hours. ABG No results for input(s): PHART, HCO3 in the last 72 hours.  Invalid input(s): PCO2, PO2  Studies/Results: No results found.  Anti-infectives: Anti-infectives    Start     Dose/Rate Route Frequency Ordered Stop   10/09/14 1800  ceFAZolin (ANCEF) IVPB 1 g/50 mL premix     1 g 100 mL/hr over 30 Minutes Intravenous Every 6 hours 10/09/14 1445 10/10/14 0658   10/09/14 0525  ceFAZolin (ANCEF) IVPB 2 g/50 mL premix     2 g 100 mL/hr over 30 Minutes Intravenous On call to O.R. 10/09/14 0525 10/09/14 1156      Assessment/Plan: s/p Procedure(s): LAPAROSCOPIC CONVERTED TO OPEN PANCREATECTOMY, SPLENECTOMY, REPAIR UMBILICAL HERNIA (N/A)  UMBILICAL HERNIA REPAIR  (N/A)  Ambulate Hold on diet advance given bloating.  Add suppository and miralax.   Pathology  benign.   Pulmonary toilet.   Cont chemical vte prophylaxis - hgb stable Follow wbc - no fever.    LOS: 5 days    Dalton Morris 10/14/2014

## 2014-10-14 NOTE — Care Management (Signed)
Important Message  Patient Details  Name: Dalton Morris MRN: [id number] Date of Birth: November 02, 1955   Medicare Important Message Given:  Yes-second notification given    Camillo Flaming, LCSW 10/14/2014, 1:55 PM

## 2014-10-14 NOTE — Progress Notes (Signed)
Physical Therapy Treatment Patient Details Name: Dalton Morris MRN: [id number] DOB: 09-26-1955 Today's Date: 10/14/2014    History of Present Illness Pt admitted with pancreatic mass s/p open distal pancreatotomy and splenectomy and umbilical hernia repair 1/82/99.  Pt with hx of COPD on nighttime O2 @ 2l    PT Comments    Progressing slowly with mobility. Able to increase ambulation distance on today. Pt took two walks with a seated rest break in between. Remained on [place] 6L O2-sats dropped to 83% with activity. Pain rated 5/10. Encouraged pt to take a walk later today with nursing supervision if able.   Follow Up Recommendations  Home health PT     Equipment Recommendations  Rolling walker with 5" wheels    Recommendations for Other Services OT consult     Precautions / Restrictions Precautions Precautions: Fall Precaution Comments: JP drain  L side. Monitor sats Restrictions Weight Bearing Restrictions: No    Mobility  Bed Mobility               General bed mobility comments: pt oob in recliner  Transfers Overall transfer level: Needs assistance Equipment used: Rolling walker (2 wheeled) Transfers: Sit to/from Stand Sit to Stand: Min guard         General transfer comment: x2. close guard for safety. Increased time.   Ambulation/Gait Ambulation/Gait assistance: Min guard Ambulation Distance (Feet): 60 Feet (30'x1, 60'x1) Assistive device: Rolling walker (2 wheeled) Gait Pattern/deviations: Step-through pattern;Decreased stride length;Trunk flexed     General Gait Details: Adjusted height of walker-pt c/o pulling in abdomen when walking. VCs safety, posture. Close guard for safety. Remained on 6L O2-sats dropped to 83%-VCs pursed lip/deep breathing and for standing rest breaks.    Stairs            Wheelchair Mobility    Modified Rankin (Stroke Patients Only)       Balance                                    Cognition  Arousal/Alertness: Awake/alert Behavior During Therapy: WFL for tasks assessed/performed Overall Cognitive Status: Within Functional Limits for tasks assessed                      Exercises      General Comments        Pertinent Vitals/Pain Pain Assessment: 0-10 Pain Score: 5  Pain Location: abdomen Pain Descriptors / Indicators: Sore;Burning Pain Intervention(s): Monitored during session;PCA encouraged    Home Living                      Prior Function            PT Goals (current goals can now be found in the care plan section) Progress towards PT goals: Progressing toward goals (slowly)    Frequency  Min 3X/week    PT Plan Current plan remains appropriate    Co-evaluation             End of Session Equipment Utilized During Treatment: Oxygen Activity Tolerance: Patient limited by fatigue;Patient limited by pain (O2 sats drop with ambulation) Patient left: in chair;with call bell/phone within reach     Time: 3716-9678 PT Time Calculation (min) (ACUTE ONLY): 16 min  Charges:  $Gait Training: 8-22 mins  G Codes:      Weston Anna, MPT Pager: [phone]

## 2014-10-15 LAB — GLUCOSE, CAPILLARY
GLUCOSE-CAPILLARY: 132 mg/dL — AB (ref 65–99)
GLUCOSE-CAPILLARY: 165 mg/dL — AB (ref 65–99)
Glucose-Capillary: 115 mg/dL — ABNORMAL HIGH (ref 65–99)
Glucose-Capillary: 160 mg/dL — ABNORMAL HIGH (ref 65–99)

## 2014-10-15 MED ORDER — OXYCODONE-ACETAMINOPHEN 5-325 MG PO TABS
1.0000 | ORAL_TABLET | ORAL | Status: DC | PRN
  Administered 2014-10-17: 2 via ORAL
  Filled 2014-10-15: qty 2

## 2014-10-15 NOTE — Progress Notes (Signed)
   Name: Dalton Morris MRN: [id number] DOB: 10/31/1955    ADMISSION DATE:  10/09/2014 CONSULTATION DATE:  10/09/2104  REFERRING MD :  Dr. Barry Dienes  CHIEF COMPLAINT:  Wheezing  BRIEF PATIENT DESCRIPTION:  59 yo male smoker with pancreatic mass s/p pancreatectomy and splenectomy.  Had wheezing and hypoxia post-op.  Has hx of COPD, nocturnal hypoxemia (2 liters at night), CAD, PAD.  SIGNIFICANT EVENTS  6/30 Laparoscopic to open distal pancreatectomy, splenectomy, umbilical hernia repair 2/56 pulled out NG tube 7/02 Add BiPAP qh 7/04 To floor, d/c BiPAP 7/5 abd pain, distention making resp more difficult  STUDIES:   SUBJECTIVE:  Rough night abd more bloated.  VITAL SIGNS: Temp:  [97.7 F (36.5 C)-98.3 F (36.8 C)] 97.7 F (36.5 C) (07/06 0451) Pulse Rate:  [73-80] 80 (07/06 0451) Resp:  [14-24] 14 (07/06 0736) BP: (113-134)/(66-77) 134/68 mmHg (07/06 0451) SpO2:  [92 %-99 %] 92 % (07/06 0853)  PHYSICAL EXAMINATION: General: no distress but still w/ dyspnea  Neuro: Normal strength HEENT: no stridor Cardiovascular:  Regular, no murmur Lungs: clear Abdomen:  Wound dressings clean, non tender, mild distention + tympanic percussion (more distended than yesterday) Musculoskeletal:  No edema Skin:  No rashes   CMP Latest Ref Rng 10/14/2014 10/13/2014 10/12/2014  Glucose 65 - 99 mg/dL 147(H) 149(H) 176(H)  BUN 6 - 20 mg/dL 19 22(H) 20  Creatinine 0.61 - 1.24 mg/dL 0.95 0.94 0.93  Sodium 135 - 145 mmol/L 136 134(L) 135  Potassium 3.5 - 5.1 mmol/L 3.6 4.2 4.8  Chloride 101 - 111 mmol/L 99(L) 99(L) 97(L)  CO2 22 - 32 mmol/L 31 28 28   Calcium 8.9 - 10.3 mg/dL 7.9(L) 8.0(L) 8.2(L)  Total Protein 6.5 - 8.1 g/dL - - -  Total Bilirubin 0.3 - 1.2 mg/dL - - -  Alkaline Phos 38 - 126 U/L - - -  AST 15 - 41 U/L - - -  ALT 17 - 63 U/L - - -    CBC Latest Ref Rng 10/14/2014 10/13/2014 10/12/2014  WBC 4.0 - 10.5 K/uL 15.8(H) 20.0(H) 23.4(H)  Hemoglobin 13.0 - 17.0 g/dL 11.5(L) 11.6(L) 11.4(L)    Hematocrit 39.0 - 52.0 % 34.9(L) 35.8(L) 34.9(L)  Platelets 150 - 400 K/uL 318 252 217    CBG (last 3)   Recent Labs  10/14/14 1702 10/14/14 2116 10/15/14 0734  GLUCAP 109* 124* 132*     No results found. ASSESSMENT / PLAN:  Multifactorial Hypoxemia >> related to atelectasis, COPD, ?OSA, abdominal splinting and ?hypoventilation from pain meds Post-op stridor.  likely from intubation, NG tube, and GERD >> resolved 7/04 - completed solumedrol course - does not have PFTs - abd distention and ileus are his biggest pulmonary barriers as of 7/6.   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 Plan: - post-op care, nutrition per [facility] - SQ heparin for DVT prophylaxis  We will see again Friday. Will need to determine if he needs Oxygen prior to d/c and best home regimen from a pulmonary stand-point.   Erick Colace ACNP-BC [place] Pager # [phone] OR # [phone] if no answer  - somewhat improved, less pain & abd distension Can mobilise, consider cd dilaudid PCA.  Rigoberto Noel MD

## 2014-10-15 NOTE — Progress Notes (Signed)
Patient continues to refuse CPAP 

## 2014-10-15 NOTE — Progress Notes (Signed)
PT Cancellation Note  Patient Details Name: CRIT OBREMSKI MRN: [id number] DOB: [birth year]   Cancelled Treatment:    Reason Eval/Treat Not Completed: Fatigue/lethargy limiting ability to participate (states "I just don't feel well", attempted x 2 )   Claretha Cooper 10/15/2014, 3:57 PM Tresa Endo PT [phone]

## 2014-10-15 NOTE — Progress Notes (Signed)
Patient ID: Dalton Morris, male   DOB: 1955-11-14, 59 y.o.   MRN: [id number] 6 Days Post-Op   Subjective: Continues to feel bloated.  Passing minimal flatus.    Objective: Vital signs in last 24 hours: Temp:  [97.7 F (36.5 C)-98.3 F (36.8 C)] 98.3 F (36.8 C) (07/06 1405) Pulse Rate:  [73-98] 80 (07/06 1405) Resp:  [14-21] 14 (07/06 1405) BP: (114-134)/(66-80) 128/75 mmHg (07/06 1405) SpO2:  [90 %-98 %] 96 % (07/06 1405) Last BM Date: 10/09/14  Intake/Output from previous day: 07/05 0701 - 07/06 0700 In: 1440 [P.O.:240; I.V.:1200] Out: 1325 [Urine:1250; Drains:75] Intake/Output this shift: Total I/O In: 645 [P.O.:240; I.V.:405] Out: 215 [Urine:200; Drains:15]  Awake, ox3 Breathing comfortably Abd s, nd, approp tender at incisions.  Drain serosang.   No edema  Lab Results:   Recent Labs  10/13/14 0353 10/14/14 0405  WBC 20.0* 15.8*  HGB 11.6* 11.5*  HCT 35.8* 34.9*  PLT 252 318   BMET  Recent Labs  10/13/14 0353 10/14/14 0405  NA 134* 136  K 4.2 3.6  CL 99* 99*  CO2 28 31  GLUCOSE 149* 147*  BUN 22* 19  CREATININE 0.94 0.95  CALCIUM 8.0* 7.9*   PT/INR No results for input(s): LABPROT, INR in the last 72 hours. ABG No results for input(s): PHART, HCO3 in the last 72 hours.  Invalid input(s): PCO2, PO2  Studies/Results: No results found.  Anti-infectives: Anti-infectives    Start     Dose/Rate Route Frequency Ordered Stop   10/09/14 1800  ceFAZolin (ANCEF) IVPB 1 g/50 mL premix     1 g 100 mL/hr over 30 Minutes Intravenous Every 6 hours 10/09/14 1445 10/10/14 0658   10/09/14 0525  ceFAZolin (ANCEF) IVPB 2 g/50 mL premix     2 g 100 mL/hr over 30 Minutes Intravenous On call to O.R. 10/09/14 0525 10/09/14 1156      Assessment/Plan: s/p Procedure(s): LAPAROSCOPIC CONVERTED TO OPEN PANCREATECTOMY, SPLENECTOMY, REPAIR UMBILICAL HERNIA (N/A)  UMBILICAL HERNIA REPAIR  (N/A)  Ambulate Bowel regimen  Advance diet once good BMs.   D/C PCA.    Pulmonary toilet.   Cont chemical vte prophylaxis - hgb stable Follow wbc - no fever.    LOS: 6 days    Tran Randle 10/15/2014

## 2014-10-15 NOTE — Progress Notes (Signed)
OT Cancellation Note  Patient Details Name: Dalton Morris MRN: [id number] DOB: January 11, 1956   Cancelled Treatment:    Reason Eval/Treat Not Completed: Other (comment).  Spoke to [employer].  Pt was up a lot today and fatiqued/sleeping.  Will check back tomorrow, if schedule permits.    Alegra Rost 10/15/2014, 3:23 PM  Lesle Chris, OTR/L [phone] 10/15/2014

## 2014-10-16 ENCOUNTER — Telehealth: Payer: Self-pay | Admitting: Pulmonary Disease

## 2014-10-16 LAB — CBC
HEMATOCRIT: 34.3 % — AB (ref 39.0–52.0)
Hemoglobin: 11.5 g/dL — ABNORMAL LOW (ref 13.0–17.0)
MCH: 32.2 pg (ref 26.0–34.0)
MCHC: 33.5 g/dL (ref 30.0–36.0)
MCV: 96.1 fL (ref 78.0–100.0)
Platelets: 402 10*3/uL — ABNORMAL HIGH (ref 150–400)
RBC: 3.57 MIL/uL — AB (ref 4.22–5.81)
RDW: 13.3 % (ref 11.5–15.5)
WBC: 12.8 10*3/uL — AB (ref 4.0–10.5)

## 2014-10-16 LAB — BASIC METABOLIC PANEL
Anion gap: 5 (ref 5–15)
BUN: 9 mg/dL (ref 6–20)
CALCIUM: 8.1 mg/dL — AB (ref 8.9–10.3)
CO2: 33 mmol/L — ABNORMAL HIGH (ref 22–32)
Chloride: 99 mmol/L — ABNORMAL LOW (ref 101–111)
Creatinine, Ser: 0.92 mg/dL (ref 0.61–1.24)
GFR calc non Af Amer: >60 mL/min (ref >60)
GLUCOSE: 159 mg/dL — AB (ref 65–99)
Potassium: 3.9 mmol/L (ref 3.5–5.1)
SODIUM: 137 mmol/L (ref 135–145)

## 2014-10-16 LAB — GLUCOSE, CAPILLARY
GLUCOSE-CAPILLARY: 159 mg/dL — AB (ref 65–99)
Glucose-Capillary: 158 mg/dL — ABNORMAL HIGH (ref 65–99)
Glucose-Capillary: 101 mg/dL — ABNORMAL HIGH (ref 65–99)
Glucose-Capillary: 110 mg/dL — ABNORMAL HIGH (ref 65–99)

## 2014-10-16 MED ORDER — SIMETHICONE 80 MG PO CHEW
80.0000 mg | CHEWABLE_TABLET | Freq: Three times a day (TID) | ORAL | Status: DC
  Administered 2014-10-16 – 2014-10-18 (×7): 80 mg via ORAL
  Filled 2014-10-16 (×9): qty 1

## 2014-10-16 MED ORDER — METOPROLOL TARTRATE 1 MG/ML IV SOLN
5.0000 mg | Freq: Four times a day (QID) | INTRAVENOUS | Status: DC | PRN
Start: 2014-10-16 — End: 2014-10-18
  Filled 2014-10-16: qty 5

## 2014-10-16 MED ORDER — METOPROLOL TARTRATE 25 MG PO TABS
25.0000 mg | ORAL_TABLET | Freq: Two times a day (BID) | ORAL | Status: DC
  Administered 2014-10-16 – 2014-10-18 (×5): 25 mg via ORAL
  Filled 2014-10-16 (×6): qty 1

## 2014-10-16 MED ORDER — PANTOPRAZOLE SODIUM 40 MG PO TBEC
40.0000 mg | DELAYED_RELEASE_TABLET | Freq: Every day | ORAL | Status: DC
  Administered 2014-10-16 – 2014-10-17 (×2): 40 mg via ORAL
  Filled 2014-10-16 (×3): qty 1

## 2014-10-16 NOTE — Progress Notes (Signed)
This RT was advised of rx for nocturnal bipap in report.  Previous RT note made prior to seeing nocturnal bipap rx was discontinued.  RN aware.

## 2014-10-16 NOTE — Progress Notes (Signed)
Pt continues to refuse nocturnal bipap, stating that he is claustrophobic and can't stand it.  Machine not found in room.  Pt was advised that RT is available all night should he change his mind.  RN notified.

## 2014-10-16 NOTE — Progress Notes (Signed)
Patient ID: Dalton Morris, male   DOB: 1956/03/23, 59 y.o.   MRN: [id number] 7 Days Post-Op   Subjective: Patient feeling better after multiple BMs yesterday.  Drain output coming down.  Feels like he's "been through the ringer."  Not hungry still.    Objective: Vital signs in last 24 hours: Temp:  [97.9 F (36.6 C)-99 F (37.2 C)] 98.7 F (37.1 C) (07/07 0600) Pulse Rate:  [80-98] 81 (07/07 0600) Resp:  [14-18] 18 (07/07 0600) BP: (114-143)/(66-80) 124/68 mmHg (07/07 0600) SpO2:  [90 %-97 %] 94 % (07/07 0600) Last BM Date: 10/09/14  Intake/Output from previous day: 07/06 0701 - 07/07 0700 In: 2360 [P.O.:1560; I.V.:800] Out: 2850 [Urine:2800; Drains:50] Intake/Output this shift: Total I/O In: 1400 [P.O.:1200; I.V.:200] Out: 2130 [Urine:2100; Drains:30]  Awake, ox3 Breathing comfortably Abd s, nd, approp tender at incisions.  Drain serosang.  Less bloated than yesterday, but still quite distended.   tr edema  Lab Results:   Recent Labs  10/14/14 0405  WBC 15.8*  HGB 11.5*  HCT 34.9*  PLT 318   BMET  Recent Labs  10/14/14 0405  NA 136  K 3.6  CL 99*  CO2 31  GLUCOSE 147*  BUN 19  CREATININE 0.95  CALCIUM 7.9*   PT/INR No results for input(s): LABPROT, INR in the last 72 hours. ABG No results for input(s): PHART, HCO3 in the last 72 hours.  Invalid input(s): PCO2, PO2  Studies/Results: No results found.  Anti-infectives: Anti-infectives    Start     Dose/Rate Route Frequency Ordered Stop   10/09/14 1800  ceFAZolin (ANCEF) IVPB 1 g/50 mL premix     1 g 100 mL/hr over 30 Minutes Intravenous Every 6 hours 10/09/14 1445 10/10/14 0658   10/09/14 0525  ceFAZolin (ANCEF) IVPB 2 g/50 mL premix     2 g 100 mL/hr over 30 Minutes Intravenous On call to O.R. 10/09/14 0525 10/09/14 1156      Assessment/Plan: s/p Procedure(s): LAPAROSCOPIC CONVERTED TO OPEN PANCREATECTOMY, SPLENECTOMY, REPAIR UMBILICAL HERNIA (N/A)  UMBILICAL HERNIA REPAIR   (N/A)  Ambulate Bowel regimen  Advance diet as tolerated.   Change to PO meds for BP and reflux Continue RISS as diet still poor.   Pulmonary toilet.   Cont chemical vte prophylaxis - hgb stable Recheck WBCs.  Was trending down, but will recheck.     LOS: 7 days    [facility] 10/16/2014

## 2014-10-16 NOTE — Progress Notes (Signed)
Occupational Therapy Treatment Patient Details Name: Dalton Morris MRN: [id number] DOB: 07/17/1955 Today's Date: 10/16/2014    History of present illness Pt admitted with pancreatic mass s/p open distal pancreatotomy and splenectomy and umbilical hernia repair 05/27/22.  Pt with hx of COPD on nighttime O2 @ 2l   OT comments  Pt thankful for help of his children  Follow Up Recommendations  Home health OT;Supervision/Assistance - 24 hour          Precautions / Restrictions Precautions Precautions: Fall Precaution Comments: JP drain  L side. Monitor sats Restrictions Weight Bearing Restrictions: No       Mobility Bed Mobility               General bed mobility comments: pt in chair  Transfers Overall transfer level: Needs assistance   Transfers: Sit to/from Stand Sit to Stand: Min guard Stand pivot transfers: Min guard       General transfer comment: VC for hand placement        ADL Overall ADL's : Needs assistance/impaired                                       General ADL Comments: Pt with questions regarding oxygen at home. Discussed managing oxygen cord and fall prevention. Pt verbalized understanding. Pt overall fatigued this OT visit as he just just had bath with CNA.      Vision                            Cognition   Behavior During Therapy: WFL for tasks assessed/performed Overall Cognitive Status: Within Functional Limits for tasks assessed                       Extremity/Trunk Assessment               Exercises     Shoulder Instructions       General Comments      Pertinent Vitals/ Pain       Pain Score: 4  Pain Location: L side around stapes Pain Descriptors / Indicators: Other (Comment) (pulling) Pain Intervention(s): Monitored during session  Home Living                                          Prior Functioning/Environment              Frequency Min 2X/week      Progress Toward Goals  OT Goals(current goals can now be found in the care plan section)  Progress towards OT goals: Progressing toward goals  Acute Rehab OT Goals Patient Stated Goal: regain Independence asap  Plan Discharge plan remains appropriate    Co-evaluation                 End of Session     Activity Tolerance Patient tolerated treatment well   Patient Left in chair;with call bell/phone within reach   Nurse Communication Mobility status        Time: 1152-1205 OT Time Calculation (min): 13 min  Charges: OT General Charges $OT Visit: 1 Procedure OT Treatments $Self Care/Home Management : 8-22 mins  Ortha Metts, Edwena Felty D 10/16/2014, 12:17 PM

## 2014-10-16 NOTE — Telephone Encounter (Signed)
Per pete. Call patient to make an appointment in September for a [place]. Pt needs to see dr. Elsworth Soho. This is not a urgent appointment.

## 2014-10-17 DIAGNOSIS — R739 Hyperglycemia, unspecified: Secondary | ICD-10-CM

## 2014-10-17 LAB — CBC
HCT: 33.2 % — ABNORMAL LOW (ref 39.0–52.0)
HEMOGLOBIN: 10.9 g/dL — AB (ref 13.0–17.0)
MCH: 31.2 pg (ref 26.0–34.0)
MCHC: 32.8 g/dL (ref 30.0–36.0)
MCV: 95.1 fL (ref 78.0–100.0)
Platelets: 414 10*3/uL — ABNORMAL HIGH (ref 150–400)
RBC: 3.49 MIL/uL — ABNORMAL LOW (ref 4.22–5.81)
RDW: 13.5 % (ref 11.5–15.5)
WBC: 12.7 10*3/uL — ABNORMAL HIGH (ref 4.0–10.5)

## 2014-10-17 LAB — GLUCOSE, CAPILLARY
GLUCOSE-CAPILLARY: 136 mg/dL — AB (ref 65–99)
Glucose-Capillary: 140 mg/dL — ABNORMAL HIGH (ref 65–99)
Glucose-Capillary: 110 mg/dL — ABNORMAL HIGH (ref 65–99)
Glucose-Capillary: 126 mg/dL — ABNORMAL HIGH (ref 65–99)

## 2014-10-17 LAB — BASIC METABOLIC PANEL
Anion gap: 6 (ref 5–15)
BUN: 9 mg/dL (ref 6–20)
CALCIUM: 8.2 mg/dL — AB (ref 8.9–10.3)
CO2: 30 mmol/L (ref 22–32)
CREATININE: 0.79 mg/dL (ref 0.61–1.24)
Chloride: 102 mmol/L (ref 101–111)
GFR calc Af Amer: >60 mL/min (ref >60)
Glucose, Bld: 124 mg/dL — ABNORMAL HIGH (ref 65–99)
Potassium: 3.1 mmol/L — ABNORMAL LOW (ref 3.5–5.1)
Sodium: 138 mmol/L (ref 135–145)

## 2014-10-17 MED ORDER — LIVING WELL WITH DIABETES BOOK
Freq: Once | Status: AC
  Administered 2014-10-17: 13:00:00
  Filled 2014-10-17: qty 1

## 2014-10-17 MED ORDER — POTASSIUM CHLORIDE CRYS ER 20 MEQ PO TBCR
40.0000 meq | EXTENDED_RELEASE_TABLET | Freq: Every day | ORAL | Status: DC
  Administered 2014-10-17 – 2014-10-18 (×2): 40 meq via ORAL
  Filled 2014-10-17 (×3): qty 2

## 2014-10-17 NOTE — Progress Notes (Signed)
Physical Therapy Treatment Patient Details Name: Dalton Morris MRN: [id number] DOB: 06/10/1955 Today's Date: 10/17/2014    History of Present Illness Pt admitted with pancreatic mass s/p open distal pancreatotomy and splenectomy and umbilical hernia repair 0/62/37.  Pt with hx of COPD on nighttime O2 @ 2l    PT Comments    Improved mobility and activity tolerance compared to last session. Pt is still a little unsteady at times but no overt LOB noted on today. O2 sats 86-90% on RA during ambulation, dyspnea 2/4. Nursing may need to assess ambulatory O2 saturation again prior to d/c.   Follow Up Recommendations  No PT follow up;Supervision - Intermittent (pt with improved mobility on today. Discussed with pt-he declines HHPT. )     Equipment Recommendations  None recommended by PT    Recommendations for Other Services OT consult     Precautions / Restrictions Precautions Precautions: Fall Precaution Comments: JP drain  L side. Monitor sats Restrictions Weight Bearing Restrictions: No    Mobility  Bed Mobility Overal bed mobility: Needs Assistance Bed Mobility: Supine to Sit     Supine to sit: HOB elevated;Modified independent (Device/Increase time)        Transfers Overall transfer level: Needs assistance   Transfers: Sit to/from Stand Sit to Stand: Modified independent (Device/Increase time)            Ambulation/Gait Ambulation/Gait assistance: Supervision Ambulation Distance (Feet): 175 Feet Assistive device:  (held onto handrail intermittently) Gait Pattern/deviations: Step-through pattern     General Gait Details: supervision for safety. 1 standing rest break taken/needed. O2 sats 86-90% on RA while ambulating. Dyspnea 2/4   Stairs            Wheelchair Mobility    Modified Rankin (Stroke Patients Only)       Balance                                    Cognition Arousal/Alertness: Awake/alert Behavior During Therapy:  WFL for tasks assessed/performed Overall Cognitive Status: Within Functional Limits for tasks assessed                      Exercises      General Comments        Pertinent Vitals/Pain Pain Assessment: 0-10 Pain Score: 2  Pain Location: abdomen Pain Descriptors / Indicators: Sore Pain Intervention(s): Monitored during session    Home Living                      Prior Function            PT Goals (current goals can now be found in the care plan section) Progress towards PT goals: Progressing toward goals    Frequency  Min 3X/week    PT Plan Current plan remains appropriate    Co-evaluation             End of Session   Activity Tolerance: Patient limited by fatigue Patient left: in chair;with call bell/phone within reach     Time: 1340-1400 PT Time Calculation (min) (ACUTE ONLY): 20 min  Charges:  $Gait Training: 8-22 mins                    G Codes:      Weston Anna, MPT Pager: [phone]

## 2014-10-17 NOTE — Progress Notes (Signed)
   Name: Dalton Morris MRN: [id number] DOB: February 14, 1956    ADMISSION DATE:  10/09/2014 CONSULTATION DATE:  10/09/2104  REFERRING MD :  Dr. Barry Dienes  CHIEF COMPLAINT:  Wheezing  BRIEF PATIENT DESCRIPTION:  59 yo male smoker with pancreatic mass s/p pancreatectomy and splenectomy.  Had wheezing and hypoxia post-op.  Has hx of COPD, nocturnal hypoxemia (2 liters at night), CAD, PAD.  SIGNIFICANT EVENTS  6/30 Laparoscopic to open distal pancreatectomy, splenectomy, umbilical hernia repair 0/86 pulled out NG tube 7/02 Add BiPAP qh 7/04 To floor, d/c BiPAP 7/5 abd pain, distention making resp more difficult  STUDIES:   SUBJECTIVE:  Better dyspnea Liquid stool No abd pain  VITAL SIGNS: Temp:  [98.5 F (36.9 C)-99 F (37.2 C)] 98.6 F (37 C) (07/08 0600) Pulse Rate:  [74-78] 74 (07/08 0600) Resp:  [18] 18 (07/08 0600) BP: (130-134)/(58-77) 134/58 mmHg (07/08 0600) SpO2:  [94 %-99 %] 96 % (07/08 0934)  PHYSICAL EXAMINATION: General: no distress , able to liw supine Neuro: Normal strength HEENT: no stridor Cardiovascular:  Regular, no murmur Lungs: clear, decreased BL Abdomen:  Wound dressings clean, non tender, mild distention + tympanic percussion (more distended than yesterday) Musculoskeletal:  No edema Skin:  No rashes   CMP Latest Ref Rng 10/17/2014 10/16/2014 10/14/2014  Glucose 65 - 99 mg/dL 124(H) 159(H) 147(H)  BUN 6 - 20 mg/dL 9 9 19   Creatinine 0.61 - 1.24 mg/dL 0.79 0.92 0.95  Sodium 135 - 145 mmol/L 138 137 136  Potassium 3.5 - 5.1 mmol/L 3.1(L) 3.9 3.6  Chloride 101 - 111 mmol/L 102 99(L) 99(L)  CO2 22 - 32 mmol/L 30 33(H) 31  Calcium 8.9 - 10.3 mg/dL 8.2(L) 8.1(L) 7.9(L)  Total Protein 6.5 - 8.1 g/dL - - -  Total Bilirubin 0.3 - 1.2 mg/dL - - -  Alkaline Phos 38 - 126 U/L - - -  AST 15 - 41 U/L - - -  ALT 17 - 63 U/L - - -    CBC Latest Ref Rng 10/17/2014 10/16/2014 10/14/2014  WBC 4.0 - 10.5 K/uL 12.7(H) 12.8(H) 15.8(H)  Hemoglobin 13.0 - 17.0 g/dL 10.9(L)  11.5(L) 11.5(L)  Hematocrit 39.0 - 52.0 % 33.2(L) 34.3(L) 34.9(L)  Platelets 150 - 400 K/uL 414(H) 402(H) 318    CBG (last 3)   Recent Labs  10/16/14 1656 10/16/14 2158 10/17/14 0814  GLUCAP 101* 110* 136*     No results found. ASSESSMENT / PLAN:  Multifactorial Hypoxemia >> related to atelectasis, COPD, ?OSA, abdominal splinting and ?hypoventilation from pain meds Post-op stridor.  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 Hyperglycemia Plan: May need insulin , can get endocrine FU after discharge   Out pt FU arranged. Ambulate to decide if he needs Oxygen at home OK to dc on albuterol nebs & we can change regimen on outpt FU  Rigoberto Noel. MD

## 2014-10-17 NOTE — Progress Notes (Addendum)
Inpatient Diabetes Program Recommendations  AACE/ADA: New Consensus Statement on Inpatient Glycemic Control (2013)  Target Ranges:  Prepandial:   less than 140 mg/dL      Peak postprandial:   less than 180 mg/dL (1-2 hours)      Critically ill patients:  140 - 180 mg/dL    Results for Dalton Morris, Dalton Morris (MRN [id number]) as of 10/17/2014 11:40  Ref. Range 10/16/2014 07:58 10/16/2014 11:54 10/16/2014 16:56 10/16/2014 21:58  Glucose-Capillary Latest Ref Range: 65-99 mg/dL 159 (H) 158 (H) 101 (H) 110 (H)     Admit with: Distal Pancreatectomy/ Splenectomy  Current Insulin Orders: Lantus 5 units QHS         Novolog Moderate SSI (0-15 units) TID AC     -Patient has required very little insulin during hospital stay.  Likely does not need insulin at time of discharge.  -Spoke with patient this morning about his blood sugar control in the hospital.  Explained to patient how the pancreas produces insulin and what function insulin provides.  Explained that since we removed a portion of his pancreas that we have also removed some of the beta cells that produce insulin, however, patient has required very little insulin in the hospital.  Discussed the two insulins we are using for patient here in hospital (Lantus and Novolog) and how they work.  -Encouraged patient to get follow up with his PCP after d/c to monitor for the possibility of the onset of DM.  Patient stated he needs to find a PCP after d/c.  Informed patient about [facility] in [place].  Also discussed the symptoms of hyperglycemia (3 T syndrome- Tired, Thirsty, Tinkling [frequent urination]).  Explained to patient that he should seek care immediately if he has these symptoms after d/c.    -Have asked RN and NT to show pt how to check his own fingerstick glucose levels.  Patient should likely monitor glucose levels at home at least once daily to keep an eye on his glucose control.    -Have also ordered Living Well with DM educational  booklet for patient to have for future reference in case he develops DM in the near future due to his surgery.    MD- Please consider giving patient a Rx for a home blood glucose meter at time of d/c  Use Order # [id number] and have patient check at least once per day (different time everyday and record)   Will follow Wyn Quaker RN, MSN, CDE Diabetes Coordinator Inpatient Glycemic Control Team Team Pager: [phone] (8a-5p)

## 2014-10-17 NOTE — Progress Notes (Addendum)
Patient ID: TARICK PARENTEAU, male   DOB: Oct 08, 1955, 59 y.o.   MRN: [id number] 8 Days Post-Op   Subjective: Feeling much better.  Passed a lot of gas, had more bowel movements, and slept well.  No n/v.    Objective: Vital signs in last 24 hours: Temp:  [98.5 F (36.9 C)-99 F (37.2 C)] 98.6 F (37 C) (07/08 0600) Pulse Rate:  [74-81] 74 (07/08 0600) Resp:  [18] 18 (07/08 0600) BP: (130-134)/(58-77) 134/58 mmHg (07/08 0600) SpO2:  [93 %-99 %] 94 % (07/08 0600) Last BM Date: 10/16/14  Intake/Output from previous day: 07/07 0701 - 07/08 0700 In: 3040 [P.O.:1440; I.V.:1600] Out: 2055 [Urine:2000; Drains:55] Intake/Output this shift: Total I/O In: 2000 [P.O.:1200; I.V.:800] Out: 1740 [Urine:1700; Drains:40]  Awake, ox3 Breathing comfortably Abd s, nd, approp tender at incisions.  Drain serosang. Non distended. tr edema  Lab Results:   Recent Labs  10/16/14 0745 10/17/14 0518  WBC 12.8* 12.7*  HGB 11.5* 10.9*  HCT 34.3* 33.2*  PLT 402* 414*   BMET  Recent Labs  10/16/14 0745 10/17/14 0518  NA 137 138  K 3.9 3.1*  CL 99* 102  CO2 33* 30  GLUCOSE 159* 124*  BUN 9 9  CREATININE 0.92 0.79  CALCIUM 8.1* 8.2*   PT/INR No results for input(s): LABPROT, INR in the last 72 hours. ABG No results for input(s): PHART, HCO3 in the last 72 hours.  Invalid input(s): PCO2, PO2  Studies/Results: No results found.  Anti-infectives: Anti-infectives    Start     Dose/Rate Route Frequency Ordered Stop   10/09/14 1800  ceFAZolin (ANCEF) IVPB 1 g/50 mL premix     1 g 100 mL/hr over 30 Minutes Intravenous Every 6 hours 10/09/14 1445 10/10/14 0658   10/09/14 0525  ceFAZolin (ANCEF) IVPB 2 g/50 mL premix     2 g 100 mL/hr over 30 Minutes Intravenous On call to O.R. 10/09/14 0525 10/09/14 1156      Assessment/Plan: s/p Procedure(s): LAPAROSCOPIC CONVERTED TO OPEN PANCREATECTOMY, SPLENECTOMY, REPAIR UMBILICAL HERNIA (N/A)  UMBILICAL HERNIA REPAIR   (N/A)  Ambulate Bowel regimen  Carb mod diet.   Discuss insulin with medicine.   Saline lock IVF Hope for home tomorrow  Pulmonary toilet.   Cont chemical vte prophylaxis - hgb stable   LOS: 8 days    Heddy Vidana 10/17/2014

## 2014-10-18 LAB — GLUCOSE, CAPILLARY: Glucose-Capillary: 131 mg/dL — ABNORMAL HIGH (ref 65–99)

## 2014-10-18 MED ORDER — OXYCODONE-ACETAMINOPHEN 5-325 MG PO TABS: 1.0000 | ORAL_TABLET | ORAL | Status: DC | PRN

## 2014-10-18 NOTE — Care Management Note (Signed)
Case Management Note  Patient Details  Name: Dalton Morris MRN: [id number] Date of Birth: Jan 16, 1956  Subjective/Objective:                    Action/Plan:   Expected Discharge Date:   10/18/2014               Expected Discharge Plan:  Home/Self Care  In-House Referral:     Discharge planning Services  CM Consult  Post Acute Care Choice:    Choice offered to:  Patient   Status of Service:  Completed, signed off  Medicare Important Message Given:  Yes-second notification given Date Medicare IM Given:    Medicare IM give by:    Date Additional Medicare IM Given:    Additional Medicare Important Message give by:     If discussed at [place] of Stay Meetings, dates discussed:    Additional Comments: Notified [facility] of pt's refusal of [facility] at this time. Pt states he will have a caregiver available.  Erenest Rasher, RN 10/18/2014, 11:52 AM

## 2014-10-18 NOTE — Discharge Instructions (Signed)

## 2014-10-18 NOTE — Progress Notes (Signed)
Assessment unchanged. Pt and daughter verbalized understanding of dc instructions through teach back including follow up care. Info about advance home health care seeing pt for physical therapy post discharge on AVS. Pt refused home health PT stating "I don't need home health. I have help." Daughter reinforced that she would be helping pt at home. Case Manager made aware of pt's decision. Script x 1 given as provided by MD. Discharged via wc to front entrance to meet awaiting vehicle to carry home accompanied by NT and daughter.

## 2014-10-18 NOTE — Progress Notes (Signed)
Patient ID: MASSON NALEPA, male   DOB: 1955/04/22, 59 y.o.   MRN: [id number] 9 Days Post-Op   Subjective: Feeling much better.  Passed a lot of gas, had more bowel movements, and slept well.  No n/v.    Objective: Vital signs in last 24 hours: Temp:  [98.7 F (37.1 C)-98.8 F (37.1 C)] 98.7 F (37.1 C) (07/09 0630) Pulse Rate:  [77] 77 (07/09 0630) Resp:  [18] 18 (07/09 0630) BP: (113-142)/(65-74) 142/74 mmHg (07/09 0630) SpO2:  [94 %-95 %] 95 % (07/09 0749) Last BM Date: 10/17/14  Intake/Output from previous day: 07/08 0701 - 07/09 0700 In: 600 [P.O.:600] Out: -  Intake/Output this shift:    Awake, ox3 Breathing comfortably Abd s, nd, approp tender at incisions.  Non distended. tr edema  Lab Results:   Recent Labs  10/16/14 0745 10/17/14 0518  WBC 12.8* 12.7*  HGB 11.5* 10.9*  HCT 34.3* 33.2*  PLT 402* 414*   BMET  Recent Labs  10/16/14 0745 10/17/14 0518  NA 137 138  K 3.9 3.1*  CL 99* 102  CO2 33* 30  GLUCOSE 159* 124*  BUN 9 9  CREATININE 0.92 0.79  CALCIUM 8.1* 8.2*   PT/INR No results for input(s): LABPROT, INR in the last 72 hours. ABG No results for input(s): PHART, HCO3 in the last 72 hours.  Invalid input(s): PCO2, PO2  Studies/Results: No results found.  Anti-infectives: Anti-infectives    Start     Dose/Rate Route Frequency Ordered Stop   10/09/14 1800  ceFAZolin (ANCEF) IVPB 1 g/50 mL premix     1 g 100 mL/hr over 30 Minutes Intravenous Every 6 hours 10/09/14 1445 10/10/14 0658   10/09/14 0525  ceFAZolin (ANCEF) IVPB 2 g/50 mL premix     2 g 100 mL/hr over 30 Minutes Intravenous On call to O.R. 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  No home insulin per Dr Barry Dienes     LOS: 9 days    [place], Elmo Putt C. 04/19/1658

## 2014-10-20 NOTE — Telephone Encounter (Signed)
lmtcb x1 

## 2014-10-21 NOTE — Telephone Encounter (Signed)
Called and spoke to pt. Pt already has appt with TP on 8.2.16. Pt verbalized understanding and denied any further questions or concerns at this time.

## 2014-10-23 NOTE — Discharge Summary (Signed)
Physician Discharge Summary  Patient ID: Dalton Morris MRN: [id number] DOB/AGE: 1955/11/07 59 y.o.  Admit date: 10/09/2014 Discharge date: 10/23/2014  Admission Diagnoses: Patient Active Problem List   Diagnosis Date Noted  . Pancreatic mass 10/09/2014  HTN H/o MI COPD GERD Umbilical hernia  Discharge Diagnoses:  Benign pancreatic mass - lymphoepithelial cyst COPD Deconditioning ABL anemia  Discharged Condition: stable  Hospital Course:  Patient was admitted to the stepdown unit following a distal pancreatectomy/splenectomy and umbilical hernia repair. His tumor was quite central and was surrounded by inflammation.   He had some bleeding and oxygenation issues.  He had a pulmonary consult who helped manage his COPD.  He did require a course of steroids for his breathing.  His WBCs went up, likely secondary to this.  He did develop an ileus that did not resolve until POD 5-6.  He then was able to advance on his diet.  He also had difficulty ambulating and required PT assistance.  His oxygen sats were always around 90%.  He required some insulin at first, also correlating with cortisol surge.  Drain was removed prior to d/c.  He was able to ambulate, void independently, and maintain good pain control with oral narcotics.    Consults: pulmonary/intensive care  Significant Diagnostic Studies: labs: see epic.  Mild hypokalemia.  Mild hyperglycemia.    Treatments: surgery: see above.    Discharge Exam: Blood pressure 142/74, pulse 77, temperature 98.7 F (37.1 C), temperature source Oral, resp. rate 18, height 6\' 2"  (1.88 m), weight 114.1 kg (251 lb 8.7 oz), SpO2 95 %. General appearance: alert, cooperative and no distress Resp: breathing much bettter and comfortably prior to discharge Cardio: regular rate and rhythm GI: soft, protuberant.  tender at incisions.    Disposition: 01-Home or Self Care     Medication List    TAKE these medications        albuterol (2.5 MG/3ML)  0.083% nebulizer solution  Commonly known as:  PROVENTIL  Take 2.5 mg by nebulization 2 (two) times daily.     ciprofloxacin 500 MG tablet  Commonly known as:  CIPRO  Take 1 tablet (500 mg total) by mouth 2 (two) times daily.     esomeprazole 40 MG capsule  Commonly known as:  NEXIUM  Take 40 mg by mouth daily at 12 noon.     ibuprofen 200 MG tablet  Commonly known as:  ADVIL,MOTRIN  Take 400 mg by mouth every 6 (six) hours as needed for mild pain or moderate pain.     metoprolol tartrate 25 MG tablet  Commonly known as:  LOPRESSOR  Take 25 mg by mouth 2 (two) times daily.     metroNIDAZOLE 250 MG tablet  Commonly known as:  FLAGYL  Take 1 tablet (250 mg total) by mouth 3 (three) times daily.     oxyCODONE-acetaminophen 5-325 MG per tablet  Commonly known as:  PERCOCET/ROXICET  Take 1-2 tablets by mouth every 4 (four) hours as needed for moderate pain.     OXYGEN  Inhale 2 L/mL into the lungs at bedtime.           Follow-up Information    Follow up with [facility].   Why:  home health physical therapy   Contact information:   [address] [place] [address] [phone]       Follow up with [facility], NP On 11/11/2014.   Specialty:  Nurse Practitioner   Why:  3-15 pm  Contact information:   [address] [place] [address] [phone]       Follow up with [facility], MD. Schedule an appointment as soon as possible for a visit in 2 weeks.   Specialty:  General Surgery   Contact information:   [address] [place] [address] [phone]       Signed: [name] 10/23/2014, 4:02 PM

## 2014-11-10 ENCOUNTER — Ambulatory Visit
Admission: RE | Admit: 2014-11-10 | Discharge: 2014-11-10 | Disposition: A | Discharge status: Home / Self Care | Payer: Medicare Other | Source: Ambulatory Visit | Attending: General Surgery | Admitting: General Surgery

## 2014-11-10 ENCOUNTER — Other Ambulatory Visit: Payer: Self-pay | Admitting: General Surgery

## 2014-11-10 DIAGNOSIS — R0602 Shortness of breath: Secondary | ICD-10-CM

## 2014-11-10 DIAGNOSIS — R05 Cough: Secondary | ICD-10-CM | POA: Diagnosis not present

## 2014-11-10 NOTE — Progress Notes (Signed)
Quick Note:  Please let patient know that CXR looks good. ______

## 2014-11-11 ENCOUNTER — Inpatient Hospital Stay: Payer: Medicare Other | Admitting: Adult Health

## 2014-12-01 ENCOUNTER — Inpatient Hospital Stay: Payer: Medicare Other | Admitting: Adult Health

## 2014-12-02 ENCOUNTER — Other Ambulatory Visit: Payer: Self-pay | Admitting: General Surgery

## 2014-12-02 ENCOUNTER — Other Ambulatory Visit: Payer: Self-pay

## 2014-12-02 DIAGNOSIS — K862 Cyst of pancreas: Secondary | ICD-10-CM

## 2014-12-02 NOTE — Addendum Note (Signed)
Addended by: [name] on: 12/02/2014 02:57 PM   Modules accepted: Orders

## 2014-12-03 ENCOUNTER — Other Ambulatory Visit: Payer: Self-pay

## 2014-12-03 DIAGNOSIS — K862 Cyst of pancreas: Secondary | ICD-10-CM

## 2014-12-09 DIAGNOSIS — K219 Gastro-esophageal reflux disease without esophagitis: Secondary | ICD-10-CM | POA: Diagnosis not present

## 2014-12-09 DIAGNOSIS — G629 Polyneuropathy, unspecified: Secondary | ICD-10-CM | POA: Diagnosis not present

## 2014-12-09 DIAGNOSIS — E039 Hypothyroidism, unspecified: Secondary | ICD-10-CM | POA: Diagnosis not present

## 2014-12-09 DIAGNOSIS — E663 Overweight: Secondary | ICD-10-CM | POA: Diagnosis not present

## 2014-12-09 DIAGNOSIS — R634 Abnormal weight loss: Secondary | ICD-10-CM | POA: Diagnosis not present

## 2014-12-09 DIAGNOSIS — R1013 Epigastric pain: Secondary | ICD-10-CM | POA: Diagnosis not present

## 2014-12-09 DIAGNOSIS — Z6827 Body mass index (BMI) 27.0-27.9, adult: Secondary | ICD-10-CM | POA: Diagnosis not present

## 2014-12-09 DIAGNOSIS — I10 Essential (primary) hypertension: Secondary | ICD-10-CM | POA: Diagnosis not present

## 2014-12-11 DIAGNOSIS — E1165 Type 2 diabetes mellitus with hyperglycemia: Secondary | ICD-10-CM | POA: Diagnosis not present

## 2014-12-11 DIAGNOSIS — G629 Polyneuropathy, unspecified: Secondary | ICD-10-CM | POA: Diagnosis not present

## 2014-12-11 DIAGNOSIS — R634 Abnormal weight loss: Secondary | ICD-10-CM | POA: Diagnosis not present

## 2014-12-11 DIAGNOSIS — I10 Essential (primary) hypertension: Secondary | ICD-10-CM | POA: Diagnosis not present

## 2014-12-11 DIAGNOSIS — R1013 Epigastric pain: Secondary | ICD-10-CM | POA: Diagnosis not present

## 2014-12-11 DIAGNOSIS — E039 Hypothyroidism, unspecified: Secondary | ICD-10-CM | POA: Diagnosis not present

## 2014-12-11 DIAGNOSIS — Z6827 Body mass index (BMI) 27.0-27.9, adult: Secondary | ICD-10-CM | POA: Diagnosis not present

## 2014-12-11 DIAGNOSIS — K219 Gastro-esophageal reflux disease without esophagitis: Secondary | ICD-10-CM | POA: Diagnosis not present

## 2014-12-16 DIAGNOSIS — Z1389 Encounter for screening for other disorder: Secondary | ICD-10-CM | POA: Diagnosis not present

## 2014-12-16 DIAGNOSIS — Z6827 Body mass index (BMI) 27.0-27.9, adult: Secondary | ICD-10-CM | POA: Diagnosis not present

## 2014-12-16 DIAGNOSIS — B359 Dermatophytosis, unspecified: Secondary | ICD-10-CM | POA: Diagnosis not present

## 2014-12-16 DIAGNOSIS — E039 Hypothyroidism, unspecified: Secondary | ICD-10-CM | POA: Diagnosis not present

## 2014-12-16 DIAGNOSIS — G629 Polyneuropathy, unspecified: Secondary | ICD-10-CM | POA: Diagnosis not present

## 2014-12-16 DIAGNOSIS — R634 Abnormal weight loss: Secondary | ICD-10-CM | POA: Diagnosis not present

## 2014-12-16 DIAGNOSIS — I10 Essential (primary) hypertension: Secondary | ICD-10-CM | POA: Diagnosis not present

## 2014-12-16 DIAGNOSIS — K219 Gastro-esophageal reflux disease without esophagitis: Secondary | ICD-10-CM | POA: Diagnosis not present

## 2014-12-16 DIAGNOSIS — E1165 Type 2 diabetes mellitus with hyperglycemia: Secondary | ICD-10-CM | POA: Diagnosis not present

## 2014-12-16 DIAGNOSIS — R1013 Epigastric pain: Secondary | ICD-10-CM | POA: Diagnosis not present

## 2014-12-23 DIAGNOSIS — B359 Dermatophytosis, unspecified: Secondary | ICD-10-CM | POA: Diagnosis not present

## 2014-12-23 DIAGNOSIS — R1013 Epigastric pain: Secondary | ICD-10-CM | POA: Diagnosis not present

## 2014-12-23 DIAGNOSIS — E1165 Type 2 diabetes mellitus with hyperglycemia: Secondary | ICD-10-CM | POA: Diagnosis not present

## 2014-12-23 DIAGNOSIS — R634 Abnormal weight loss: Secondary | ICD-10-CM | POA: Diagnosis not present

## 2014-12-23 DIAGNOSIS — E039 Hypothyroidism, unspecified: Secondary | ICD-10-CM | POA: Diagnosis not present

## 2014-12-23 DIAGNOSIS — Z6827 Body mass index (BMI) 27.0-27.9, adult: Secondary | ICD-10-CM | POA: Diagnosis not present

## 2014-12-23 DIAGNOSIS — G629 Polyneuropathy, unspecified: Secondary | ICD-10-CM | POA: Diagnosis not present

## 2014-12-23 DIAGNOSIS — K219 Gastro-esophageal reflux disease without esophagitis: Secondary | ICD-10-CM | POA: Diagnosis not present

## 2014-12-23 DIAGNOSIS — I10 Essential (primary) hypertension: Secondary | ICD-10-CM | POA: Diagnosis not present

## 2015-01-06 DIAGNOSIS — Z23 Encounter for immunization: Secondary | ICD-10-CM | POA: Diagnosis not present

## 2015-01-06 DIAGNOSIS — E1165 Type 2 diabetes mellitus with hyperglycemia: Secondary | ICD-10-CM | POA: Diagnosis not present

## 2015-01-06 DIAGNOSIS — R1013 Epigastric pain: Secondary | ICD-10-CM | POA: Diagnosis not present

## 2015-01-06 DIAGNOSIS — E1140 Type 2 diabetes mellitus with diabetic neuropathy, unspecified: Secondary | ICD-10-CM | POA: Diagnosis not present

## 2015-01-06 DIAGNOSIS — B359 Dermatophytosis, unspecified: Secondary | ICD-10-CM | POA: Diagnosis not present

## 2015-01-06 DIAGNOSIS — Z6827 Body mass index (BMI) 27.0-27.9, adult: Secondary | ICD-10-CM | POA: Diagnosis not present

## 2015-01-06 DIAGNOSIS — I10 Essential (primary) hypertension: Secondary | ICD-10-CM | POA: Diagnosis not present

## 2015-01-06 DIAGNOSIS — R634 Abnormal weight loss: Secondary | ICD-10-CM | POA: Diagnosis not present

## 2015-01-06 DIAGNOSIS — K219 Gastro-esophageal reflux disease without esophagitis: Secondary | ICD-10-CM | POA: Diagnosis not present

## 2015-01-06 DIAGNOSIS — E039 Hypothyroidism, unspecified: Secondary | ICD-10-CM | POA: Diagnosis not present

## 2015-02-02 DIAGNOSIS — Z7984 Long term (current) use of oral hypoglycemic drugs: Secondary | ICD-10-CM | POA: Diagnosis not present

## 2015-02-02 DIAGNOSIS — H43813 Vitreous degeneration, bilateral: Secondary | ICD-10-CM | POA: Diagnosis not present

## 2015-02-02 DIAGNOSIS — H353131 Nonexudative age-related macular degeneration, bilateral, early dry stage: Secondary | ICD-10-CM | POA: Diagnosis not present

## 2015-02-02 DIAGNOSIS — H5203 Hypermetropia, bilateral: Secondary | ICD-10-CM | POA: Diagnosis not present

## 2015-02-02 DIAGNOSIS — H40013 Open angle with borderline findings, low risk, bilateral: Secondary | ICD-10-CM | POA: Diagnosis not present

## 2015-02-02 DIAGNOSIS — H47233 Glaucomatous optic atrophy, bilateral: Secondary | ICD-10-CM | POA: Diagnosis not present

## 2015-02-02 DIAGNOSIS — H43393 Other vitreous opacities, bilateral: Secondary | ICD-10-CM | POA: Diagnosis not present

## 2015-02-02 DIAGNOSIS — H524 Presbyopia: Secondary | ICD-10-CM | POA: Diagnosis not present

## 2015-02-02 DIAGNOSIS — E119 Type 2 diabetes mellitus without complications: Secondary | ICD-10-CM | POA: Diagnosis not present

## 2015-02-02 DIAGNOSIS — I10 Essential (primary) hypertension: Secondary | ICD-10-CM | POA: Diagnosis not present

## 2015-02-02 DIAGNOSIS — H52223 Regular astigmatism, bilateral: Secondary | ICD-10-CM | POA: Diagnosis not present

## 2015-02-02 DIAGNOSIS — H35373 Puckering of macula, bilateral: Secondary | ICD-10-CM | POA: Diagnosis not present

## 2015-02-03 DIAGNOSIS — R1013 Epigastric pain: Secondary | ICD-10-CM | POA: Diagnosis not present

## 2015-02-03 DIAGNOSIS — K863 Pseudocyst of pancreas: Secondary | ICD-10-CM | POA: Diagnosis not present

## 2015-02-03 DIAGNOSIS — K210 Gastro-esophageal reflux disease with esophagitis: Secondary | ICD-10-CM | POA: Diagnosis not present

## 2015-02-05 DIAGNOSIS — E039 Hypothyroidism, unspecified: Secondary | ICD-10-CM | POA: Diagnosis not present

## 2015-02-05 DIAGNOSIS — E1165 Type 2 diabetes mellitus with hyperglycemia: Secondary | ICD-10-CM | POA: Diagnosis not present

## 2015-02-05 DIAGNOSIS — K219 Gastro-esophageal reflux disease without esophagitis: Secondary | ICD-10-CM | POA: Diagnosis not present

## 2015-02-05 DIAGNOSIS — R634 Abnormal weight loss: Secondary | ICD-10-CM | POA: Diagnosis not present

## 2015-02-05 DIAGNOSIS — R1013 Epigastric pain: Secondary | ICD-10-CM | POA: Diagnosis not present

## 2015-02-05 DIAGNOSIS — I10 Essential (primary) hypertension: Secondary | ICD-10-CM | POA: Diagnosis not present

## 2015-02-05 DIAGNOSIS — Z6828 Body mass index (BMI) 28.0-28.9, adult: Secondary | ICD-10-CM | POA: Diagnosis not present

## 2015-02-05 DIAGNOSIS — E1140 Type 2 diabetes mellitus with diabetic neuropathy, unspecified: Secondary | ICD-10-CM | POA: Diagnosis not present

## 2015-02-05 DIAGNOSIS — B359 Dermatophytosis, unspecified: Secondary | ICD-10-CM | POA: Diagnosis not present

## 2015-03-19 DIAGNOSIS — E1165 Type 2 diabetes mellitus with hyperglycemia: Secondary | ICD-10-CM | POA: Diagnosis not present

## 2015-03-19 DIAGNOSIS — R1013 Epigastric pain: Secondary | ICD-10-CM | POA: Diagnosis not present

## 2015-03-19 DIAGNOSIS — Z6829 Body mass index (BMI) 29.0-29.9, adult: Secondary | ICD-10-CM | POA: Diagnosis not present

## 2015-03-19 DIAGNOSIS — K219 Gastro-esophageal reflux disease without esophagitis: Secondary | ICD-10-CM | POA: Diagnosis not present

## 2015-03-19 DIAGNOSIS — R634 Abnormal weight loss: Secondary | ICD-10-CM | POA: Diagnosis not present

## 2015-03-19 DIAGNOSIS — I10 Essential (primary) hypertension: Secondary | ICD-10-CM | POA: Diagnosis not present

## 2015-03-19 DIAGNOSIS — B359 Dermatophytosis, unspecified: Secondary | ICD-10-CM | POA: Diagnosis not present

## 2015-03-19 DIAGNOSIS — E039 Hypothyroidism, unspecified: Secondary | ICD-10-CM | POA: Diagnosis not present

## 2015-03-19 DIAGNOSIS — E1140 Type 2 diabetes mellitus with diabetic neuropathy, unspecified: Secondary | ICD-10-CM | POA: Diagnosis not present

## 2015-05-04 DIAGNOSIS — R1013 Epigastric pain: Secondary | ICD-10-CM | POA: Diagnosis not present

## 2015-05-04 DIAGNOSIS — I10 Essential (primary) hypertension: Secondary | ICD-10-CM | POA: Diagnosis not present

## 2015-05-04 DIAGNOSIS — E039 Hypothyroidism, unspecified: Secondary | ICD-10-CM | POA: Diagnosis not present

## 2015-05-04 DIAGNOSIS — E1165 Type 2 diabetes mellitus with hyperglycemia: Secondary | ICD-10-CM | POA: Diagnosis not present

## 2015-05-04 DIAGNOSIS — Z6829 Body mass index (BMI) 29.0-29.9, adult: Secondary | ICD-10-CM | POA: Diagnosis not present

## 2015-05-04 DIAGNOSIS — B359 Dermatophytosis, unspecified: Secondary | ICD-10-CM | POA: Diagnosis not present

## 2015-05-04 DIAGNOSIS — K219 Gastro-esophageal reflux disease without esophagitis: Secondary | ICD-10-CM | POA: Diagnosis not present

## 2015-05-04 DIAGNOSIS — R634 Abnormal weight loss: Secondary | ICD-10-CM | POA: Diagnosis not present

## 2015-05-04 DIAGNOSIS — E1140 Type 2 diabetes mellitus with diabetic neuropathy, unspecified: Secondary | ICD-10-CM | POA: Diagnosis not present

## 2015-06-01 DIAGNOSIS — R1013 Epigastric pain: Secondary | ICD-10-CM | POA: Diagnosis not present

## 2015-06-01 DIAGNOSIS — I10 Essential (primary) hypertension: Secondary | ICD-10-CM | POA: Diagnosis not present

## 2015-06-01 DIAGNOSIS — E039 Hypothyroidism, unspecified: Secondary | ICD-10-CM | POA: Diagnosis not present

## 2015-06-01 DIAGNOSIS — E1140 Type 2 diabetes mellitus with diabetic neuropathy, unspecified: Secondary | ICD-10-CM | POA: Diagnosis not present

## 2015-06-01 DIAGNOSIS — B359 Dermatophytosis, unspecified: Secondary | ICD-10-CM | POA: Diagnosis not present

## 2015-06-01 DIAGNOSIS — J209 Acute bronchitis, unspecified: Secondary | ICD-10-CM | POA: Diagnosis not present

## 2015-06-01 DIAGNOSIS — K219 Gastro-esophageal reflux disease without esophagitis: Secondary | ICD-10-CM | POA: Diagnosis not present

## 2015-06-01 DIAGNOSIS — E1165 Type 2 diabetes mellitus with hyperglycemia: Secondary | ICD-10-CM | POA: Diagnosis not present

## 2015-06-01 DIAGNOSIS — Z6829 Body mass index (BMI) 29.0-29.9, adult: Secondary | ICD-10-CM | POA: Diagnosis not present

## 2015-06-01 DIAGNOSIS — R634 Abnormal weight loss: Secondary | ICD-10-CM | POA: Diagnosis not present

## 2015-06-08 DIAGNOSIS — D126 Benign neoplasm of colon, unspecified: Secondary | ICD-10-CM | POA: Diagnosis not present

## 2015-06-08 DIAGNOSIS — K6389 Other specified diseases of intestine: Secondary | ICD-10-CM | POA: Diagnosis not present

## 2015-06-08 DIAGNOSIS — Z86010 Personal history of colonic polyps: Secondary | ICD-10-CM | POA: Diagnosis not present

## 2015-06-08 DIAGNOSIS — D123 Benign neoplasm of transverse colon: Secondary | ICD-10-CM | POA: Diagnosis not present

## 2015-07-02 DIAGNOSIS — E039 Hypothyroidism, unspecified: Secondary | ICD-10-CM | POA: Diagnosis not present

## 2015-07-02 DIAGNOSIS — Z6828 Body mass index (BMI) 28.0-28.9, adult: Secondary | ICD-10-CM | POA: Diagnosis not present

## 2015-07-02 DIAGNOSIS — R1013 Epigastric pain: Secondary | ICD-10-CM | POA: Diagnosis not present

## 2015-07-02 DIAGNOSIS — R634 Abnormal weight loss: Secondary | ICD-10-CM | POA: Diagnosis not present

## 2015-07-02 DIAGNOSIS — I10 Essential (primary) hypertension: Secondary | ICD-10-CM | POA: Diagnosis not present

## 2015-07-02 DIAGNOSIS — K219 Gastro-esophageal reflux disease without esophagitis: Secondary | ICD-10-CM | POA: Diagnosis not present

## 2015-07-02 DIAGNOSIS — B359 Dermatophytosis, unspecified: Secondary | ICD-10-CM | POA: Diagnosis not present

## 2015-07-02 DIAGNOSIS — E1165 Type 2 diabetes mellitus with hyperglycemia: Secondary | ICD-10-CM | POA: Diagnosis not present

## 2015-07-02 DIAGNOSIS — E1140 Type 2 diabetes mellitus with diabetic neuropathy, unspecified: Secondary | ICD-10-CM | POA: Diagnosis not present

## 2015-07-02 DIAGNOSIS — E663 Overweight: Secondary | ICD-10-CM | POA: Diagnosis not present

## 2015-07-03 DIAGNOSIS — J441 Chronic obstructive pulmonary disease with (acute) exacerbation: Secondary | ICD-10-CM | POA: Diagnosis not present

## 2015-07-06 DIAGNOSIS — J441 Chronic obstructive pulmonary disease with (acute) exacerbation: Secondary | ICD-10-CM | POA: Diagnosis not present

## 2015-07-09 ENCOUNTER — Emergency Department (HOSPITAL_COMMUNITY)
Admission: EM | Admit: 2015-07-09 | Discharge: 2015-07-09 | Disposition: A | Discharge status: Home / Self Care | Payer: Medicare Other | Attending: Emergency Medicine | Admitting: Emergency Medicine

## 2015-07-09 ENCOUNTER — Emergency Department (HOSPITAL_COMMUNITY): Payer: Medicare Other

## 2015-07-09 ENCOUNTER — Encounter (HOSPITAL_COMMUNITY): Payer: Self-pay | Admitting: Emergency Medicine

## 2015-07-09 DIAGNOSIS — R109 Unspecified abdominal pain: Secondary | ICD-10-CM

## 2015-07-09 DIAGNOSIS — Z7984 Long term (current) use of oral hypoglycemic drugs: Secondary | ICD-10-CM | POA: Insufficient documentation

## 2015-07-09 DIAGNOSIS — K8689 Other specified diseases of pancreas: Secondary | ICD-10-CM | POA: Insufficient documentation

## 2015-07-09 DIAGNOSIS — Z85828 Personal history of other malignant neoplasm of skin: Secondary | ICD-10-CM | POA: Insufficient documentation

## 2015-07-09 DIAGNOSIS — I10 Essential (primary) hypertension: Secondary | ICD-10-CM | POA: Insufficient documentation

## 2015-07-09 DIAGNOSIS — F17210 Nicotine dependence, cigarettes, uncomplicated: Secondary | ICD-10-CM | POA: Diagnosis not present

## 2015-07-09 DIAGNOSIS — R05 Cough: Secondary | ICD-10-CM | POA: Diagnosis not present

## 2015-07-09 DIAGNOSIS — K219 Gastro-esophageal reflux disease without esophagitis: Secondary | ICD-10-CM | POA: Diagnosis not present

## 2015-07-09 DIAGNOSIS — R0789 Other chest pain: Secondary | ICD-10-CM | POA: Diagnosis not present

## 2015-07-09 DIAGNOSIS — I252 Old myocardial infarction: Secondary | ICD-10-CM | POA: Diagnosis not present

## 2015-07-09 DIAGNOSIS — Z90411 Acquired partial absence of pancreas: Secondary | ICD-10-CM | POA: Insufficient documentation

## 2015-07-09 DIAGNOSIS — J441 Chronic obstructive pulmonary disease with (acute) exacerbation: Secondary | ICD-10-CM | POA: Insufficient documentation

## 2015-07-09 DIAGNOSIS — G8929 Other chronic pain: Secondary | ICD-10-CM | POA: Insufficient documentation

## 2015-07-09 DIAGNOSIS — R1084 Generalized abdominal pain: Secondary | ICD-10-CM | POA: Diagnosis present

## 2015-07-09 DIAGNOSIS — R0602 Shortness of breath: Secondary | ICD-10-CM | POA: Diagnosis not present

## 2015-07-09 DIAGNOSIS — K869 Disease of pancreas, unspecified: Secondary | ICD-10-CM | POA: Diagnosis not present

## 2015-07-09 DIAGNOSIS — Z79899 Other long term (current) drug therapy: Secondary | ICD-10-CM | POA: Insufficient documentation

## 2015-07-09 LAB — CBC
HEMATOCRIT: 44.1 % (ref 39.0–52.0)
HEMOGLOBIN: 15.4 g/dL (ref 13.0–17.0)
MCH: 29.8 pg (ref 26.0–34.0)
MCHC: 34.9 g/dL (ref 30.0–36.0)
MCV: 85.3 fL (ref 78.0–100.0)
Platelets: 411 10*3/uL — ABNORMAL HIGH (ref 150–400)
RBC: 5.17 MIL/uL (ref 4.22–5.81)
RDW: 15.7 % — ABNORMAL HIGH (ref 11.5–15.5)
WBC: 12.8 10*3/uL — ABNORMAL HIGH (ref 4.0–10.5)

## 2015-07-09 LAB — URINALYSIS, ROUTINE W REFLEX MICROSCOPIC
Bilirubin Urine: NEGATIVE
Glucose, UA: NEGATIVE mg/dL
Hgb urine dipstick: NEGATIVE
Ketones, ur: NEGATIVE mg/dL
Leukocytes, UA: NEGATIVE
NITRITE: NEGATIVE
Protein, ur: NEGATIVE mg/dL
SPECIFIC GRAVITY, URINE: 1.025 (ref 1.005–1.030)
pH: 6.5 (ref 5.0–8.0)

## 2015-07-09 LAB — COMPREHENSIVE METABOLIC PANEL
ALK PHOS: 56 U/L (ref 38–126)
ALT: 28 U/L (ref 17–63)
ANION GAP: 10 (ref 5–15)
AST: 22 U/L (ref 15–41)
Albumin: 4.3 g/dL (ref 3.5–5.0)
BILIRUBIN TOTAL: 0.7 mg/dL (ref 0.3–1.2)
BUN: 17 mg/dL (ref 6–20)
CALCIUM: 9.8 mg/dL (ref 8.9–10.3)
CO2: 26 mmol/L (ref 22–32)
Chloride: 100 mmol/L — ABNORMAL LOW (ref 101–111)
Creatinine, Ser: 0.76 mg/dL (ref 0.61–1.24)
GFR calc non Af Amer: >60 mL/min (ref >60)
GLUCOSE: 173 mg/dL — AB (ref 65–99)
Potassium: 4.9 mmol/L (ref 3.5–5.1)
Sodium: 136 mmol/L (ref 135–145)
TOTAL PROTEIN: 8.2 g/dL — AB (ref 6.5–8.1)

## 2015-07-09 LAB — LIPASE, BLOOD: Lipase: 27 U/L (ref 11–51)

## 2015-07-09 MED ORDER — IOPAMIDOL (ISOVUE-300) INJECTION 61%
100.0000 mL | Freq: Once | INTRAVENOUS | Status: AC | PRN
  Administered 2015-07-09: 100 mL via INTRAVENOUS

## 2015-07-09 MED ORDER — DIPHENHYDRAMINE HCL 50 MG/ML IJ SOLN
25.0000 mg | Freq: Once | INTRAMUSCULAR | Status: AC
  Administered 2015-07-09: 25 mg via INTRAVENOUS
  Filled 2015-07-09: qty 1

## 2015-07-09 MED ORDER — HYDROMORPHONE HCL 1 MG/ML IJ SOLN
1.0000 mg | Freq: Once | INTRAMUSCULAR | Status: AC
  Administered 2015-07-09: 1 mg via INTRAVENOUS
  Filled 2015-07-09: qty 1

## 2015-07-09 MED ORDER — AZITHROMYCIN 250 MG PO TABS: 250.0000 mg | ORAL_TABLET | Freq: Every day | ORAL | Status: DC

## 2015-07-09 MED ORDER — OXYCODONE-ACETAMINOPHEN 5-325 MG PO TABS: 1.0000 | ORAL_TABLET | ORAL | Status: DC | PRN

## 2015-07-09 MED ORDER — METHYLPREDNISOLONE SODIUM SUCC 125 MG IJ SOLR
125.0000 mg | Freq: Once | INTRAMUSCULAR | Status: AC
  Administered 2015-07-09: 125 mg via INTRAVENOUS
  Filled 2015-07-09: qty 2

## 2015-07-09 MED ORDER — ALBUTEROL SULFATE (2.5 MG/3ML) 0.083% IN NEBU
5.0000 mg | INHALATION_SOLUTION | Freq: Once | RESPIRATORY_TRACT | Status: AC
  Administered 2015-07-09: 5 mg via RESPIRATORY_TRACT
  Filled 2015-07-09: qty 6

## 2015-07-09 MED ORDER — IPRATROPIUM BROMIDE 0.02 % IN SOLN
0.5000 mg | Freq: Once | RESPIRATORY_TRACT | Status: AC
  Administered 2015-07-09: 0.5 mg via RESPIRATORY_TRACT
  Filled 2015-07-09: qty 2.5

## 2015-07-09 NOTE — ED Provider Notes (Signed)
CSN: [id number]     Arrival date & time 07/09/15  1128 History   None    Chief Complaint  Patient presents with  . left rib pain   . Abdominal Pain  . Back Pain    HPI   Dalton Morris is a 60 y.o. male with a PMH of HTN, MI, COPD, chronic back pain who presents to the ED with cough, left sided flank pain, and diffuse abdominal pain x 3 weeks. He notes his symptoms worsened last night. He states he went to urgent care prior to arrival and was sent to the ED for CT scan of his abdomen given his history of abdominal surgery. He denies exacerbating factors. He has not tried anything for symptom relief. He also notes back pain, which he states is consistent with his chronic back pain. He denies fever, chills, chest pain, shortness of breath, N/V/D/C, dysuria, urgency, frequency, numbness, weakness, paresthesia, bowel or bladder incontinence, saddle anesthesia.   Past Medical History  Diagnosis Date  . Hypertension   . Pain     "chronic pain" -back  . Myocardial infarction [facility])     MI -3 yrs ago (mild)  . History of oxygen administration     2l/m per nasally at bedtime as needed(in use for 4 months)  . Umbilical hernia     at present  . H/O dizziness     intermitant episodes - worse with motion   . Abdominal pain     " left side- beneath left ribcage"  . Shortness of breath dyspnea     07-22-14 aubible wheezes while talking with pt today. Uses nebulizer-started a week ago after hospital visit [facility] for SOB.  Marland Kitchen COPD (chronic obstructive pulmonary disease) ([place])   . Chronic back pain   . Abdominal bloating   . Finger pain     rt middle finger pain intermitant  . History of skin cancer   . GERD (gastroesophageal reflux disease)   . Cancer [facility])     history skin cancer   Past Surgical History  Procedure Laterality Date  . Orif fibula fracture Left   . Colonoscopy    . Esophagogastroduodenoscopy endoscopy    . Tonsillectomy      child  . Eus N/A 07/24/2014   Procedure: UPPER ENDOSCOPIC ULTRASOUND (EUS) LINEAR;  Surgeon: Milus Banister, MD;  Location: [facility] ENDOSCOPY;  Service: Endoscopy;  Laterality: N/A;  . Pancreatectomy N/A 10/09/2014    Procedure: LAPAROSCOPIC CONVERTED TO OPEN PANCREATECTOMY, SPLENECTOMY, REPAIR UMBILICAL HERNIA;  Surgeon: [name], MD;  Location: [facility] ORS;  Service: General;  Laterality: N/A;  . Umbilical hernia repair N/A 10/09/2014    Procedure:  UMBILICAL HERNIA REPAIR ;  Surgeon: [name], MD;  Location: [facility] ORS;  Service: General;  Laterality: N/A;   Family History  Problem Relation Age of Onset  . Seizures Mother   . Thyroid disease Mother   . Diabetes Mellitus II Mother   . Hypertension Father    Social History  Substance Use Topics  . Smoking status: Current Every Day Smoker -- 0.50 packs/day for 40 years    Types: Cigarettes  . Smokeless tobacco: None     Comment: trying to quit  . Alcohol Use: No     Comment: none in many years      Review of Systems  Constitutional: Negative for fever and chills.  Respiratory: Positive for cough. Negative for shortness of breath.   Cardiovascular: Negative for chest pain.  Gastrointestinal:  Positive for abdominal pain. Negative for nausea, vomiting, diarrhea and constipation.  Genitourinary: Positive for flank pain. Negative for dysuria, urgency, frequency and hematuria.  Musculoskeletal: Positive for back pain.  Neurological: Negative for weakness and numbness.  All other systems reviewed and are negative.     Allergies  Codeine; Contrast media; Gabapentin; and Hydrocodone  Home Medications   Prior to Admission medications   Medication Sig Start Date End Date Taking? Authorizing Provider  albuterol (PROVENTIL) (2.5 MG/3ML) 0.083% nebulizer solution Take 2.5 mg by nebulization 2 (two) times daily.   Yes Historical Provider, MD  glimepiride (AMARYL) 4 MG tablet Take 4 mg by mouth 2 (two) times daily. 06/26/15  Yes Historical Provider, MD  levothyroxine  (SYNTHROID, LEVOTHROID) 50 MCG tablet Take 50 mcg by mouth daily. 06/15/15  Yes Historical Provider, MD  metFORMIN (GLUCOPHAGE) 850 MG tablet Take 850 mg by mouth 3 (three) times daily. 06/16/15  Yes Historical Provider, MD  metoprolol tartrate (LOPRESSOR) 25 MG tablet Take 25 mg by mouth every morning.    Yes Historical Provider, MD  omeprazole (PRILOSEC) 40 MG capsule Take 40 mg by mouth daily. 06/15/15  Yes Historical Provider, MD  OXYGEN Inhale 2 L/mL into the lungs at bedtime.   Yes Historical Provider, MD  azithromycin (ZITHROMAX) 250 MG tablet Take 1 tablet (250 mg total) by mouth daily. Take first 2 tablets together, then 1 every day until finished. 07/09/15   Marella Chimes, PA-C  oxyCODONE-acetaminophen (PERCOCET/ROXICET) 5-325 MG tablet Take 1-2 tablets by mouth every 4 (four) hours as needed for severe pain. 07/09/15   Guadelupe Sabin Kashtyn Jankowski, PA-C    BP 131/88 mmHg  Pulse 87  Temp(Src) 98.3 F (36.8 C) (Oral)  Resp 20  SpO2 96% Physical Exam  Constitutional: He is oriented to person, place, and time. He appears well-developed and well-nourished. No distress.  HENT:  Head: Normocephalic and atraumatic.  Right Ear: External ear normal.  Left Ear: External ear normal.  Nose: Nose normal.  Mouth/Throat: Uvula is midline, oropharynx is clear and moist and mucous membranes are normal.  Eyes: Conjunctivae, EOM and lids are normal. Pupils are equal, round, and reactive to light. Right eye exhibits no discharge. Left eye exhibits no discharge. No scleral icterus.  Neck: Normal range of motion. Neck supple.  Cardiovascular: Normal rate, regular rhythm, normal heart sounds, intact distal pulses and normal pulses.   Pulmonary/Chest: Effort normal and breath sounds normal. No respiratory distress. He has no wheezes. He has no rales.  Diffuse end expiratory wheezing to lung fields bilaterally. No increased work of breathing or respiratory distress.  Abdominal: Soft. Normal appearance and bowel  sounds are normal. He exhibits no distension and no mass. There is tenderness. There is no rigidity, no rebound, no guarding and no CVA tenderness.  Diffuse TTP to left flank and abdomen. No rebound, guarding, or masses. No CVA tenderness.  Musculoskeletal: Normal range of motion. He exhibits no edema or tenderness.       Back:  TTP to bilateral lumbar paraspinal muscles. No midline tenderness, step-off, or deformity.  Neurological: He is alert and oriented to person, place, and time. He has normal strength and normal reflexes. No cranial nerve deficit or sensory deficit.  Skin: Skin is warm, dry and intact. No rash noted. He is not diaphoretic. No erythema. No pallor.  Psychiatric: He has a normal mood and affect. His speech is normal and behavior is normal.  Nursing note and vitals reviewed.   ED Course  Procedures (  including critical care time)  Labs Review Labs Reviewed  COMPREHENSIVE METABOLIC PANEL - Abnormal; Notable for the following:    Chloride 100 (*)    Glucose, Bld 173 (*)    Total Protein 8.2 (*)    All other components within normal limits  CBC - Abnormal; Notable for the following:    WBC 12.8 (*)    RDW 15.7 (*)    Platelets 411 (*)    All other components within normal limits  LIPASE, BLOOD  URINALYSIS, ROUTINE W REFLEX MICROSCOPIC (NOT AT [facility])    Imaging Review Dg Chest 2 View  07/09/2015  CLINICAL DATA:  60 year old male with cough and wheezing for several weeks. Shortness of breath. Initial encounter. EXAM: CHEST  2 VIEW COMPARISON:  04/26/1978 and earlier. FINDINGS: Stable lung volumes, within normal limits. Normal cardiac size and mediastinal contours. Visualized tracheal air column is within normal limits. No pneumothorax, pulmonary edema, pleural effusion or confluent pulmonary opacity. Increased basilar reticulonodular interstitial opacity greater on the left. No acute osseous abnormality identified. IMPRESSION: Mildly increased interstitial markings at the  lung bases greater on the left, suspicious for viral or atypical respiratory infection. Electronically Signed   By: Genevie Ann M.D.   On: 07/09/2015 16:24   Ct Abdomen Pelvis W Contrast  07/09/2015  CLINICAL DATA:  60 year old male with abdominal pain 3 weeks ago, and back pain for the past 2 weeks. Left-sided rib pain for 1 week. No associated nausea, emesis or diarrhea. EXAM: CT ABDOMEN AND PELVIS WITH CONTRAST TECHNIQUE: Multidetector CT imaging of the abdomen and pelvis was performed using the standard protocol following bolus administration of intravenous contrast. CONTRAST:  175mL ISOVUE-300 IOPAMIDOL (ISOVUE-300) INJECTION 61% COMPARISON:  CT the abdomen and pelvis 08/22/2014. FINDINGS: Lower chest: Atherosclerotic calcifications in the left circumflex and right coronary artery territories. Hepatobiliary: Diffuse low attenuation throughout the hepatic parenchyma, compatible with a background of hepatic steatosis. No cystic or solid hepatic lesions. No intra or extrahepatic biliary ductal dilatation. Gallbladder is normal in appearance. Pancreas: Compared to the prior examinations, the large cystic mass in the distal body and tail of the pancreas has significantly increased in size, currently measuring up to 12.8 x 14.0 x 13.5 cm (axial image 21 of series 2 and coronal image 58 of series 3). This mass continues to be heterogeneous in attenuation, with both internal fluid and fatty attenuation areas. The lesion demonstrates a thick peripheral wall which enhances, and there appears to be some internal septations and complex architecture. The lesion extends cephalad and is intimately associated with the undersurface of the proximal body and fundus of the stomach. Within the head of the pancreas the pancreatic duct does not appear dilated. No peripancreatic inflammatory changes. Spleen: Spleen is no longer visualized, presumably resected since the prior examination from 08/22/2014. Adrenals/Urinary Tract:  Bilateral kidneys and bilateral adrenal glands are normal in appearance. Stomach/Bowel: Significant mass effect distorts the fundus and proximal body of the stomach related to the large pancreatic mass, as discussed above. Remaining portions of the stomach are otherwise unremarkable in appearance. No pathologic dilatation of small bowel or colon. Normal appendix. Vascular/Lymphatic: Atherosclerosis throughout the abdominal and pelvic vasculature, without evidence of aneurysm or dissection. No lymphadenopathy noted in the abdomen or pelvis. Reproductive: Prostate gland and seminal vesicles are unremarkable in appearance. Other: No significant volume of ascites.  No pneumoperitoneum. Musculoskeletal: There are no aggressive appearing lytic or blastic lesions noted in the visualized portions of the skeleton. IMPRESSION: 1. Significant enlargement of the  previously noted cystic mass in the distal body and tail of the pancreas. This mass has indeterminate imaging characteristics, but is concerning for neoplasm. Conceivably, this could represent a chronic pancreatic pseudocyst with saponification of fat internally, however, that is a diagnosis of exclusion. Further evaluation with endoscopic ultrasound and potential biopsy is strongly recommended at this time. 2. Hepatic steatosis. 3. 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 4. Atherosclerosis, including at least 2 vessel coronary artery disease. Please note that although the presence of coronary artery calcium documents the presence of coronary artery disease, the severity of this disease and any potential stenosis cannot be assessed on this non-gated CT examination. Assessment for potential risk factor modification, dietary therapy or pharmacologic therapy may be warranted, if clinically indicated. 5. Normal appendix. 6. Additional incidental findings, as above. Electronically Signed   By: Vinnie Langton M.D.   On:  07/09/2015 17:35   I have personally reviewed and evaluated these images and lab results as part of my medical decision-making.   EKG Interpretation   Date/Time:  Thursday July 09 2015 16:30:24 EDT Ventricular Rate:  89 PR Interval:  142 QRS Duration: 97 QT Interval:  365 QTC Calculation: 444 R Axis:   -76 Text Interpretation:  Sinus rhythm Consider right ventricular hypertrophy  Inferior infarct, old Baseline wander in lead(s) II III aVF No previous  ECGs available Confirmed by Christy Gentles  MD, DONALD ([id number]) on 07/09/2015  4:46:15 PM      MDM   Final diagnoses:  Abdominal pain  Pancreatic mass    60 year old male presents with cough, left flank pain, and diffuse abdominal pain x 3 weeks. Also notes back pain, which he states is consistent with his chronic back pain. Denies fever, chills, chest pain, shortness of breath, N/V/D/C, dysuria, urgency, frequency, numbness, weakness, paresthesia, bowel or bladder incontinence, saddle anesthesia. Patient states he was sent from urgent care for further evaluation of symptoms given history of abdominal surgery.   Patient is afebrile. Vital signs stable. Heart RRR. Diffuse wheezing to lung fields bilaterally. No increased work of breathing or respiratory distress. Abdomen soft, non-distended, with diffuse TTP to left flank and abdomen. No rebound, guarding, or masses. No CVA tenderness. TTP to lumbar paraspinal muscles bilaterally. No midline tenderness, step-off, or deformity. Strength, sensation, DTRs intact.  CBC remarkable for leukocytosis of 12.8, no anemia. CMP unremarkable. Lipase within normal limits. UA negative for infection.   Will give breathing treatment given wheezing. Will obtain CXR and CT abdomen pelvis. Patient states he had hives last time he received contrast and was told he needs benadryl prior to receiving CT. Per record review, radiology report of CT 08/2014 mentions mild contrast reaction and to consider steroid  administration prior to imaging. Will give benadryl, solumedrol, and dilaudid.  CXR remarkable for mildly increased interstitial markings at the lung bases on the left, suspicious for viral or atypical respiratory infection. Will give azithromycin. CT abdomen pelvis remarkable for significant enlargement of the previously noted cystic mass in the distal body and tail of the pancreas, concerning for neoplasm vs chronic pancreatic pseudocyst, further evaluation with endoscopic [place] and biopsy strongly recommended. Patient discussed with and seen by Dr. Christy Gentles. Spoke with patient regarding findings. He reports significant improvement in symptoms and is non-toxic and well-appearing, feel he is stable for discharge at this time. Will give pain medication for home. Patient to follow-up closely with GI. Strict return precautions discussed. Patient verbalizes his understanding and is in  agreement with plan.   BP 131/88 mmHg  Pulse 87  Temp(Src) 98.3 F (36.8 C) (Oral)  Resp 20  SpO2 96%       Marella Chimes, PA-C 07/09/15 2358  Ripley Fraise, MD 07/11/15 [phone]

## 2015-07-09 NOTE — Discharge Instructions (Signed)
1. Medications: azithromycin for lung infection, percocet for pain, usual home medications 2. Treatment: rest, drink plenty of fluids 3. Follow Up: please followup with your primary doctor and with GI (call tomorrow to make appt) for discussion of your diagnoses and further evaluation of the mass in your pancreas after today's visit; please return to the ER for high fever, shortness of breath, increased pain, persistent vomiting, new or worsening symptoms

## 2015-07-09 NOTE — ED Notes (Signed)
Pt reports abd pain 3 week ago , back pain x 2 weeks, left ribs pain x 1 week . denies nausea nor emesis, no diarrhea. Was seen at [facility] prior to arrival and was sent to ER for CT exam. Denies urinary symptoms. Alert and oriented x 4.

## 2015-07-09 NOTE — ED Notes (Signed)
Patient transported to CT 

## 2015-07-13 DIAGNOSIS — R634 Abnormal weight loss: Secondary | ICD-10-CM | POA: Diagnosis not present

## 2015-07-13 DIAGNOSIS — I10 Essential (primary) hypertension: Secondary | ICD-10-CM | POA: Diagnosis not present

## 2015-07-13 DIAGNOSIS — K219 Gastro-esophageal reflux disease without esophagitis: Secondary | ICD-10-CM | POA: Diagnosis not present

## 2015-07-13 DIAGNOSIS — K869 Disease of pancreas, unspecified: Secondary | ICD-10-CM | POA: Diagnosis not present

## 2015-07-13 DIAGNOSIS — B359 Dermatophytosis, unspecified: Secondary | ICD-10-CM | POA: Diagnosis not present

## 2015-07-13 DIAGNOSIS — Z6828 Body mass index (BMI) 28.0-28.9, adult: Secondary | ICD-10-CM | POA: Diagnosis not present

## 2015-07-13 DIAGNOSIS — R1013 Epigastric pain: Secondary | ICD-10-CM | POA: Diagnosis not present

## 2015-07-13 DIAGNOSIS — E1165 Type 2 diabetes mellitus with hyperglycemia: Secondary | ICD-10-CM | POA: Diagnosis not present

## 2015-07-13 DIAGNOSIS — E039 Hypothyroidism, unspecified: Secondary | ICD-10-CM | POA: Diagnosis not present

## 2015-07-27 DIAGNOSIS — R634 Abnormal weight loss: Secondary | ICD-10-CM | POA: Diagnosis not present

## 2015-07-27 DIAGNOSIS — E1140 Type 2 diabetes mellitus with diabetic neuropathy, unspecified: Secondary | ICD-10-CM | POA: Diagnosis not present

## 2015-07-27 DIAGNOSIS — K219 Gastro-esophageal reflux disease without esophagitis: Secondary | ICD-10-CM | POA: Diagnosis not present

## 2015-07-27 DIAGNOSIS — K869 Disease of pancreas, unspecified: Secondary | ICD-10-CM | POA: Diagnosis not present

## 2015-07-27 DIAGNOSIS — I10 Essential (primary) hypertension: Secondary | ICD-10-CM | POA: Diagnosis not present

## 2015-07-27 DIAGNOSIS — E1165 Type 2 diabetes mellitus with hyperglycemia: Secondary | ICD-10-CM | POA: Diagnosis not present

## 2015-07-27 DIAGNOSIS — E663 Overweight: Secondary | ICD-10-CM | POA: Diagnosis not present

## 2015-07-27 DIAGNOSIS — E039 Hypothyroidism, unspecified: Secondary | ICD-10-CM | POA: Diagnosis not present

## 2015-07-27 DIAGNOSIS — R1013 Epigastric pain: Secondary | ICD-10-CM | POA: Diagnosis not present

## 2015-07-27 DIAGNOSIS — B359 Dermatophytosis, unspecified: Secondary | ICD-10-CM | POA: Diagnosis not present

## 2015-07-27 DIAGNOSIS — Z6828 Body mass index (BMI) 28.0-28.9, adult: Secondary | ICD-10-CM | POA: Diagnosis not present

## 2015-07-30 DIAGNOSIS — I2699 Other pulmonary embolism without acute cor pulmonale: Secondary | ICD-10-CM | POA: Diagnosis not present

## 2015-07-30 DIAGNOSIS — A419 Sepsis, unspecified organism: Secondary | ICD-10-CM | POA: Diagnosis not present

## 2015-07-30 DIAGNOSIS — R072 Precordial pain: Secondary | ICD-10-CM | POA: Diagnosis not present

## 2015-07-30 DIAGNOSIS — R110 Nausea: Secondary | ICD-10-CM | POA: Diagnosis not present

## 2015-07-30 DIAGNOSIS — R0602 Shortness of breath: Secondary | ICD-10-CM | POA: Diagnosis not present

## 2015-07-30 DIAGNOSIS — J189 Pneumonia, unspecified organism: Secondary | ICD-10-CM | POA: Diagnosis not present

## 2015-07-30 DIAGNOSIS — K862 Cyst of pancreas: Secondary | ICD-10-CM | POA: Diagnosis not present

## 2015-07-30 DIAGNOSIS — I252 Old myocardial infarction: Secondary | ICD-10-CM | POA: Diagnosis not present

## 2015-07-30 DIAGNOSIS — K863 Pseudocyst of pancreas: Secondary | ICD-10-CM | POA: Diagnosis not present

## 2015-07-30 DIAGNOSIS — R918 Other nonspecific abnormal finding of lung field: Secondary | ICD-10-CM | POA: Diagnosis not present

## 2015-08-01 DIAGNOSIS — D72829 Elevated white blood cell count, unspecified: Secondary | ICD-10-CM | POA: Diagnosis not present

## 2015-08-01 DIAGNOSIS — K219 Gastro-esophageal reflux disease without esophagitis: Secondary | ICD-10-CM | POA: Diagnosis present

## 2015-08-01 DIAGNOSIS — E119 Type 2 diabetes mellitus without complications: Secondary | ICD-10-CM | POA: Diagnosis not present

## 2015-08-01 DIAGNOSIS — R072 Precordial pain: Secondary | ICD-10-CM | POA: Diagnosis present

## 2015-08-01 DIAGNOSIS — E039 Hypothyroidism, unspecified: Secondary | ICD-10-CM | POA: Diagnosis not present

## 2015-08-01 DIAGNOSIS — R918 Other nonspecific abnormal finding of lung field: Secondary | ICD-10-CM | POA: Diagnosis not present

## 2015-08-01 DIAGNOSIS — Z79899 Other long term (current) drug therapy: Secondary | ICD-10-CM | POA: Diagnosis not present

## 2015-08-01 DIAGNOSIS — I252 Old myocardial infarction: Secondary | ICD-10-CM | POA: Diagnosis not present

## 2015-08-01 DIAGNOSIS — R931 Abnormal findings on diagnostic imaging of heart and coronary circulation: Secondary | ICD-10-CM | POA: Diagnosis not present

## 2015-08-01 DIAGNOSIS — I10 Essential (primary) hypertension: Secondary | ICD-10-CM | POA: Diagnosis not present

## 2015-08-01 DIAGNOSIS — R0781 Pleurodynia: Secondary | ICD-10-CM | POA: Diagnosis not present

## 2015-08-01 DIAGNOSIS — R1012 Left upper quadrant pain: Secondary | ICD-10-CM | POA: Diagnosis not present

## 2015-08-01 DIAGNOSIS — R0789 Other chest pain: Secondary | ICD-10-CM | POA: Diagnosis not present

## 2015-08-01 DIAGNOSIS — J189 Pneumonia, unspecified organism: Secondary | ICD-10-CM | POA: Diagnosis not present

## 2015-08-01 DIAGNOSIS — J9811 Atelectasis: Secondary | ICD-10-CM | POA: Diagnosis not present

## 2015-08-01 DIAGNOSIS — I2699 Other pulmonary embolism without acute cor pulmonale: Secondary | ICD-10-CM | POA: Diagnosis present

## 2015-08-01 DIAGNOSIS — Z9081 Acquired absence of spleen: Secondary | ICD-10-CM | POA: Diagnosis not present

## 2015-08-01 DIAGNOSIS — Z7984 Long term (current) use of oral hypoglycemic drugs: Secondary | ICD-10-CM | POA: Diagnosis not present

## 2015-08-01 DIAGNOSIS — F17210 Nicotine dependence, cigarettes, uncomplicated: Secondary | ICD-10-CM | POA: Diagnosis present

## 2015-08-01 DIAGNOSIS — K862 Cyst of pancreas: Secondary | ICD-10-CM | POA: Diagnosis not present

## 2015-08-01 DIAGNOSIS — Z90411 Acquired partial absence of pancreas: Secondary | ICD-10-CM | POA: Diagnosis not present

## 2015-08-01 DIAGNOSIS — R0602 Shortness of breath: Secondary | ICD-10-CM | POA: Diagnosis not present

## 2015-08-01 DIAGNOSIS — R110 Nausea: Secondary | ICD-10-CM | POA: Diagnosis not present

## 2015-08-01 DIAGNOSIS — Z91041 Radiographic dye allergy status: Secondary | ICD-10-CM | POA: Diagnosis not present

## 2015-08-01 DIAGNOSIS — R938 Abnormal findings on diagnostic imaging of other specified body structures: Secondary | ICD-10-CM | POA: Diagnosis not present

## 2015-08-01 DIAGNOSIS — A419 Sepsis, unspecified organism: Secondary | ICD-10-CM | POA: Diagnosis not present

## 2015-08-01 DIAGNOSIS — K863 Pseudocyst of pancreas: Secondary | ICD-10-CM | POA: Diagnosis present

## 2015-08-10 DIAGNOSIS — K219 Gastro-esophageal reflux disease without esophagitis: Secondary | ICD-10-CM | POA: Diagnosis not present

## 2015-08-10 DIAGNOSIS — R1013 Epigastric pain: Secondary | ICD-10-CM | POA: Diagnosis not present

## 2015-08-10 DIAGNOSIS — I10 Essential (primary) hypertension: Secondary | ICD-10-CM | POA: Diagnosis not present

## 2015-08-10 DIAGNOSIS — R634 Abnormal weight loss: Secondary | ICD-10-CM | POA: Diagnosis not present

## 2015-08-10 DIAGNOSIS — E1140 Type 2 diabetes mellitus with diabetic neuropathy, unspecified: Secondary | ICD-10-CM | POA: Diagnosis not present

## 2015-08-10 DIAGNOSIS — E039 Hypothyroidism, unspecified: Secondary | ICD-10-CM | POA: Diagnosis not present

## 2015-08-10 DIAGNOSIS — K869 Disease of pancreas, unspecified: Secondary | ICD-10-CM | POA: Diagnosis not present

## 2015-08-10 DIAGNOSIS — Z6828 Body mass index (BMI) 28.0-28.9, adult: Secondary | ICD-10-CM | POA: Diagnosis not present

## 2015-08-10 DIAGNOSIS — E1165 Type 2 diabetes mellitus with hyperglycemia: Secondary | ICD-10-CM | POA: Diagnosis not present

## 2015-08-10 DIAGNOSIS — B359 Dermatophytosis, unspecified: Secondary | ICD-10-CM | POA: Diagnosis not present

## 2015-08-13 DIAGNOSIS — I252 Old myocardial infarction: Secondary | ICD-10-CM | POA: Diagnosis not present

## 2015-08-13 DIAGNOSIS — Z86711 Personal history of pulmonary embolism: Secondary | ICD-10-CM | POA: Diagnosis not present

## 2015-08-13 DIAGNOSIS — Z86010 Personal history of colonic polyps: Secondary | ICD-10-CM | POA: Diagnosis not present

## 2015-08-13 DIAGNOSIS — K8689 Other specified diseases of pancreas: Secondary | ICD-10-CM | POA: Diagnosis not present

## 2015-08-13 DIAGNOSIS — K219 Gastro-esophageal reflux disease without esophagitis: Secondary | ICD-10-CM | POA: Diagnosis not present

## 2015-08-13 DIAGNOSIS — Z9081 Acquired absence of spleen: Secondary | ICD-10-CM | POA: Diagnosis not present

## 2015-08-13 DIAGNOSIS — G8929 Other chronic pain: Secondary | ICD-10-CM | POA: Diagnosis not present

## 2015-08-13 DIAGNOSIS — Z885 Allergy status to narcotic agent status: Secondary | ICD-10-CM | POA: Diagnosis not present

## 2015-08-13 DIAGNOSIS — I10 Essential (primary) hypertension: Secondary | ICD-10-CM | POA: Diagnosis not present

## 2015-08-13 DIAGNOSIS — R1012 Left upper quadrant pain: Secondary | ICD-10-CM | POA: Diagnosis not present

## 2015-08-13 DIAGNOSIS — E079 Disorder of thyroid, unspecified: Secondary | ICD-10-CM | POA: Diagnosis not present

## 2015-08-13 DIAGNOSIS — K862 Cyst of pancreas: Secondary | ICD-10-CM | POA: Diagnosis not present

## 2015-08-13 DIAGNOSIS — E119 Type 2 diabetes mellitus without complications: Secondary | ICD-10-CM | POA: Diagnosis not present

## 2015-08-13 DIAGNOSIS — F17210 Nicotine dependence, cigarettes, uncomplicated: Secondary | ICD-10-CM | POA: Diagnosis not present

## 2015-08-13 DIAGNOSIS — K863 Pseudocyst of pancreas: Secondary | ICD-10-CM | POA: Diagnosis not present

## 2015-08-13 DIAGNOSIS — M549 Dorsalgia, unspecified: Secondary | ICD-10-CM | POA: Diagnosis not present

## 2015-08-13 DIAGNOSIS — Z91041 Radiographic dye allergy status: Secondary | ICD-10-CM | POA: Diagnosis not present

## 2015-08-16 DIAGNOSIS — R1084 Generalized abdominal pain: Secondary | ICD-10-CM | POA: Diagnosis not present

## 2015-08-20 DIAGNOSIS — F17200 Nicotine dependence, unspecified, uncomplicated: Secondary | ICD-10-CM | POA: Diagnosis not present

## 2015-08-20 DIAGNOSIS — Z90411 Acquired partial absence of pancreas: Secondary | ICD-10-CM | POA: Diagnosis not present

## 2015-08-20 DIAGNOSIS — F17210 Nicotine dependence, cigarettes, uncomplicated: Secondary | ICD-10-CM | POA: Diagnosis not present

## 2015-08-20 DIAGNOSIS — Z7984 Long term (current) use of oral hypoglycemic drugs: Secondary | ICD-10-CM | POA: Diagnosis not present

## 2015-08-20 DIAGNOSIS — E119 Type 2 diabetes mellitus without complications: Secondary | ICD-10-CM | POA: Diagnosis not present

## 2015-08-20 DIAGNOSIS — Z9081 Acquired absence of spleen: Secondary | ICD-10-CM | POA: Diagnosis not present

## 2015-08-20 DIAGNOSIS — K863 Pseudocyst of pancreas: Secondary | ICD-10-CM | POA: Diagnosis not present

## 2015-08-20 DIAGNOSIS — I10 Essential (primary) hypertension: Secondary | ICD-10-CM | POA: Diagnosis not present

## 2015-08-20 DIAGNOSIS — K8681 Exocrine pancreatic insufficiency: Secondary | ICD-10-CM | POA: Diagnosis not present

## 2015-08-20 DIAGNOSIS — K8689 Other specified diseases of pancreas: Secondary | ICD-10-CM | POA: Diagnosis not present

## 2015-08-20 DIAGNOSIS — Z7901 Long term (current) use of anticoagulants: Secondary | ICD-10-CM | POA: Diagnosis not present

## 2015-08-20 DIAGNOSIS — Z86711 Personal history of pulmonary embolism: Secondary | ICD-10-CM | POA: Diagnosis not present

## 2015-08-20 DIAGNOSIS — I2699 Other pulmonary embolism without acute cor pulmonale: Secondary | ICD-10-CM | POA: Diagnosis not present

## 2015-08-20 DIAGNOSIS — I252 Old myocardial infarction: Secondary | ICD-10-CM | POA: Diagnosis not present

## 2015-08-20 DIAGNOSIS — Z09 Encounter for follow-up examination after completed treatment for conditions other than malignant neoplasm: Secondary | ICD-10-CM | POA: Diagnosis not present

## 2015-08-21 DIAGNOSIS — R1013 Epigastric pain: Secondary | ICD-10-CM | POA: Diagnosis not present

## 2015-08-21 DIAGNOSIS — R1084 Generalized abdominal pain: Secondary | ICD-10-CM | POA: Diagnosis not present

## 2015-08-21 DIAGNOSIS — R1012 Left upper quadrant pain: Secondary | ICD-10-CM | POA: Diagnosis not present

## 2015-08-21 DIAGNOSIS — R0602 Shortness of breath: Secondary | ICD-10-CM | POA: Diagnosis not present

## 2015-08-21 DIAGNOSIS — K863 Pseudocyst of pancreas: Secondary | ICD-10-CM | POA: Diagnosis not present

## 2015-08-24 DIAGNOSIS — R079 Chest pain, unspecified: Secondary | ICD-10-CM | POA: Diagnosis not present

## 2015-08-24 DIAGNOSIS — I10 Essential (primary) hypertension: Secondary | ICD-10-CM | POA: Diagnosis not present

## 2015-08-24 DIAGNOSIS — K219 Gastro-esophageal reflux disease without esophagitis: Secondary | ICD-10-CM | POA: Diagnosis not present

## 2015-08-24 DIAGNOSIS — E039 Hypothyroidism, unspecified: Secondary | ICD-10-CM | POA: Diagnosis not present

## 2015-08-24 DIAGNOSIS — R1013 Epigastric pain: Secondary | ICD-10-CM | POA: Diagnosis not present

## 2015-08-24 DIAGNOSIS — R634 Abnormal weight loss: Secondary | ICD-10-CM | POA: Diagnosis not present

## 2015-08-24 DIAGNOSIS — Z6826 Body mass index (BMI) 26.0-26.9, adult: Secondary | ICD-10-CM | POA: Diagnosis not present

## 2015-08-24 DIAGNOSIS — E1165 Type 2 diabetes mellitus with hyperglycemia: Secondary | ICD-10-CM | POA: Diagnosis not present

## 2015-08-24 DIAGNOSIS — B359 Dermatophytosis, unspecified: Secondary | ICD-10-CM | POA: Diagnosis not present

## 2015-08-24 DIAGNOSIS — E663 Overweight: Secondary | ICD-10-CM | POA: Diagnosis not present

## 2015-08-24 DIAGNOSIS — E1140 Type 2 diabetes mellitus with diabetic neuropathy, unspecified: Secondary | ICD-10-CM | POA: Diagnosis not present

## 2015-08-24 DIAGNOSIS — K869 Disease of pancreas, unspecified: Secondary | ICD-10-CM | POA: Diagnosis not present

## 2015-08-27 DIAGNOSIS — K862 Cyst of pancreas: Secondary | ICD-10-CM | POA: Diagnosis not present

## 2015-09-04 DIAGNOSIS — E079 Disorder of thyroid, unspecified: Secondary | ICD-10-CM | POA: Diagnosis not present

## 2015-09-04 DIAGNOSIS — Z888 Allergy status to other drugs, medicaments and biological substances status: Secondary | ICD-10-CM | POA: Diagnosis not present

## 2015-09-04 DIAGNOSIS — Z886 Allergy status to analgesic agent status: Secondary | ICD-10-CM | POA: Diagnosis not present

## 2015-09-04 DIAGNOSIS — K863 Pseudocyst of pancreas: Secondary | ICD-10-CM | POA: Diagnosis not present

## 2015-09-04 DIAGNOSIS — Z79899 Other long term (current) drug therapy: Secondary | ICD-10-CM | POA: Diagnosis not present

## 2015-09-04 DIAGNOSIS — K8591 Acute pancreatitis with uninfected necrosis, unspecified: Secondary | ICD-10-CM | POA: Diagnosis not present

## 2015-09-04 DIAGNOSIS — I252 Old myocardial infarction: Secondary | ICD-10-CM | POA: Diagnosis not present

## 2015-09-04 DIAGNOSIS — Z91041 Radiographic dye allergy status: Secondary | ICD-10-CM | POA: Diagnosis not present

## 2015-09-04 DIAGNOSIS — Z7901 Long term (current) use of anticoagulants: Secondary | ICD-10-CM | POA: Diagnosis not present

## 2015-09-04 DIAGNOSIS — F17210 Nicotine dependence, cigarettes, uncomplicated: Secondary | ICD-10-CM | POA: Diagnosis not present

## 2015-09-04 DIAGNOSIS — Z7951 Long term (current) use of inhaled steroids: Secondary | ICD-10-CM | POA: Diagnosis not present

## 2015-09-04 DIAGNOSIS — Z7984 Long term (current) use of oral hypoglycemic drugs: Secondary | ICD-10-CM | POA: Diagnosis not present

## 2015-09-04 DIAGNOSIS — I10 Essential (primary) hypertension: Secondary | ICD-10-CM | POA: Diagnosis not present

## 2015-09-04 DIAGNOSIS — Z86010 Personal history of colonic polyps: Secondary | ICD-10-CM | POA: Diagnosis not present

## 2015-09-04 DIAGNOSIS — E119 Type 2 diabetes mellitus without complications: Secondary | ICD-10-CM | POA: Diagnosis not present

## 2015-09-04 DIAGNOSIS — Z86711 Personal history of pulmonary embolism: Secondary | ICD-10-CM | POA: Diagnosis not present

## 2015-09-04 DIAGNOSIS — E039 Hypothyroidism, unspecified: Secondary | ICD-10-CM | POA: Diagnosis not present

## 2015-09-04 DIAGNOSIS — K219 Gastro-esophageal reflux disease without esophagitis: Secondary | ICD-10-CM | POA: Diagnosis not present

## 2015-09-08 DIAGNOSIS — Z79899 Other long term (current) drug therapy: Secondary | ICD-10-CM | POA: Diagnosis not present

## 2015-09-08 DIAGNOSIS — Z5181 Encounter for therapeutic drug level monitoring: Secondary | ICD-10-CM | POA: Diagnosis not present

## 2015-09-08 DIAGNOSIS — M47817 Spondylosis without myelopathy or radiculopathy, lumbosacral region: Secondary | ICD-10-CM | POA: Diagnosis not present

## 2015-09-08 DIAGNOSIS — G8929 Other chronic pain: Secondary | ICD-10-CM | POA: Diagnosis not present

## 2015-09-08 DIAGNOSIS — G894 Chronic pain syndrome: Secondary | ICD-10-CM | POA: Diagnosis not present

## 2015-09-08 DIAGNOSIS — R109 Unspecified abdominal pain: Secondary | ICD-10-CM | POA: Diagnosis not present

## 2015-09-10 DIAGNOSIS — I2699 Other pulmonary embolism without acute cor pulmonale: Secondary | ICD-10-CM | POA: Diagnosis not present

## 2015-09-10 DIAGNOSIS — I2510 Atherosclerotic heart disease of native coronary artery without angina pectoris: Secondary | ICD-10-CM | POA: Diagnosis not present

## 2015-09-10 DIAGNOSIS — R079 Chest pain, unspecified: Secondary | ICD-10-CM | POA: Diagnosis not present

## 2015-09-10 DIAGNOSIS — Z7901 Long term (current) use of anticoagulants: Secondary | ICD-10-CM | POA: Diagnosis not present

## 2015-09-15 DIAGNOSIS — I2510 Atherosclerotic heart disease of native coronary artery without angina pectoris: Secondary | ICD-10-CM | POA: Diagnosis not present

## 2015-09-15 DIAGNOSIS — M5136 Other intervertebral disc degeneration, lumbar region: Secondary | ICD-10-CM | POA: Diagnosis not present

## 2015-09-15 DIAGNOSIS — M5137 Other intervertebral disc degeneration, lumbosacral region: Secondary | ICD-10-CM | POA: Diagnosis not present

## 2015-09-15 DIAGNOSIS — R079 Chest pain, unspecified: Secondary | ICD-10-CM | POA: Diagnosis not present

## 2015-09-16 DIAGNOSIS — I252 Old myocardial infarction: Secondary | ICD-10-CM | POA: Diagnosis not present

## 2015-09-16 DIAGNOSIS — R079 Chest pain, unspecified: Secondary | ICD-10-CM | POA: Diagnosis not present

## 2015-09-16 DIAGNOSIS — J9801 Acute bronchospasm: Secondary | ICD-10-CM | POA: Diagnosis not present

## 2015-09-16 DIAGNOSIS — I2510 Atherosclerotic heart disease of native coronary artery without angina pectoris: Secondary | ICD-10-CM | POA: Diagnosis not present

## 2015-09-18 DIAGNOSIS — Z91041 Radiographic dye allergy status: Secondary | ICD-10-CM | POA: Diagnosis not present

## 2015-09-18 DIAGNOSIS — I10 Essential (primary) hypertension: Secondary | ICD-10-CM | POA: Diagnosis not present

## 2015-09-18 DIAGNOSIS — Z7984 Long term (current) use of oral hypoglycemic drugs: Secondary | ICD-10-CM | POA: Diagnosis not present

## 2015-09-18 DIAGNOSIS — K8689 Other specified diseases of pancreas: Secondary | ICD-10-CM | POA: Diagnosis not present

## 2015-09-18 DIAGNOSIS — Z86711 Personal history of pulmonary embolism: Secondary | ICD-10-CM | POA: Diagnosis not present

## 2015-09-18 DIAGNOSIS — K863 Pseudocyst of pancreas: Secondary | ICD-10-CM | POA: Diagnosis not present

## 2015-09-18 DIAGNOSIS — E079 Disorder of thyroid, unspecified: Secondary | ICD-10-CM | POA: Diagnosis not present

## 2015-09-18 DIAGNOSIS — Z886 Allergy status to analgesic agent status: Secondary | ICD-10-CM | POA: Diagnosis not present

## 2015-09-18 DIAGNOSIS — I252 Old myocardial infarction: Secondary | ICD-10-CM | POA: Diagnosis not present

## 2015-09-18 DIAGNOSIS — E119 Type 2 diabetes mellitus without complications: Secondary | ICD-10-CM | POA: Diagnosis not present

## 2015-09-18 DIAGNOSIS — K8591 Acute pancreatitis with uninfected necrosis, unspecified: Secondary | ICD-10-CM | POA: Diagnosis not present

## 2015-09-18 DIAGNOSIS — K219 Gastro-esophageal reflux disease without esophagitis: Secondary | ICD-10-CM | POA: Diagnosis not present

## 2015-09-18 DIAGNOSIS — Z86010 Personal history of colonic polyps: Secondary | ICD-10-CM | POA: Diagnosis not present

## 2015-09-18 DIAGNOSIS — Z888 Allergy status to other drugs, medicaments and biological substances status: Secondary | ICD-10-CM | POA: Diagnosis not present

## 2015-09-18 DIAGNOSIS — Z7901 Long term (current) use of anticoagulants: Secondary | ICD-10-CM | POA: Diagnosis not present

## 2015-09-18 DIAGNOSIS — F17210 Nicotine dependence, cigarettes, uncomplicated: Secondary | ICD-10-CM | POA: Diagnosis not present

## 2015-09-21 DIAGNOSIS — E1165 Type 2 diabetes mellitus with hyperglycemia: Secondary | ICD-10-CM | POA: Diagnosis not present

## 2015-09-21 DIAGNOSIS — I10 Essential (primary) hypertension: Secondary | ICD-10-CM | POA: Diagnosis not present

## 2015-09-21 DIAGNOSIS — E039 Hypothyroidism, unspecified: Secondary | ICD-10-CM | POA: Diagnosis not present

## 2015-09-21 DIAGNOSIS — E1140 Type 2 diabetes mellitus with diabetic neuropathy, unspecified: Secondary | ICD-10-CM | POA: Diagnosis not present

## 2015-09-21 DIAGNOSIS — B359 Dermatophytosis, unspecified: Secondary | ICD-10-CM | POA: Diagnosis not present

## 2015-09-21 DIAGNOSIS — R634 Abnormal weight loss: Secondary | ICD-10-CM | POA: Diagnosis not present

## 2015-09-21 DIAGNOSIS — R1013 Epigastric pain: Secondary | ICD-10-CM | POA: Diagnosis not present

## 2015-09-21 DIAGNOSIS — K219 Gastro-esophageal reflux disease without esophagitis: Secondary | ICD-10-CM | POA: Diagnosis not present

## 2015-09-21 DIAGNOSIS — K863 Pseudocyst of pancreas: Secondary | ICD-10-CM | POA: Diagnosis not present

## 2015-09-25 DIAGNOSIS — Z90411 Acquired partial absence of pancreas: Secondary | ICD-10-CM | POA: Diagnosis not present

## 2015-09-25 DIAGNOSIS — Z4689 Encounter for fitting and adjustment of other specified devices: Secondary | ICD-10-CM | POA: Diagnosis not present

## 2015-09-25 DIAGNOSIS — Z09 Encounter for follow-up examination after completed treatment for conditions other than malignant neoplasm: Secondary | ICD-10-CM | POA: Diagnosis not present

## 2015-09-25 DIAGNOSIS — K219 Gastro-esophageal reflux disease without esophagitis: Secondary | ICD-10-CM | POA: Diagnosis not present

## 2015-09-25 DIAGNOSIS — K8689 Other specified diseases of pancreas: Secondary | ICD-10-CM | POA: Diagnosis not present

## 2015-09-25 DIAGNOSIS — I10 Essential (primary) hypertension: Secondary | ICD-10-CM | POA: Diagnosis not present

## 2015-09-25 DIAGNOSIS — Z86010 Personal history of colonic polyps: Secondary | ICD-10-CM | POA: Diagnosis not present

## 2015-09-25 DIAGNOSIS — F17200 Nicotine dependence, unspecified, uncomplicated: Secondary | ICD-10-CM | POA: Diagnosis not present

## 2015-09-25 DIAGNOSIS — E119 Type 2 diabetes mellitus without complications: Secondary | ICD-10-CM | POA: Diagnosis not present

## 2015-09-28 DIAGNOSIS — K862 Cyst of pancreas: Secondary | ICD-10-CM | POA: Diagnosis not present

## 2015-10-05 DIAGNOSIS — I252 Old myocardial infarction: Secondary | ICD-10-CM | POA: Diagnosis not present

## 2015-10-05 DIAGNOSIS — K8591 Acute pancreatitis with uninfected necrosis, unspecified: Secondary | ICD-10-CM | POA: Diagnosis not present

## 2015-10-05 DIAGNOSIS — K219 Gastro-esophageal reflux disease without esophagitis: Secondary | ICD-10-CM | POA: Diagnosis not present

## 2015-10-05 DIAGNOSIS — I10 Essential (primary) hypertension: Secondary | ICD-10-CM | POA: Diagnosis not present

## 2015-10-08 DIAGNOSIS — G8929 Other chronic pain: Secondary | ICD-10-CM | POA: Diagnosis not present

## 2015-10-08 DIAGNOSIS — Z79899 Other long term (current) drug therapy: Secondary | ICD-10-CM | POA: Diagnosis not present

## 2015-10-08 DIAGNOSIS — K863 Pseudocyst of pancreas: Secondary | ICD-10-CM | POA: Diagnosis not present

## 2015-10-08 DIAGNOSIS — Z91041 Radiographic dye allergy status: Secondary | ICD-10-CM | POA: Diagnosis not present

## 2015-10-08 DIAGNOSIS — Z86010 Personal history of colonic polyps: Secondary | ICD-10-CM | POA: Diagnosis not present

## 2015-10-08 DIAGNOSIS — Z9081 Acquired absence of spleen: Secondary | ICD-10-CM | POA: Diagnosis not present

## 2015-10-08 DIAGNOSIS — K219 Gastro-esophageal reflux disease without esophagitis: Secondary | ICD-10-CM | POA: Diagnosis not present

## 2015-10-08 DIAGNOSIS — M549 Dorsalgia, unspecified: Secondary | ICD-10-CM | POA: Diagnosis not present

## 2015-10-08 DIAGNOSIS — I252 Old myocardial infarction: Secondary | ICD-10-CM | POA: Diagnosis not present

## 2015-10-08 DIAGNOSIS — F17200 Nicotine dependence, unspecified, uncomplicated: Secondary | ICD-10-CM | POA: Diagnosis not present

## 2015-10-08 DIAGNOSIS — I10 Essential (primary) hypertension: Secondary | ICD-10-CM | POA: Diagnosis not present

## 2015-10-08 DIAGNOSIS — E039 Hypothyroidism, unspecified: Secondary | ICD-10-CM | POA: Diagnosis not present

## 2015-10-08 DIAGNOSIS — I2510 Atherosclerotic heart disease of native coronary artery without angina pectoris: Secondary | ICD-10-CM | POA: Diagnosis not present

## 2015-10-08 DIAGNOSIS — F40240 Claustrophobia: Secondary | ICD-10-CM | POA: Diagnosis not present

## 2015-10-08 DIAGNOSIS — Z7901 Long term (current) use of anticoagulants: Secondary | ICD-10-CM | POA: Diagnosis not present

## 2015-10-08 DIAGNOSIS — Z7984 Long term (current) use of oral hypoglycemic drugs: Secondary | ICD-10-CM | POA: Diagnosis not present

## 2015-10-08 DIAGNOSIS — K8689 Other specified diseases of pancreas: Secondary | ICD-10-CM | POA: Diagnosis not present

## 2015-10-08 DIAGNOSIS — Z885 Allergy status to narcotic agent status: Secondary | ICD-10-CM | POA: Diagnosis not present

## 2015-10-08 DIAGNOSIS — Z888 Allergy status to other drugs, medicaments and biological substances status: Secondary | ICD-10-CM | POA: Diagnosis not present

## 2015-10-08 DIAGNOSIS — Z86711 Personal history of pulmonary embolism: Secondary | ICD-10-CM | POA: Diagnosis not present

## 2015-10-08 DIAGNOSIS — E119 Type 2 diabetes mellitus without complications: Secondary | ICD-10-CM | POA: Diagnosis not present

## 2015-10-08 DIAGNOSIS — K3189 Other diseases of stomach and duodenum: Secondary | ICD-10-CM | POA: Diagnosis not present

## 2015-10-21 DIAGNOSIS — K219 Gastro-esophageal reflux disease without esophagitis: Secondary | ICD-10-CM | POA: Diagnosis not present

## 2015-10-21 DIAGNOSIS — I10 Essential (primary) hypertension: Secondary | ICD-10-CM | POA: Diagnosis not present

## 2015-10-21 DIAGNOSIS — E039 Hypothyroidism, unspecified: Secondary | ICD-10-CM | POA: Diagnosis not present

## 2015-10-21 DIAGNOSIS — R634 Abnormal weight loss: Secondary | ICD-10-CM | POA: Diagnosis not present

## 2015-10-21 DIAGNOSIS — I2699 Other pulmonary embolism without acute cor pulmonale: Secondary | ICD-10-CM | POA: Diagnosis not present

## 2015-10-21 DIAGNOSIS — K863 Pseudocyst of pancreas: Secondary | ICD-10-CM | POA: Diagnosis not present

## 2015-10-21 DIAGNOSIS — R1013 Epigastric pain: Secondary | ICD-10-CM | POA: Diagnosis not present

## 2015-10-21 DIAGNOSIS — E1140 Type 2 diabetes mellitus with diabetic neuropathy, unspecified: Secondary | ICD-10-CM | POA: Diagnosis not present

## 2015-10-21 DIAGNOSIS — E1165 Type 2 diabetes mellitus with hyperglycemia: Secondary | ICD-10-CM | POA: Diagnosis not present

## 2015-11-18 DIAGNOSIS — K863 Pseudocyst of pancreas: Secondary | ICD-10-CM | POA: Diagnosis not present

## 2015-11-18 DIAGNOSIS — R1013 Epigastric pain: Secondary | ICD-10-CM | POA: Diagnosis not present

## 2015-11-18 DIAGNOSIS — E039 Hypothyroidism, unspecified: Secondary | ICD-10-CM | POA: Diagnosis not present

## 2015-11-18 DIAGNOSIS — Z6825 Body mass index (BMI) 25.0-25.9, adult: Secondary | ICD-10-CM | POA: Diagnosis not present

## 2015-11-18 DIAGNOSIS — I2699 Other pulmonary embolism without acute cor pulmonale: Secondary | ICD-10-CM | POA: Diagnosis not present

## 2015-11-18 DIAGNOSIS — I10 Essential (primary) hypertension: Secondary | ICD-10-CM | POA: Diagnosis not present

## 2015-11-18 DIAGNOSIS — E663 Overweight: Secondary | ICD-10-CM | POA: Diagnosis not present

## 2015-11-18 DIAGNOSIS — K219 Gastro-esophageal reflux disease without esophagitis: Secondary | ICD-10-CM | POA: Diagnosis not present

## 2015-11-18 DIAGNOSIS — E1165 Type 2 diabetes mellitus with hyperglycemia: Secondary | ICD-10-CM | POA: Diagnosis not present

## 2015-11-18 DIAGNOSIS — R634 Abnormal weight loss: Secondary | ICD-10-CM | POA: Diagnosis not present

## 2015-11-18 DIAGNOSIS — E1140 Type 2 diabetes mellitus with diabetic neuropathy, unspecified: Secondary | ICD-10-CM | POA: Diagnosis not present

## 2015-11-20 DIAGNOSIS — Z7901 Long term (current) use of anticoagulants: Secondary | ICD-10-CM | POA: Diagnosis not present

## 2015-11-20 DIAGNOSIS — Z885 Allergy status to narcotic agent status: Secondary | ICD-10-CM | POA: Diagnosis not present

## 2015-11-20 DIAGNOSIS — F17200 Nicotine dependence, unspecified, uncomplicated: Secondary | ICD-10-CM | POA: Diagnosis not present

## 2015-11-20 DIAGNOSIS — G8929 Other chronic pain: Secondary | ICD-10-CM | POA: Diagnosis not present

## 2015-11-20 DIAGNOSIS — Z86711 Personal history of pulmonary embolism: Secondary | ICD-10-CM | POA: Diagnosis not present

## 2015-11-20 DIAGNOSIS — E119 Type 2 diabetes mellitus without complications: Secondary | ICD-10-CM | POA: Diagnosis not present

## 2015-11-20 DIAGNOSIS — Z91041 Radiographic dye allergy status: Secondary | ICD-10-CM | POA: Diagnosis not present

## 2015-11-20 DIAGNOSIS — Z888 Allergy status to other drugs, medicaments and biological substances status: Secondary | ICD-10-CM | POA: Diagnosis not present

## 2015-11-20 DIAGNOSIS — R109 Unspecified abdominal pain: Secondary | ICD-10-CM | POA: Diagnosis not present

## 2015-11-26 DIAGNOSIS — K8689 Other specified diseases of pancreas: Secondary | ICD-10-CM | POA: Diagnosis not present

## 2015-11-26 DIAGNOSIS — R1012 Left upper quadrant pain: Secondary | ICD-10-CM | POA: Diagnosis not present

## 2015-12-04 DIAGNOSIS — F17200 Nicotine dependence, unspecified, uncomplicated: Secondary | ICD-10-CM | POA: Diagnosis not present

## 2015-12-04 DIAGNOSIS — I10 Essential (primary) hypertension: Secondary | ICD-10-CM | POA: Diagnosis not present

## 2015-12-04 DIAGNOSIS — Z885 Allergy status to narcotic agent status: Secondary | ICD-10-CM | POA: Diagnosis not present

## 2015-12-04 DIAGNOSIS — Z86711 Personal history of pulmonary embolism: Secondary | ICD-10-CM | POA: Diagnosis not present

## 2015-12-04 DIAGNOSIS — G8929 Other chronic pain: Secondary | ICD-10-CM | POA: Diagnosis not present

## 2015-12-04 DIAGNOSIS — M545 Low back pain: Secondary | ICD-10-CM | POA: Diagnosis not present

## 2015-12-04 DIAGNOSIS — Z7901 Long term (current) use of anticoagulants: Secondary | ICD-10-CM | POA: Diagnosis not present

## 2015-12-04 DIAGNOSIS — E119 Type 2 diabetes mellitus without complications: Secondary | ICD-10-CM | POA: Diagnosis not present

## 2015-12-04 DIAGNOSIS — M47817 Spondylosis without myelopathy or radiculopathy, lumbosacral region: Secondary | ICD-10-CM | POA: Diagnosis not present

## 2015-12-04 DIAGNOSIS — Z888 Allergy status to other drugs, medicaments and biological substances status: Secondary | ICD-10-CM | POA: Diagnosis not present

## 2015-12-16 DIAGNOSIS — I10 Essential (primary) hypertension: Secondary | ICD-10-CM | POA: Diagnosis not present

## 2015-12-16 DIAGNOSIS — Z6826 Body mass index (BMI) 26.0-26.9, adult: Secondary | ICD-10-CM | POA: Diagnosis not present

## 2015-12-16 DIAGNOSIS — K219 Gastro-esophageal reflux disease without esophagitis: Secondary | ICD-10-CM | POA: Diagnosis not present

## 2015-12-16 DIAGNOSIS — E1165 Type 2 diabetes mellitus with hyperglycemia: Secondary | ICD-10-CM | POA: Diagnosis not present

## 2015-12-16 DIAGNOSIS — R1013 Epigastric pain: Secondary | ICD-10-CM | POA: Diagnosis not present

## 2015-12-16 DIAGNOSIS — E039 Hypothyroidism, unspecified: Secondary | ICD-10-CM | POA: Diagnosis not present

## 2015-12-16 DIAGNOSIS — E663 Overweight: Secondary | ICD-10-CM | POA: Diagnosis not present

## 2015-12-16 DIAGNOSIS — E1140 Type 2 diabetes mellitus with diabetic neuropathy, unspecified: Secondary | ICD-10-CM | POA: Diagnosis not present

## 2015-12-16 DIAGNOSIS — I2699 Other pulmonary embolism without acute cor pulmonale: Secondary | ICD-10-CM | POA: Diagnosis not present

## 2015-12-16 DIAGNOSIS — R634 Abnormal weight loss: Secondary | ICD-10-CM | POA: Diagnosis not present

## 2015-12-16 DIAGNOSIS — K863 Pseudocyst of pancreas: Secondary | ICD-10-CM | POA: Diagnosis not present

## 2015-12-18 DIAGNOSIS — F17200 Nicotine dependence, unspecified, uncomplicated: Secondary | ICD-10-CM | POA: Diagnosis not present

## 2015-12-18 DIAGNOSIS — Z888 Allergy status to other drugs, medicaments and biological substances status: Secondary | ICD-10-CM | POA: Diagnosis not present

## 2015-12-18 DIAGNOSIS — M545 Low back pain: Secondary | ICD-10-CM | POA: Diagnosis not present

## 2015-12-18 DIAGNOSIS — E119 Type 2 diabetes mellitus without complications: Secondary | ICD-10-CM | POA: Diagnosis not present

## 2015-12-18 DIAGNOSIS — Z91041 Radiographic dye allergy status: Secondary | ICD-10-CM | POA: Diagnosis not present

## 2015-12-18 DIAGNOSIS — M47817 Spondylosis without myelopathy or radiculopathy, lumbosacral region: Secondary | ICD-10-CM | POA: Diagnosis not present

## 2015-12-18 DIAGNOSIS — Z885 Allergy status to narcotic agent status: Secondary | ICD-10-CM | POA: Diagnosis not present

## 2015-12-18 DIAGNOSIS — Z7901 Long term (current) use of anticoagulants: Secondary | ICD-10-CM | POA: Diagnosis not present

## 2015-12-18 DIAGNOSIS — G8929 Other chronic pain: Secondary | ICD-10-CM | POA: Diagnosis not present

## 2015-12-18 DIAGNOSIS — I10 Essential (primary) hypertension: Secondary | ICD-10-CM | POA: Diagnosis not present

## 2015-12-18 DIAGNOSIS — Z86711 Personal history of pulmonary embolism: Secondary | ICD-10-CM | POA: Diagnosis not present

## 2015-12-31 DIAGNOSIS — M47817 Spondylosis without myelopathy or radiculopathy, lumbosacral region: Secondary | ICD-10-CM | POA: Diagnosis not present

## 2015-12-31 DIAGNOSIS — M545 Low back pain: Secondary | ICD-10-CM | POA: Diagnosis not present

## 2015-12-31 DIAGNOSIS — R109 Unspecified abdominal pain: Secondary | ICD-10-CM | POA: Diagnosis not present

## 2015-12-31 DIAGNOSIS — G548 Other nerve root and plexus disorders: Secondary | ICD-10-CM | POA: Diagnosis not present

## 2015-12-31 DIAGNOSIS — M47816 Spondylosis without myelopathy or radiculopathy, lumbar region: Secondary | ICD-10-CM | POA: Diagnosis not present

## 2016-01-13 DIAGNOSIS — R1013 Epigastric pain: Secondary | ICD-10-CM | POA: Diagnosis not present

## 2016-01-13 DIAGNOSIS — I2699 Other pulmonary embolism without acute cor pulmonale: Secondary | ICD-10-CM | POA: Diagnosis not present

## 2016-01-13 DIAGNOSIS — E039 Hypothyroidism, unspecified: Secondary | ICD-10-CM | POA: Diagnosis not present

## 2016-01-13 DIAGNOSIS — I10 Essential (primary) hypertension: Secondary | ICD-10-CM | POA: Diagnosis not present

## 2016-01-13 DIAGNOSIS — E1140 Type 2 diabetes mellitus with diabetic neuropathy, unspecified: Secondary | ICD-10-CM | POA: Diagnosis not present

## 2016-01-13 DIAGNOSIS — K863 Pseudocyst of pancreas: Secondary | ICD-10-CM | POA: Diagnosis not present

## 2016-01-13 DIAGNOSIS — Z1389 Encounter for screening for other disorder: Secondary | ICD-10-CM | POA: Diagnosis not present

## 2016-01-13 DIAGNOSIS — Z23 Encounter for immunization: Secondary | ICD-10-CM | POA: Diagnosis not present

## 2016-01-13 DIAGNOSIS — E663 Overweight: Secondary | ICD-10-CM | POA: Diagnosis not present

## 2016-01-13 DIAGNOSIS — E1165 Type 2 diabetes mellitus with hyperglycemia: Secondary | ICD-10-CM | POA: Diagnosis not present

## 2016-01-13 DIAGNOSIS — Z9181 History of falling: Secondary | ICD-10-CM | POA: Diagnosis not present

## 2016-01-13 DIAGNOSIS — K219 Gastro-esophageal reflux disease without esophagitis: Secondary | ICD-10-CM | POA: Diagnosis not present

## 2016-01-22 DIAGNOSIS — Z91048 Other nonmedicinal substance allergy status: Secondary | ICD-10-CM | POA: Diagnosis not present

## 2016-01-22 DIAGNOSIS — Z885 Allergy status to narcotic agent status: Secondary | ICD-10-CM | POA: Diagnosis not present

## 2016-01-22 DIAGNOSIS — I10 Essential (primary) hypertension: Secondary | ICD-10-CM | POA: Diagnosis not present

## 2016-01-22 DIAGNOSIS — E119 Type 2 diabetes mellitus without complications: Secondary | ICD-10-CM | POA: Diagnosis not present

## 2016-01-22 DIAGNOSIS — M47817 Spondylosis without myelopathy or radiculopathy, lumbosacral region: Secondary | ICD-10-CM | POA: Diagnosis not present

## 2016-01-22 DIAGNOSIS — Z86711 Personal history of pulmonary embolism: Secondary | ICD-10-CM | POA: Diagnosis not present

## 2016-01-22 DIAGNOSIS — G894 Chronic pain syndrome: Secondary | ICD-10-CM | POA: Diagnosis not present

## 2016-01-22 DIAGNOSIS — Z888 Allergy status to other drugs, medicaments and biological substances status: Secondary | ICD-10-CM | POA: Diagnosis not present

## 2016-01-22 DIAGNOSIS — M47816 Spondylosis without myelopathy or radiculopathy, lumbar region: Secondary | ICD-10-CM | POA: Diagnosis not present

## 2016-01-22 DIAGNOSIS — F17200 Nicotine dependence, unspecified, uncomplicated: Secondary | ICD-10-CM | POA: Diagnosis not present

## 2016-02-16 DIAGNOSIS — R634 Abnormal weight loss: Secondary | ICD-10-CM | POA: Diagnosis not present

## 2016-02-16 DIAGNOSIS — E1165 Type 2 diabetes mellitus with hyperglycemia: Secondary | ICD-10-CM | POA: Diagnosis not present

## 2016-02-16 DIAGNOSIS — I10 Essential (primary) hypertension: Secondary | ICD-10-CM | POA: Diagnosis not present

## 2016-02-16 DIAGNOSIS — E039 Hypothyroidism, unspecified: Secondary | ICD-10-CM | POA: Diagnosis not present

## 2016-02-16 DIAGNOSIS — R1013 Epigastric pain: Secondary | ICD-10-CM | POA: Diagnosis not present

## 2016-02-16 DIAGNOSIS — Z6827 Body mass index (BMI) 27.0-27.9, adult: Secondary | ICD-10-CM | POA: Diagnosis not present

## 2016-02-16 DIAGNOSIS — E663 Overweight: Secondary | ICD-10-CM | POA: Diagnosis not present

## 2016-02-16 DIAGNOSIS — E1140 Type 2 diabetes mellitus with diabetic neuropathy, unspecified: Secondary | ICD-10-CM | POA: Diagnosis not present

## 2016-02-16 DIAGNOSIS — I2699 Other pulmonary embolism without acute cor pulmonale: Secondary | ICD-10-CM | POA: Diagnosis not present

## 2016-02-16 DIAGNOSIS — K219 Gastro-esophageal reflux disease without esophagitis: Secondary | ICD-10-CM | POA: Diagnosis not present

## 2016-02-16 DIAGNOSIS — K863 Pseudocyst of pancreas: Secondary | ICD-10-CM | POA: Diagnosis not present

## 2016-03-02 DIAGNOSIS — E663 Overweight: Secondary | ICD-10-CM | POA: Diagnosis not present

## 2016-03-02 DIAGNOSIS — I2699 Other pulmonary embolism without acute cor pulmonale: Secondary | ICD-10-CM | POA: Diagnosis not present

## 2016-03-02 DIAGNOSIS — R634 Abnormal weight loss: Secondary | ICD-10-CM | POA: Diagnosis not present

## 2016-03-02 DIAGNOSIS — R42 Dizziness and giddiness: Secondary | ICD-10-CM | POA: Diagnosis not present

## 2016-03-02 DIAGNOSIS — K219 Gastro-esophageal reflux disease without esophagitis: Secondary | ICD-10-CM | POA: Diagnosis not present

## 2016-03-02 DIAGNOSIS — K863 Pseudocyst of pancreas: Secondary | ICD-10-CM | POA: Diagnosis not present

## 2016-03-02 DIAGNOSIS — R1013 Epigastric pain: Secondary | ICD-10-CM | POA: Diagnosis not present

## 2016-03-02 DIAGNOSIS — I10 Essential (primary) hypertension: Secondary | ICD-10-CM | POA: Diagnosis not present

## 2016-03-02 DIAGNOSIS — Z6827 Body mass index (BMI) 27.0-27.9, adult: Secondary | ICD-10-CM | POA: Diagnosis not present

## 2016-03-02 DIAGNOSIS — E1165 Type 2 diabetes mellitus with hyperglycemia: Secondary | ICD-10-CM | POA: Diagnosis not present

## 2016-03-02 DIAGNOSIS — E1140 Type 2 diabetes mellitus with diabetic neuropathy, unspecified: Secondary | ICD-10-CM | POA: Diagnosis not present

## 2016-03-02 DIAGNOSIS — E039 Hypothyroidism, unspecified: Secondary | ICD-10-CM | POA: Diagnosis not present

## 2016-03-15 DIAGNOSIS — E1140 Type 2 diabetes mellitus with diabetic neuropathy, unspecified: Secondary | ICD-10-CM | POA: Diagnosis not present

## 2016-03-15 DIAGNOSIS — I10 Essential (primary) hypertension: Secondary | ICD-10-CM | POA: Diagnosis not present

## 2016-03-15 DIAGNOSIS — Z6827 Body mass index (BMI) 27.0-27.9, adult: Secondary | ICD-10-CM | POA: Diagnosis not present

## 2016-03-15 DIAGNOSIS — I2699 Other pulmonary embolism without acute cor pulmonale: Secondary | ICD-10-CM | POA: Diagnosis not present

## 2016-03-15 DIAGNOSIS — E663 Overweight: Secondary | ICD-10-CM | POA: Diagnosis not present

## 2016-03-15 DIAGNOSIS — E039 Hypothyroidism, unspecified: Secondary | ICD-10-CM | POA: Diagnosis not present

## 2016-03-15 DIAGNOSIS — R634 Abnormal weight loss: Secondary | ICD-10-CM | POA: Diagnosis not present

## 2016-03-15 DIAGNOSIS — E1165 Type 2 diabetes mellitus with hyperglycemia: Secondary | ICD-10-CM | POA: Diagnosis not present

## 2016-03-15 DIAGNOSIS — K219 Gastro-esophageal reflux disease without esophagitis: Secondary | ICD-10-CM | POA: Diagnosis not present

## 2016-03-15 DIAGNOSIS — R1013 Epigastric pain: Secondary | ICD-10-CM | POA: Diagnosis not present

## 2016-03-15 DIAGNOSIS — K863 Pseudocyst of pancreas: Secondary | ICD-10-CM | POA: Diagnosis not present

## 2016-04-12 DIAGNOSIS — K863 Pseudocyst of pancreas: Secondary | ICD-10-CM | POA: Diagnosis not present

## 2016-04-12 DIAGNOSIS — E663 Overweight: Secondary | ICD-10-CM | POA: Diagnosis not present

## 2016-04-12 DIAGNOSIS — I10 Essential (primary) hypertension: Secondary | ICD-10-CM | POA: Diagnosis not present

## 2016-04-12 DIAGNOSIS — E1140 Type 2 diabetes mellitus with diabetic neuropathy, unspecified: Secondary | ICD-10-CM | POA: Diagnosis not present

## 2016-04-12 DIAGNOSIS — E039 Hypothyroidism, unspecified: Secondary | ICD-10-CM | POA: Diagnosis not present

## 2016-04-12 DIAGNOSIS — I2699 Other pulmonary embolism without acute cor pulmonale: Secondary | ICD-10-CM | POA: Diagnosis not present

## 2016-04-12 DIAGNOSIS — K219 Gastro-esophageal reflux disease without esophagitis: Secondary | ICD-10-CM | POA: Diagnosis not present

## 2016-04-12 DIAGNOSIS — R634 Abnormal weight loss: Secondary | ICD-10-CM | POA: Diagnosis not present

## 2016-04-12 DIAGNOSIS — E1165 Type 2 diabetes mellitus with hyperglycemia: Secondary | ICD-10-CM | POA: Diagnosis not present

## 2016-04-12 DIAGNOSIS — R1013 Epigastric pain: Secondary | ICD-10-CM | POA: Diagnosis not present

## 2016-04-12 DIAGNOSIS — Z6827 Body mass index (BMI) 27.0-27.9, adult: Secondary | ICD-10-CM | POA: Diagnosis not present

## 2016-05-10 DIAGNOSIS — I2699 Other pulmonary embolism without acute cor pulmonale: Secondary | ICD-10-CM | POA: Diagnosis not present

## 2016-05-10 DIAGNOSIS — R1013 Epigastric pain: Secondary | ICD-10-CM | POA: Diagnosis not present

## 2016-05-10 DIAGNOSIS — R634 Abnormal weight loss: Secondary | ICD-10-CM | POA: Diagnosis not present

## 2016-05-10 DIAGNOSIS — E1140 Type 2 diabetes mellitus with diabetic neuropathy, unspecified: Secondary | ICD-10-CM | POA: Diagnosis not present

## 2016-05-10 DIAGNOSIS — K219 Gastro-esophageal reflux disease without esophagitis: Secondary | ICD-10-CM | POA: Diagnosis not present

## 2016-05-10 DIAGNOSIS — K863 Pseudocyst of pancreas: Secondary | ICD-10-CM | POA: Diagnosis not present

## 2016-05-10 DIAGNOSIS — E1165 Type 2 diabetes mellitus with hyperglycemia: Secondary | ICD-10-CM | POA: Diagnosis not present

## 2016-05-10 DIAGNOSIS — E039 Hypothyroidism, unspecified: Secondary | ICD-10-CM | POA: Diagnosis not present

## 2016-05-10 DIAGNOSIS — E663 Overweight: Secondary | ICD-10-CM | POA: Diagnosis not present

## 2016-05-10 DIAGNOSIS — Z6828 Body mass index (BMI) 28.0-28.9, adult: Secondary | ICD-10-CM | POA: Diagnosis not present

## 2016-05-10 DIAGNOSIS — I10 Essential (primary) hypertension: Secondary | ICD-10-CM | POA: Diagnosis not present

## 2016-06-06 DIAGNOSIS — Z6828 Body mass index (BMI) 28.0-28.9, adult: Secondary | ICD-10-CM | POA: Diagnosis not present

## 2016-06-06 DIAGNOSIS — E663 Overweight: Secondary | ICD-10-CM | POA: Diagnosis not present

## 2016-06-06 DIAGNOSIS — K219 Gastro-esophageal reflux disease without esophagitis: Secondary | ICD-10-CM | POA: Diagnosis not present

## 2016-06-06 DIAGNOSIS — R1013 Epigastric pain: Secondary | ICD-10-CM | POA: Diagnosis not present

## 2016-06-06 DIAGNOSIS — E1140 Type 2 diabetes mellitus with diabetic neuropathy, unspecified: Secondary | ICD-10-CM | POA: Diagnosis not present

## 2016-06-06 DIAGNOSIS — E039 Hypothyroidism, unspecified: Secondary | ICD-10-CM | POA: Diagnosis not present

## 2016-06-06 DIAGNOSIS — I10 Essential (primary) hypertension: Secondary | ICD-10-CM | POA: Diagnosis not present

## 2016-06-06 DIAGNOSIS — E1165 Type 2 diabetes mellitus with hyperglycemia: Secondary | ICD-10-CM | POA: Diagnosis not present

## 2016-06-06 DIAGNOSIS — I2699 Other pulmonary embolism without acute cor pulmonale: Secondary | ICD-10-CM | POA: Diagnosis not present

## 2016-06-06 DIAGNOSIS — R634 Abnormal weight loss: Secondary | ICD-10-CM | POA: Diagnosis not present

## 2016-06-06 DIAGNOSIS — K863 Pseudocyst of pancreas: Secondary | ICD-10-CM | POA: Diagnosis not present

## 2016-07-04 DIAGNOSIS — E663 Overweight: Secondary | ICD-10-CM | POA: Diagnosis not present

## 2016-07-04 DIAGNOSIS — E1140 Type 2 diabetes mellitus with diabetic neuropathy, unspecified: Secondary | ICD-10-CM | POA: Diagnosis not present

## 2016-07-04 DIAGNOSIS — R634 Abnormal weight loss: Secondary | ICD-10-CM | POA: Diagnosis not present

## 2016-07-04 DIAGNOSIS — E1165 Type 2 diabetes mellitus with hyperglycemia: Secondary | ICD-10-CM | POA: Diagnosis not present

## 2016-07-04 DIAGNOSIS — K863 Pseudocyst of pancreas: Secondary | ICD-10-CM | POA: Diagnosis not present

## 2016-07-04 DIAGNOSIS — K219 Gastro-esophageal reflux disease without esophagitis: Secondary | ICD-10-CM | POA: Diagnosis not present

## 2016-07-04 DIAGNOSIS — R1013 Epigastric pain: Secondary | ICD-10-CM | POA: Diagnosis not present

## 2016-07-04 DIAGNOSIS — I10 Essential (primary) hypertension: Secondary | ICD-10-CM | POA: Diagnosis not present

## 2016-07-04 DIAGNOSIS — E039 Hypothyroidism, unspecified: Secondary | ICD-10-CM | POA: Diagnosis not present

## 2016-07-04 DIAGNOSIS — I2699 Other pulmonary embolism without acute cor pulmonale: Secondary | ICD-10-CM | POA: Diagnosis not present

## 2016-07-04 DIAGNOSIS — Z6829 Body mass index (BMI) 29.0-29.9, adult: Secondary | ICD-10-CM | POA: Diagnosis not present

## 2016-07-04 DIAGNOSIS — J209 Acute bronchitis, unspecified: Secondary | ICD-10-CM | POA: Diagnosis not present

## 2016-08-01 DIAGNOSIS — R1013 Epigastric pain: Secondary | ICD-10-CM | POA: Diagnosis not present

## 2016-08-01 DIAGNOSIS — K863 Pseudocyst of pancreas: Secondary | ICD-10-CM | POA: Diagnosis not present

## 2016-08-01 DIAGNOSIS — E663 Overweight: Secondary | ICD-10-CM | POA: Diagnosis not present

## 2016-08-01 DIAGNOSIS — E1165 Type 2 diabetes mellitus with hyperglycemia: Secondary | ICD-10-CM | POA: Diagnosis not present

## 2016-08-01 DIAGNOSIS — E039 Hypothyroidism, unspecified: Secondary | ICD-10-CM | POA: Diagnosis not present

## 2016-08-01 DIAGNOSIS — I10 Essential (primary) hypertension: Secondary | ICD-10-CM | POA: Diagnosis not present

## 2016-08-01 DIAGNOSIS — I2699 Other pulmonary embolism without acute cor pulmonale: Secondary | ICD-10-CM | POA: Diagnosis not present

## 2016-08-01 DIAGNOSIS — E1140 Type 2 diabetes mellitus with diabetic neuropathy, unspecified: Secondary | ICD-10-CM | POA: Diagnosis not present

## 2016-08-01 DIAGNOSIS — R634 Abnormal weight loss: Secondary | ICD-10-CM | POA: Diagnosis not present

## 2016-08-01 DIAGNOSIS — K219 Gastro-esophageal reflux disease without esophagitis: Secondary | ICD-10-CM | POA: Diagnosis not present

## 2016-08-01 DIAGNOSIS — Z6829 Body mass index (BMI) 29.0-29.9, adult: Secondary | ICD-10-CM | POA: Diagnosis not present

## 2016-08-29 DIAGNOSIS — Z6829 Body mass index (BMI) 29.0-29.9, adult: Secondary | ICD-10-CM | POA: Diagnosis not present

## 2016-08-29 DIAGNOSIS — K219 Gastro-esophageal reflux disease without esophagitis: Secondary | ICD-10-CM | POA: Diagnosis not present

## 2016-08-29 DIAGNOSIS — E1165 Type 2 diabetes mellitus with hyperglycemia: Secondary | ICD-10-CM | POA: Diagnosis not present

## 2016-08-29 DIAGNOSIS — K863 Pseudocyst of pancreas: Secondary | ICD-10-CM | POA: Diagnosis not present

## 2016-08-29 DIAGNOSIS — E663 Overweight: Secondary | ICD-10-CM | POA: Diagnosis not present

## 2016-08-29 DIAGNOSIS — I10 Essential (primary) hypertension: Secondary | ICD-10-CM | POA: Diagnosis not present

## 2016-08-29 DIAGNOSIS — R634 Abnormal weight loss: Secondary | ICD-10-CM | POA: Diagnosis not present

## 2016-08-29 DIAGNOSIS — E1140 Type 2 diabetes mellitus with diabetic neuropathy, unspecified: Secondary | ICD-10-CM | POA: Diagnosis not present

## 2016-08-29 DIAGNOSIS — E039 Hypothyroidism, unspecified: Secondary | ICD-10-CM | POA: Diagnosis not present

## 2016-08-29 DIAGNOSIS — R1013 Epigastric pain: Secondary | ICD-10-CM | POA: Diagnosis not present

## 2016-08-29 DIAGNOSIS — I2699 Other pulmonary embolism without acute cor pulmonale: Secondary | ICD-10-CM | POA: Diagnosis not present

## 2016-09-11 DIAGNOSIS — J209 Acute bronchitis, unspecified: Secondary | ICD-10-CM | POA: Diagnosis not present

## 2016-09-26 DIAGNOSIS — E663 Overweight: Secondary | ICD-10-CM | POA: Diagnosis not present

## 2016-09-26 DIAGNOSIS — J209 Acute bronchitis, unspecified: Secondary | ICD-10-CM | POA: Diagnosis not present

## 2016-09-26 DIAGNOSIS — R1013 Epigastric pain: Secondary | ICD-10-CM | POA: Diagnosis not present

## 2016-09-26 DIAGNOSIS — K219 Gastro-esophageal reflux disease without esophagitis: Secondary | ICD-10-CM | POA: Diagnosis not present

## 2016-09-26 DIAGNOSIS — Z6829 Body mass index (BMI) 29.0-29.9, adult: Secondary | ICD-10-CM | POA: Diagnosis not present

## 2016-09-26 DIAGNOSIS — E1140 Type 2 diabetes mellitus with diabetic neuropathy, unspecified: Secondary | ICD-10-CM | POA: Diagnosis not present

## 2016-09-26 DIAGNOSIS — E1165 Type 2 diabetes mellitus with hyperglycemia: Secondary | ICD-10-CM | POA: Diagnosis not present

## 2016-09-26 DIAGNOSIS — I2699 Other pulmonary embolism without acute cor pulmonale: Secondary | ICD-10-CM | POA: Diagnosis not present

## 2016-09-26 DIAGNOSIS — E039 Hypothyroidism, unspecified: Secondary | ICD-10-CM | POA: Diagnosis not present

## 2016-09-26 DIAGNOSIS — K863 Pseudocyst of pancreas: Secondary | ICD-10-CM | POA: Diagnosis not present

## 2016-09-26 DIAGNOSIS — I10 Essential (primary) hypertension: Secondary | ICD-10-CM | POA: Diagnosis not present

## 2016-09-26 DIAGNOSIS — R634 Abnormal weight loss: Secondary | ICD-10-CM | POA: Diagnosis not present

## 2016-10-20 DIAGNOSIS — Z8719 Personal history of other diseases of the digestive system: Secondary | ICD-10-CM | POA: Diagnosis not present

## 2016-10-20 DIAGNOSIS — Z9689 Presence of other specified functional implants: Secondary | ICD-10-CM | POA: Diagnosis not present

## 2016-10-20 DIAGNOSIS — F17210 Nicotine dependence, cigarettes, uncomplicated: Secondary | ICD-10-CM | POA: Diagnosis not present

## 2016-10-20 DIAGNOSIS — K862 Cyst of pancreas: Secondary | ICD-10-CM | POA: Diagnosis not present

## 2016-10-20 DIAGNOSIS — R1012 Left upper quadrant pain: Secondary | ICD-10-CM | POA: Diagnosis not present

## 2016-10-20 DIAGNOSIS — Z9081 Acquired absence of spleen: Secondary | ICD-10-CM | POA: Diagnosis not present

## 2016-10-20 DIAGNOSIS — Z90411 Acquired partial absence of pancreas: Secondary | ICD-10-CM | POA: Diagnosis not present

## 2016-10-20 DIAGNOSIS — R140 Abdominal distension (gaseous): Secondary | ICD-10-CM | POA: Diagnosis not present

## 2016-10-20 DIAGNOSIS — Z9889 Other specified postprocedural states: Secondary | ICD-10-CM | POA: Diagnosis not present

## 2016-10-25 DIAGNOSIS — I10 Essential (primary) hypertension: Secondary | ICD-10-CM | POA: Diagnosis not present

## 2016-10-25 DIAGNOSIS — R634 Abnormal weight loss: Secondary | ICD-10-CM | POA: Diagnosis not present

## 2016-10-25 DIAGNOSIS — I2699 Other pulmonary embolism without acute cor pulmonale: Secondary | ICD-10-CM | POA: Diagnosis not present

## 2016-10-25 DIAGNOSIS — K863 Pseudocyst of pancreas: Secondary | ICD-10-CM | POA: Diagnosis not present

## 2016-10-25 DIAGNOSIS — E1165 Type 2 diabetes mellitus with hyperglycemia: Secondary | ICD-10-CM | POA: Diagnosis not present

## 2016-10-25 DIAGNOSIS — K219 Gastro-esophageal reflux disease without esophagitis: Secondary | ICD-10-CM | POA: Diagnosis not present

## 2016-10-25 DIAGNOSIS — R1013 Epigastric pain: Secondary | ICD-10-CM | POA: Diagnosis not present

## 2016-10-25 DIAGNOSIS — E1140 Type 2 diabetes mellitus with diabetic neuropathy, unspecified: Secondary | ICD-10-CM | POA: Diagnosis not present

## 2016-10-25 DIAGNOSIS — Z6829 Body mass index (BMI) 29.0-29.9, adult: Secondary | ICD-10-CM | POA: Diagnosis not present

## 2016-10-25 DIAGNOSIS — E663 Overweight: Secondary | ICD-10-CM | POA: Diagnosis not present

## 2016-10-25 DIAGNOSIS — E039 Hypothyroidism, unspecified: Secondary | ICD-10-CM | POA: Diagnosis not present

## 2016-10-31 DIAGNOSIS — R1012 Left upper quadrant pain: Secondary | ICD-10-CM | POA: Diagnosis not present

## 2016-10-31 DIAGNOSIS — K862 Cyst of pancreas: Secondary | ICD-10-CM | POA: Diagnosis not present

## 2016-10-31 DIAGNOSIS — Z431 Encounter for attention to gastrostomy: Secondary | ICD-10-CM | POA: Diagnosis not present

## 2016-11-18 DIAGNOSIS — A048 Other specified bacterial intestinal infections: Secondary | ICD-10-CM | POA: Diagnosis not present

## 2016-11-18 DIAGNOSIS — A049 Bacterial intestinal infection, unspecified: Secondary | ICD-10-CM | POA: Diagnosis not present

## 2016-11-18 DIAGNOSIS — R140 Abdominal distension (gaseous): Secondary | ICD-10-CM | POA: Diagnosis not present

## 2016-11-30 DIAGNOSIS — R1013 Epigastric pain: Secondary | ICD-10-CM | POA: Diagnosis not present

## 2016-11-30 DIAGNOSIS — R634 Abnormal weight loss: Secondary | ICD-10-CM | POA: Diagnosis not present

## 2016-11-30 DIAGNOSIS — E1140 Type 2 diabetes mellitus with diabetic neuropathy, unspecified: Secondary | ICD-10-CM | POA: Diagnosis not present

## 2016-11-30 DIAGNOSIS — Z6829 Body mass index (BMI) 29.0-29.9, adult: Secondary | ICD-10-CM | POA: Diagnosis not present

## 2016-11-30 DIAGNOSIS — E039 Hypothyroidism, unspecified: Secondary | ICD-10-CM | POA: Diagnosis not present

## 2016-11-30 DIAGNOSIS — K219 Gastro-esophageal reflux disease without esophagitis: Secondary | ICD-10-CM | POA: Diagnosis not present

## 2016-11-30 DIAGNOSIS — K863 Pseudocyst of pancreas: Secondary | ICD-10-CM | POA: Diagnosis not present

## 2016-11-30 DIAGNOSIS — E1165 Type 2 diabetes mellitus with hyperglycemia: Secondary | ICD-10-CM | POA: Diagnosis not present

## 2016-11-30 DIAGNOSIS — I2699 Other pulmonary embolism without acute cor pulmonale: Secondary | ICD-10-CM | POA: Diagnosis not present

## 2016-11-30 DIAGNOSIS — I10 Essential (primary) hypertension: Secondary | ICD-10-CM | POA: Diagnosis not present

## 2016-12-28 DIAGNOSIS — I10 Essential (primary) hypertension: Secondary | ICD-10-CM | POA: Diagnosis not present

## 2016-12-28 DIAGNOSIS — R1013 Epigastric pain: Secondary | ICD-10-CM | POA: Diagnosis not present

## 2016-12-28 DIAGNOSIS — Z6829 Body mass index (BMI) 29.0-29.9, adult: Secondary | ICD-10-CM | POA: Diagnosis not present

## 2016-12-28 DIAGNOSIS — K863 Pseudocyst of pancreas: Secondary | ICD-10-CM | POA: Diagnosis not present

## 2016-12-28 DIAGNOSIS — R634 Abnormal weight loss: Secondary | ICD-10-CM | POA: Diagnosis not present

## 2016-12-28 DIAGNOSIS — E1165 Type 2 diabetes mellitus with hyperglycemia: Secondary | ICD-10-CM | POA: Diagnosis not present

## 2016-12-28 DIAGNOSIS — I2699 Other pulmonary embolism without acute cor pulmonale: Secondary | ICD-10-CM | POA: Diagnosis not present

## 2016-12-28 DIAGNOSIS — K219 Gastro-esophageal reflux disease without esophagitis: Secondary | ICD-10-CM | POA: Diagnosis not present

## 2016-12-28 DIAGNOSIS — E039 Hypothyroidism, unspecified: Secondary | ICD-10-CM | POA: Diagnosis not present

## 2016-12-28 DIAGNOSIS — E1140 Type 2 diabetes mellitus with diabetic neuropathy, unspecified: Secondary | ICD-10-CM | POA: Diagnosis not present

## 2016-12-28 DIAGNOSIS — R0600 Dyspnea, unspecified: Secondary | ICD-10-CM | POA: Diagnosis not present

## 2017-01-11 DIAGNOSIS — E1140 Type 2 diabetes mellitus with diabetic neuropathy, unspecified: Secondary | ICD-10-CM | POA: Diagnosis not present

## 2017-01-11 DIAGNOSIS — K863 Pseudocyst of pancreas: Secondary | ICD-10-CM | POA: Diagnosis not present

## 2017-01-11 DIAGNOSIS — R1013 Epigastric pain: Secondary | ICD-10-CM | POA: Diagnosis not present

## 2017-01-11 DIAGNOSIS — E1165 Type 2 diabetes mellitus with hyperglycemia: Secondary | ICD-10-CM | POA: Diagnosis not present

## 2017-01-11 DIAGNOSIS — I2699 Other pulmonary embolism without acute cor pulmonale: Secondary | ICD-10-CM | POA: Diagnosis not present

## 2017-01-11 DIAGNOSIS — Z23 Encounter for immunization: Secondary | ICD-10-CM | POA: Diagnosis not present

## 2017-01-11 DIAGNOSIS — E039 Hypothyroidism, unspecified: Secondary | ICD-10-CM | POA: Diagnosis not present

## 2017-01-11 DIAGNOSIS — I10 Essential (primary) hypertension: Secondary | ICD-10-CM | POA: Diagnosis not present

## 2017-01-11 DIAGNOSIS — K219 Gastro-esophageal reflux disease without esophagitis: Secondary | ICD-10-CM | POA: Diagnosis not present

## 2017-01-11 DIAGNOSIS — R0600 Dyspnea, unspecified: Secondary | ICD-10-CM | POA: Diagnosis not present

## 2017-01-11 DIAGNOSIS — R634 Abnormal weight loss: Secondary | ICD-10-CM | POA: Diagnosis not present

## 2017-01-11 DIAGNOSIS — Z6829 Body mass index (BMI) 29.0-29.9, adult: Secondary | ICD-10-CM | POA: Diagnosis not present

## 2017-02-14 DIAGNOSIS — K863 Pseudocyst of pancreas: Secondary | ICD-10-CM | POA: Diagnosis not present

## 2017-02-14 DIAGNOSIS — Z1331 Encounter for screening for depression: Secondary | ICD-10-CM | POA: Diagnosis not present

## 2017-02-14 DIAGNOSIS — E1140 Type 2 diabetes mellitus with diabetic neuropathy, unspecified: Secondary | ICD-10-CM | POA: Diagnosis not present

## 2017-02-14 DIAGNOSIS — H9193 Unspecified hearing loss, bilateral: Secondary | ICD-10-CM | POA: Diagnosis not present

## 2017-02-14 DIAGNOSIS — K219 Gastro-esophageal reflux disease without esophagitis: Secondary | ICD-10-CM | POA: Diagnosis not present

## 2017-02-14 DIAGNOSIS — R0600 Dyspnea, unspecified: Secondary | ICD-10-CM | POA: Diagnosis not present

## 2017-02-14 DIAGNOSIS — I10 Essential (primary) hypertension: Secondary | ICD-10-CM | POA: Diagnosis not present

## 2017-02-14 DIAGNOSIS — I2699 Other pulmonary embolism without acute cor pulmonale: Secondary | ICD-10-CM | POA: Diagnosis not present

## 2017-02-14 DIAGNOSIS — E039 Hypothyroidism, unspecified: Secondary | ICD-10-CM | POA: Diagnosis not present

## 2017-02-14 DIAGNOSIS — E1165 Type 2 diabetes mellitus with hyperglycemia: Secondary | ICD-10-CM | POA: Diagnosis not present

## 2017-02-14 DIAGNOSIS — R1013 Epigastric pain: Secondary | ICD-10-CM | POA: Diagnosis not present

## 2017-02-14 DIAGNOSIS — Z9181 History of falling: Secondary | ICD-10-CM | POA: Diagnosis not present

## 2017-03-28 DIAGNOSIS — K863 Pseudocyst of pancreas: Secondary | ICD-10-CM | POA: Diagnosis not present

## 2017-03-28 DIAGNOSIS — Z6829 Body mass index (BMI) 29.0-29.9, adult: Secondary | ICD-10-CM | POA: Diagnosis not present

## 2017-03-28 DIAGNOSIS — E039 Hypothyroidism, unspecified: Secondary | ICD-10-CM | POA: Diagnosis not present

## 2017-03-28 DIAGNOSIS — E1165 Type 2 diabetes mellitus with hyperglycemia: Secondary | ICD-10-CM | POA: Diagnosis not present

## 2017-03-28 DIAGNOSIS — I10 Essential (primary) hypertension: Secondary | ICD-10-CM | POA: Diagnosis not present

## 2017-03-28 DIAGNOSIS — H9193 Unspecified hearing loss, bilateral: Secondary | ICD-10-CM | POA: Diagnosis not present

## 2017-03-28 DIAGNOSIS — R1013 Epigastric pain: Secondary | ICD-10-CM | POA: Diagnosis not present

## 2017-03-28 DIAGNOSIS — E1140 Type 2 diabetes mellitus with diabetic neuropathy, unspecified: Secondary | ICD-10-CM | POA: Diagnosis not present

## 2017-03-28 DIAGNOSIS — R634 Abnormal weight loss: Secondary | ICD-10-CM | POA: Diagnosis not present

## 2017-03-28 DIAGNOSIS — K219 Gastro-esophageal reflux disease without esophagitis: Secondary | ICD-10-CM | POA: Diagnosis not present

## 2017-03-28 DIAGNOSIS — I2699 Other pulmonary embolism without acute cor pulmonale: Secondary | ICD-10-CM | POA: Diagnosis not present

## 2017-03-28 DIAGNOSIS — R0600 Dyspnea, unspecified: Secondary | ICD-10-CM | POA: Diagnosis not present

## 2017-04-06 DIAGNOSIS — I10 Essential (primary) hypertension: Secondary | ICD-10-CM | POA: Diagnosis not present

## 2017-04-06 DIAGNOSIS — I2699 Other pulmonary embolism without acute cor pulmonale: Secondary | ICD-10-CM | POA: Diagnosis not present

## 2017-04-06 DIAGNOSIS — K219 Gastro-esophageal reflux disease without esophagitis: Secondary | ICD-10-CM | POA: Diagnosis not present

## 2017-04-06 DIAGNOSIS — Z6829 Body mass index (BMI) 29.0-29.9, adult: Secondary | ICD-10-CM | POA: Diagnosis not present

## 2017-04-06 DIAGNOSIS — E039 Hypothyroidism, unspecified: Secondary | ICD-10-CM | POA: Diagnosis not present

## 2017-04-06 DIAGNOSIS — E1140 Type 2 diabetes mellitus with diabetic neuropathy, unspecified: Secondary | ICD-10-CM | POA: Diagnosis not present

## 2017-04-06 DIAGNOSIS — R634 Abnormal weight loss: Secondary | ICD-10-CM | POA: Diagnosis not present

## 2017-04-06 DIAGNOSIS — R1013 Epigastric pain: Secondary | ICD-10-CM | POA: Diagnosis not present

## 2017-04-06 DIAGNOSIS — R0600 Dyspnea, unspecified: Secondary | ICD-10-CM | POA: Diagnosis not present

## 2017-04-06 DIAGNOSIS — H9193 Unspecified hearing loss, bilateral: Secondary | ICD-10-CM | POA: Diagnosis not present

## 2017-04-06 DIAGNOSIS — K863 Pseudocyst of pancreas: Secondary | ICD-10-CM | POA: Diagnosis not present

## 2017-04-06 DIAGNOSIS — E1165 Type 2 diabetes mellitus with hyperglycemia: Secondary | ICD-10-CM | POA: Diagnosis not present

## 2017-04-20 DIAGNOSIS — I2699 Other pulmonary embolism without acute cor pulmonale: Secondary | ICD-10-CM | POA: Diagnosis not present

## 2017-04-20 DIAGNOSIS — R634 Abnormal weight loss: Secondary | ICD-10-CM | POA: Diagnosis not present

## 2017-04-20 DIAGNOSIS — R1013 Epigastric pain: Secondary | ICD-10-CM | POA: Diagnosis not present

## 2017-04-20 DIAGNOSIS — R0600 Dyspnea, unspecified: Secondary | ICD-10-CM | POA: Diagnosis not present

## 2017-04-20 DIAGNOSIS — E039 Hypothyroidism, unspecified: Secondary | ICD-10-CM | POA: Diagnosis not present

## 2017-04-20 DIAGNOSIS — E1165 Type 2 diabetes mellitus with hyperglycemia: Secondary | ICD-10-CM | POA: Diagnosis not present

## 2017-04-20 DIAGNOSIS — K863 Pseudocyst of pancreas: Secondary | ICD-10-CM | POA: Diagnosis not present

## 2017-04-20 DIAGNOSIS — E1140 Type 2 diabetes mellitus with diabetic neuropathy, unspecified: Secondary | ICD-10-CM | POA: Diagnosis not present

## 2017-04-20 DIAGNOSIS — K219 Gastro-esophageal reflux disease without esophagitis: Secondary | ICD-10-CM | POA: Diagnosis not present

## 2017-04-20 DIAGNOSIS — I10 Essential (primary) hypertension: Secondary | ICD-10-CM | POA: Diagnosis not present

## 2017-04-20 DIAGNOSIS — M545 Low back pain: Secondary | ICD-10-CM | POA: Diagnosis not present

## 2017-04-20 DIAGNOSIS — H9193 Unspecified hearing loss, bilateral: Secondary | ICD-10-CM | POA: Diagnosis not present

## 2017-05-03 DIAGNOSIS — R634 Abnormal weight loss: Secondary | ICD-10-CM | POA: Diagnosis not present

## 2017-05-03 DIAGNOSIS — R1013 Epigastric pain: Secondary | ICD-10-CM | POA: Diagnosis not present

## 2017-05-03 DIAGNOSIS — R0600 Dyspnea, unspecified: Secondary | ICD-10-CM | POA: Diagnosis not present

## 2017-05-03 DIAGNOSIS — K219 Gastro-esophageal reflux disease without esophagitis: Secondary | ICD-10-CM | POA: Diagnosis not present

## 2017-05-03 DIAGNOSIS — I10 Essential (primary) hypertension: Secondary | ICD-10-CM | POA: Diagnosis not present

## 2017-05-03 DIAGNOSIS — E1140 Type 2 diabetes mellitus with diabetic neuropathy, unspecified: Secondary | ICD-10-CM | POA: Diagnosis not present

## 2017-05-03 DIAGNOSIS — I2699 Other pulmonary embolism without acute cor pulmonale: Secondary | ICD-10-CM | POA: Diagnosis not present

## 2017-05-03 DIAGNOSIS — H9193 Unspecified hearing loss, bilateral: Secondary | ICD-10-CM | POA: Diagnosis not present

## 2017-05-03 DIAGNOSIS — E039 Hypothyroidism, unspecified: Secondary | ICD-10-CM | POA: Diagnosis not present

## 2017-05-03 DIAGNOSIS — M545 Low back pain: Secondary | ICD-10-CM | POA: Diagnosis not present

## 2017-05-03 DIAGNOSIS — K863 Pseudocyst of pancreas: Secondary | ICD-10-CM | POA: Diagnosis not present

## 2017-05-03 DIAGNOSIS — E1165 Type 2 diabetes mellitus with hyperglycemia: Secondary | ICD-10-CM | POA: Diagnosis not present

## 2017-05-17 DIAGNOSIS — R0600 Dyspnea, unspecified: Secondary | ICD-10-CM | POA: Diagnosis not present

## 2017-05-17 DIAGNOSIS — M545 Low back pain: Secondary | ICD-10-CM | POA: Diagnosis not present

## 2017-05-17 DIAGNOSIS — I2699 Other pulmonary embolism without acute cor pulmonale: Secondary | ICD-10-CM | POA: Diagnosis not present

## 2017-05-17 DIAGNOSIS — E039 Hypothyroidism, unspecified: Secondary | ICD-10-CM | POA: Diagnosis not present

## 2017-05-17 DIAGNOSIS — E1165 Type 2 diabetes mellitus with hyperglycemia: Secondary | ICD-10-CM | POA: Diagnosis not present

## 2017-05-17 DIAGNOSIS — R5382 Chronic fatigue, unspecified: Secondary | ICD-10-CM | POA: Diagnosis not present

## 2017-05-17 DIAGNOSIS — R1013 Epigastric pain: Secondary | ICD-10-CM | POA: Diagnosis not present

## 2017-05-17 DIAGNOSIS — R634 Abnormal weight loss: Secondary | ICD-10-CM | POA: Diagnosis not present

## 2017-05-17 DIAGNOSIS — I10 Essential (primary) hypertension: Secondary | ICD-10-CM | POA: Diagnosis not present

## 2017-05-17 DIAGNOSIS — E1140 Type 2 diabetes mellitus with diabetic neuropathy, unspecified: Secondary | ICD-10-CM | POA: Diagnosis not present

## 2017-05-17 DIAGNOSIS — K219 Gastro-esophageal reflux disease without esophagitis: Secondary | ICD-10-CM | POA: Diagnosis not present

## 2017-05-17 DIAGNOSIS — K863 Pseudocyst of pancreas: Secondary | ICD-10-CM | POA: Diagnosis not present

## 2017-05-31 DIAGNOSIS — R0600 Dyspnea, unspecified: Secondary | ICD-10-CM | POA: Diagnosis not present

## 2017-05-31 DIAGNOSIS — E1165 Type 2 diabetes mellitus with hyperglycemia: Secondary | ICD-10-CM | POA: Diagnosis not present

## 2017-05-31 DIAGNOSIS — H9193 Unspecified hearing loss, bilateral: Secondary | ICD-10-CM | POA: Diagnosis not present

## 2017-05-31 DIAGNOSIS — I2699 Other pulmonary embolism without acute cor pulmonale: Secondary | ICD-10-CM | POA: Diagnosis not present

## 2017-05-31 DIAGNOSIS — E1140 Type 2 diabetes mellitus with diabetic neuropathy, unspecified: Secondary | ICD-10-CM | POA: Diagnosis not present

## 2017-05-31 DIAGNOSIS — I10 Essential (primary) hypertension: Secondary | ICD-10-CM | POA: Diagnosis not present

## 2017-05-31 DIAGNOSIS — K863 Pseudocyst of pancreas: Secondary | ICD-10-CM | POA: Diagnosis not present

## 2017-05-31 DIAGNOSIS — K219 Gastro-esophageal reflux disease without esophagitis: Secondary | ICD-10-CM | POA: Diagnosis not present

## 2017-05-31 DIAGNOSIS — M545 Low back pain: Secondary | ICD-10-CM | POA: Diagnosis not present

## 2017-05-31 DIAGNOSIS — E039 Hypothyroidism, unspecified: Secondary | ICD-10-CM | POA: Diagnosis not present

## 2017-05-31 DIAGNOSIS — R1013 Epigastric pain: Secondary | ICD-10-CM | POA: Diagnosis not present

## 2017-05-31 DIAGNOSIS — E291 Testicular hypofunction: Secondary | ICD-10-CM | POA: Diagnosis not present

## 2017-07-06 DIAGNOSIS — E291 Testicular hypofunction: Secondary | ICD-10-CM | POA: Diagnosis not present

## 2017-07-06 DIAGNOSIS — M545 Low back pain: Secondary | ICD-10-CM | POA: Diagnosis not present

## 2017-07-06 DIAGNOSIS — H9193 Unspecified hearing loss, bilateral: Secondary | ICD-10-CM | POA: Diagnosis not present

## 2017-07-06 DIAGNOSIS — R0600 Dyspnea, unspecified: Secondary | ICD-10-CM | POA: Diagnosis not present

## 2017-07-06 DIAGNOSIS — I10 Essential (primary) hypertension: Secondary | ICD-10-CM | POA: Diagnosis not present

## 2017-07-06 DIAGNOSIS — R1013 Epigastric pain: Secondary | ICD-10-CM | POA: Diagnosis not present

## 2017-07-06 DIAGNOSIS — I2699 Other pulmonary embolism without acute cor pulmonale: Secondary | ICD-10-CM | POA: Diagnosis not present

## 2017-07-06 DIAGNOSIS — E1165 Type 2 diabetes mellitus with hyperglycemia: Secondary | ICD-10-CM | POA: Diagnosis not present

## 2017-07-06 DIAGNOSIS — K863 Pseudocyst of pancreas: Secondary | ICD-10-CM | POA: Diagnosis not present

## 2017-07-06 DIAGNOSIS — E039 Hypothyroidism, unspecified: Secondary | ICD-10-CM | POA: Diagnosis not present

## 2017-07-06 DIAGNOSIS — E1140 Type 2 diabetes mellitus with diabetic neuropathy, unspecified: Secondary | ICD-10-CM | POA: Diagnosis not present

## 2017-08-09 DIAGNOSIS — E1140 Type 2 diabetes mellitus with diabetic neuropathy, unspecified: Secondary | ICD-10-CM | POA: Diagnosis not present

## 2017-08-09 DIAGNOSIS — E291 Testicular hypofunction: Secondary | ICD-10-CM | POA: Diagnosis not present

## 2017-08-09 DIAGNOSIS — E1165 Type 2 diabetes mellitus with hyperglycemia: Secondary | ICD-10-CM | POA: Diagnosis not present

## 2017-08-09 DIAGNOSIS — I2699 Other pulmonary embolism without acute cor pulmonale: Secondary | ICD-10-CM | POA: Diagnosis not present

## 2017-09-14 DIAGNOSIS — F17200 Nicotine dependence, unspecified, uncomplicated: Secondary | ICD-10-CM | POA: Diagnosis not present

## 2017-09-14 DIAGNOSIS — E1140 Type 2 diabetes mellitus with diabetic neuropathy, unspecified: Secondary | ICD-10-CM | POA: Diagnosis not present

## 2017-09-14 DIAGNOSIS — E291 Testicular hypofunction: Secondary | ICD-10-CM | POA: Diagnosis not present

## 2017-09-14 DIAGNOSIS — R413 Other amnesia: Secondary | ICD-10-CM | POA: Diagnosis not present

## 2017-09-14 DIAGNOSIS — E039 Hypothyroidism, unspecified: Secondary | ICD-10-CM | POA: Diagnosis not present

## 2017-09-14 DIAGNOSIS — I10 Essential (primary) hypertension: Secondary | ICD-10-CM | POA: Diagnosis not present

## 2017-09-14 DIAGNOSIS — E1165 Type 2 diabetes mellitus with hyperglycemia: Secondary | ICD-10-CM | POA: Diagnosis not present

## 2017-09-19 DIAGNOSIS — H539 Unspecified visual disturbance: Secondary | ICD-10-CM | POA: Diagnosis not present

## 2017-09-19 DIAGNOSIS — J449 Chronic obstructive pulmonary disease, unspecified: Secondary | ICD-10-CM | POA: Diagnosis not present

## 2017-09-19 DIAGNOSIS — E119 Type 2 diabetes mellitus without complications: Secondary | ICD-10-CM | POA: Diagnosis not present

## 2017-09-19 DIAGNOSIS — E785 Hyperlipidemia, unspecified: Secondary | ICD-10-CM | POA: Diagnosis not present

## 2017-09-19 DIAGNOSIS — R0602 Shortness of breath: Secondary | ICD-10-CM | POA: Diagnosis not present

## 2017-09-19 DIAGNOSIS — H9312 Tinnitus, left ear: Secondary | ICD-10-CM | POA: Diagnosis not present

## 2017-09-19 DIAGNOSIS — R51 Headache: Secondary | ICD-10-CM | POA: Diagnosis not present

## 2017-09-19 DIAGNOSIS — R42 Dizziness and giddiness: Secondary | ICD-10-CM | POA: Diagnosis not present

## 2017-09-19 DIAGNOSIS — I10 Essential (primary) hypertension: Secondary | ICD-10-CM | POA: Diagnosis not present

## 2017-09-19 DIAGNOSIS — Z86711 Personal history of pulmonary embolism: Secondary | ICD-10-CM | POA: Diagnosis not present

## 2017-09-19 DIAGNOSIS — K862 Cyst of pancreas: Secondary | ICD-10-CM | POA: Diagnosis not present

## 2017-09-19 DIAGNOSIS — Z7984 Long term (current) use of oral hypoglycemic drugs: Secondary | ICD-10-CM | POA: Diagnosis not present

## 2017-09-19 DIAGNOSIS — Z7982 Long term (current) use of aspirin: Secondary | ICD-10-CM | POA: Diagnosis not present

## 2017-09-19 DIAGNOSIS — Z8673 Personal history of transient ischemic attack (TIA), and cerebral infarction without residual deficits: Secondary | ICD-10-CM | POA: Diagnosis not present

## 2017-09-19 DIAGNOSIS — K863 Pseudocyst of pancreas: Secondary | ICD-10-CM | POA: Diagnosis not present

## 2017-09-19 DIAGNOSIS — H532 Diplopia: Secondary | ICD-10-CM | POA: Diagnosis not present

## 2017-09-19 DIAGNOSIS — F17210 Nicotine dependence, cigarettes, uncomplicated: Secondary | ICD-10-CM | POA: Diagnosis not present

## 2017-09-19 DIAGNOSIS — E039 Hypothyroidism, unspecified: Secondary | ICD-10-CM | POA: Diagnosis not present

## 2017-09-19 DIAGNOSIS — R200 Anesthesia of skin: Secondary | ICD-10-CM | POA: Diagnosis not present

## 2017-09-20 DIAGNOSIS — I10 Essential (primary) hypertension: Secondary | ICD-10-CM | POA: Diagnosis not present

## 2017-09-20 DIAGNOSIS — R791 Abnormal coagulation profile: Secondary | ICD-10-CM | POA: Diagnosis not present

## 2017-09-20 DIAGNOSIS — Z86711 Personal history of pulmonary embolism: Secondary | ICD-10-CM | POA: Diagnosis not present

## 2017-09-20 DIAGNOSIS — R079 Chest pain, unspecified: Secondary | ICD-10-CM | POA: Diagnosis not present

## 2017-09-20 DIAGNOSIS — R0602 Shortness of breath: Secondary | ICD-10-CM | POA: Diagnosis not present

## 2017-09-20 DIAGNOSIS — R55 Syncope and collapse: Secondary | ICD-10-CM | POA: Diagnosis not present

## 2017-09-20 DIAGNOSIS — R29818 Other symptoms and signs involving the nervous system: Secondary | ICD-10-CM | POA: Diagnosis not present

## 2017-09-20 DIAGNOSIS — R42 Dizziness and giddiness: Secondary | ICD-10-CM | POA: Diagnosis not present

## 2017-09-20 DIAGNOSIS — E785 Hyperlipidemia, unspecified: Secondary | ICD-10-CM | POA: Diagnosis not present

## 2017-09-20 DIAGNOSIS — E119 Type 2 diabetes mellitus without complications: Secondary | ICD-10-CM | POA: Diagnosis not present

## 2017-09-20 DIAGNOSIS — R0600 Dyspnea, unspecified: Secondary | ICD-10-CM | POA: Diagnosis not present

## 2017-09-20 DIAGNOSIS — F17210 Nicotine dependence, cigarettes, uncomplicated: Secondary | ICD-10-CM | POA: Diagnosis not present

## 2017-09-20 DIAGNOSIS — Z9981 Dependence on supplemental oxygen: Secondary | ICD-10-CM | POA: Diagnosis not present

## 2017-09-21 DIAGNOSIS — R42 Dizziness and giddiness: Secondary | ICD-10-CM | POA: Diagnosis not present

## 2017-09-21 DIAGNOSIS — Z86711 Personal history of pulmonary embolism: Secondary | ICD-10-CM | POA: Diagnosis not present

## 2017-09-21 DIAGNOSIS — E785 Hyperlipidemia, unspecified: Secondary | ICD-10-CM | POA: Diagnosis not present

## 2017-09-21 DIAGNOSIS — I10 Essential (primary) hypertension: Secondary | ICD-10-CM | POA: Diagnosis not present

## 2017-09-21 DIAGNOSIS — E119 Type 2 diabetes mellitus without complications: Secondary | ICD-10-CM | POA: Diagnosis not present

## 2017-09-21 DIAGNOSIS — K863 Pseudocyst of pancreas: Secondary | ICD-10-CM | POA: Diagnosis not present

## 2017-09-29 DIAGNOSIS — E1140 Type 2 diabetes mellitus with diabetic neuropathy, unspecified: Secondary | ICD-10-CM | POA: Diagnosis not present

## 2017-09-29 DIAGNOSIS — Z1339 Encounter for screening examination for other mental health and behavioral disorders: Secondary | ICD-10-CM | POA: Diagnosis not present

## 2017-09-29 DIAGNOSIS — R413 Other amnesia: Secondary | ICD-10-CM | POA: Diagnosis not present

## 2017-09-29 DIAGNOSIS — I639 Cerebral infarction, unspecified: Secondary | ICD-10-CM | POA: Diagnosis not present

## 2017-09-29 DIAGNOSIS — E291 Testicular hypofunction: Secondary | ICD-10-CM | POA: Diagnosis not present

## 2017-10-19 DIAGNOSIS — K6389 Other specified diseases of intestine: Secondary | ICD-10-CM | POA: Diagnosis not present

## 2017-10-19 DIAGNOSIS — Z79899 Other long term (current) drug therapy: Secondary | ICD-10-CM | POA: Diagnosis not present

## 2017-10-19 DIAGNOSIS — K862 Cyst of pancreas: Secondary | ICD-10-CM | POA: Diagnosis not present

## 2017-10-24 DIAGNOSIS — Z77122 Contact with and (suspected) exposure to noise: Secondary | ICD-10-CM | POA: Diagnosis not present

## 2017-10-24 DIAGNOSIS — H903 Sensorineural hearing loss, bilateral: Secondary | ICD-10-CM | POA: Diagnosis not present

## 2017-10-24 DIAGNOSIS — H9319 Tinnitus, unspecified ear: Secondary | ICD-10-CM | POA: Diagnosis not present

## 2017-10-25 DIAGNOSIS — I639 Cerebral infarction, unspecified: Secondary | ICD-10-CM | POA: Diagnosis not present

## 2017-10-25 DIAGNOSIS — F17200 Nicotine dependence, unspecified, uncomplicated: Secondary | ICD-10-CM | POA: Diagnosis not present

## 2017-10-25 DIAGNOSIS — E291 Testicular hypofunction: Secondary | ICD-10-CM | POA: Diagnosis not present

## 2017-10-25 DIAGNOSIS — E1140 Type 2 diabetes mellitus with diabetic neuropathy, unspecified: Secondary | ICD-10-CM | POA: Diagnosis not present

## 2017-10-25 DIAGNOSIS — R413 Other amnesia: Secondary | ICD-10-CM | POA: Diagnosis not present

## 2017-11-08 DIAGNOSIS — R413 Other amnesia: Secondary | ICD-10-CM | POA: Diagnosis not present

## 2017-11-08 DIAGNOSIS — I639 Cerebral infarction, unspecified: Secondary | ICD-10-CM | POA: Diagnosis not present

## 2017-11-08 DIAGNOSIS — E291 Testicular hypofunction: Secondary | ICD-10-CM | POA: Diagnosis not present

## 2017-11-08 DIAGNOSIS — E1140 Type 2 diabetes mellitus with diabetic neuropathy, unspecified: Secondary | ICD-10-CM | POA: Diagnosis not present

## 2017-11-14 DIAGNOSIS — Z125 Encounter for screening for malignant neoplasm of prostate: Secondary | ICD-10-CM | POA: Diagnosis not present

## 2017-11-14 DIAGNOSIS — Z0000 Encounter for general adult medical examination without abnormal findings: Secondary | ICD-10-CM | POA: Diagnosis not present

## 2017-12-12 DIAGNOSIS — E291 Testicular hypofunction: Secondary | ICD-10-CM | POA: Diagnosis not present

## 2017-12-12 DIAGNOSIS — E1140 Type 2 diabetes mellitus with diabetic neuropathy, unspecified: Secondary | ICD-10-CM | POA: Diagnosis not present

## 2017-12-12 DIAGNOSIS — R413 Other amnesia: Secondary | ICD-10-CM | POA: Diagnosis not present

## 2017-12-12 DIAGNOSIS — I639 Cerebral infarction, unspecified: Secondary | ICD-10-CM | POA: Diagnosis not present

## 2018-01-09 DIAGNOSIS — I639 Cerebral infarction, unspecified: Secondary | ICD-10-CM | POA: Diagnosis not present

## 2018-01-09 DIAGNOSIS — Z23 Encounter for immunization: Secondary | ICD-10-CM | POA: Diagnosis not present

## 2018-01-09 DIAGNOSIS — I10 Essential (primary) hypertension: Secondary | ICD-10-CM | POA: Diagnosis not present

## 2018-01-09 DIAGNOSIS — R413 Other amnesia: Secondary | ICD-10-CM | POA: Diagnosis not present

## 2018-01-09 DIAGNOSIS — E1165 Type 2 diabetes mellitus with hyperglycemia: Secondary | ICD-10-CM | POA: Diagnosis not present

## 2018-01-09 DIAGNOSIS — E1140 Type 2 diabetes mellitus with diabetic neuropathy, unspecified: Secondary | ICD-10-CM | POA: Diagnosis not present

## 2018-01-09 DIAGNOSIS — E291 Testicular hypofunction: Secondary | ICD-10-CM | POA: Diagnosis not present

## 2018-01-17 DIAGNOSIS — E291 Testicular hypofunction: Secondary | ICD-10-CM | POA: Diagnosis not present

## 2018-01-17 DIAGNOSIS — E1140 Type 2 diabetes mellitus with diabetic neuropathy, unspecified: Secondary | ICD-10-CM | POA: Diagnosis not present

## 2018-01-17 DIAGNOSIS — E118 Type 2 diabetes mellitus with unspecified complications: Secondary | ICD-10-CM | POA: Diagnosis not present

## 2018-01-17 DIAGNOSIS — I2699 Other pulmonary embolism without acute cor pulmonale: Secondary | ICD-10-CM | POA: Diagnosis not present

## 2018-01-22 DIAGNOSIS — E118 Type 2 diabetes mellitus with unspecified complications: Secondary | ICD-10-CM | POA: Diagnosis not present

## 2018-01-22 DIAGNOSIS — E291 Testicular hypofunction: Secondary | ICD-10-CM | POA: Diagnosis not present

## 2018-01-22 DIAGNOSIS — E1140 Type 2 diabetes mellitus with diabetic neuropathy, unspecified: Secondary | ICD-10-CM | POA: Diagnosis not present

## 2018-01-22 DIAGNOSIS — I2699 Other pulmonary embolism without acute cor pulmonale: Secondary | ICD-10-CM | POA: Diagnosis not present

## 2018-01-29 DIAGNOSIS — E291 Testicular hypofunction: Secondary | ICD-10-CM | POA: Diagnosis not present

## 2018-01-29 DIAGNOSIS — I2699 Other pulmonary embolism without acute cor pulmonale: Secondary | ICD-10-CM | POA: Diagnosis not present

## 2018-01-29 DIAGNOSIS — E118 Type 2 diabetes mellitus with unspecified complications: Secondary | ICD-10-CM | POA: Diagnosis not present

## 2018-01-29 DIAGNOSIS — E1140 Type 2 diabetes mellitus with diabetic neuropathy, unspecified: Secondary | ICD-10-CM | POA: Diagnosis not present

## 2018-02-09 DIAGNOSIS — E291 Testicular hypofunction: Secondary | ICD-10-CM | POA: Diagnosis not present

## 2018-02-09 DIAGNOSIS — E1140 Type 2 diabetes mellitus with diabetic neuropathy, unspecified: Secondary | ICD-10-CM | POA: Diagnosis not present

## 2018-02-09 DIAGNOSIS — E118 Type 2 diabetes mellitus with unspecified complications: Secondary | ICD-10-CM | POA: Diagnosis not present

## 2018-02-09 DIAGNOSIS — I2699 Other pulmonary embolism without acute cor pulmonale: Secondary | ICD-10-CM | POA: Diagnosis not present

## 2018-03-11 IMAGING — CT CT ABD-PELV W/ CM
2 of 5 series · 14 of 46 positions shown, 16 images · IV contrast (iopamidol)
Comparison: CT the abdomen and pelvis 08/22/2014.

CLINICAL DATA: 59-year-old male with abdominal pain 3 weeks ago,
and back pain for the past 2 weeks. Left-sided rib pain for 1 week.
No associated nausea, emesis or diarrhea.

EXAM:
CT ABDOMEN AND PELVIS WITH CONTRAST
TECHNIQUE: Multidetector CT imaging of the abdomen and pelvis was performed
using the standard protocol following bolus administration of
intravenous contrast.
CONTRAST:  100mL 5S2NHC-J88 IOPAMIDOL (5S2NHC-J88) INJECTION 61%

[Series 2: abd/pel with · axial · 0.88mm/px · z∈[+988,+1418]mm · 11 of 98 slices shown, 13 images]
[im 6/98  soft-tissue]
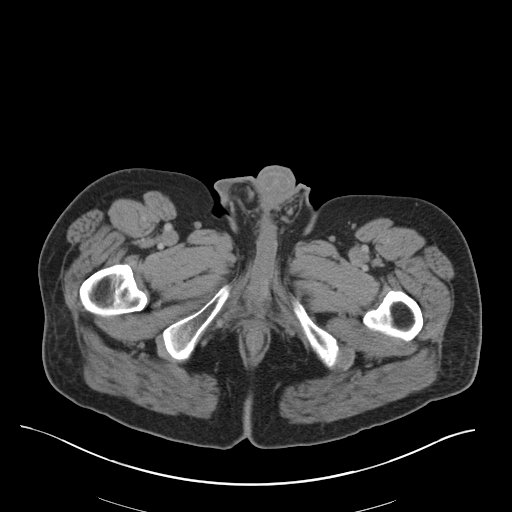
[im 6/98  bone]
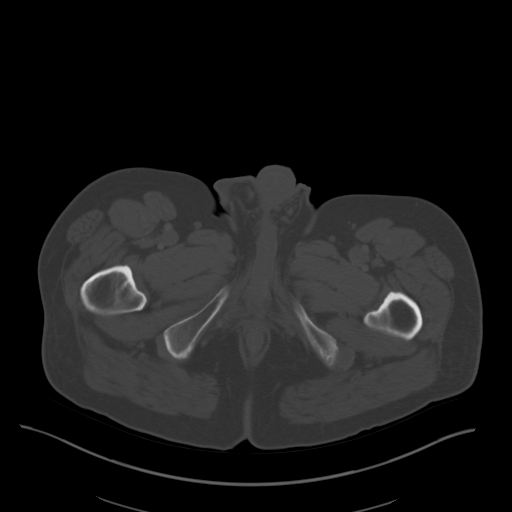
[im 16/98  soft-tissue]
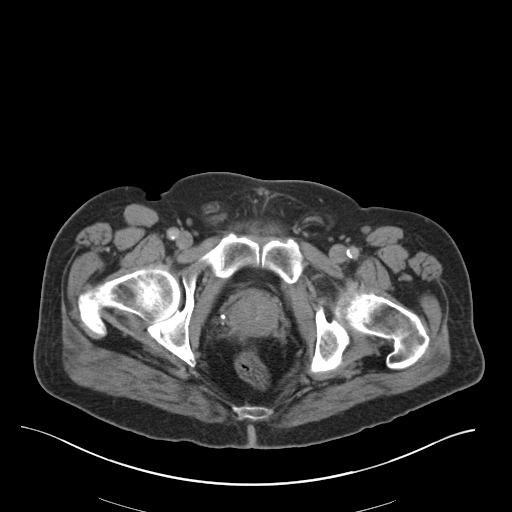
[im 26/98  soft-tissue]
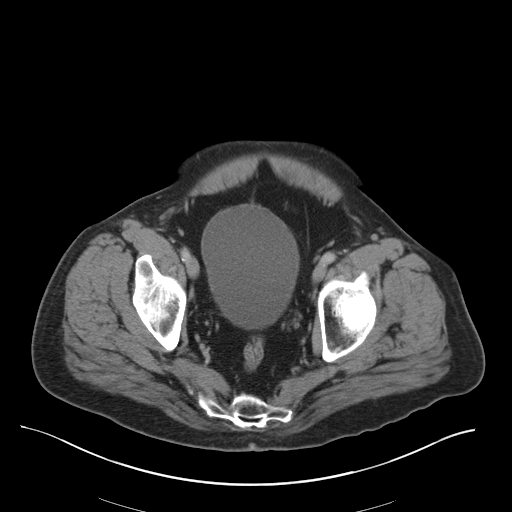
[im 31/98  soft-tissue]
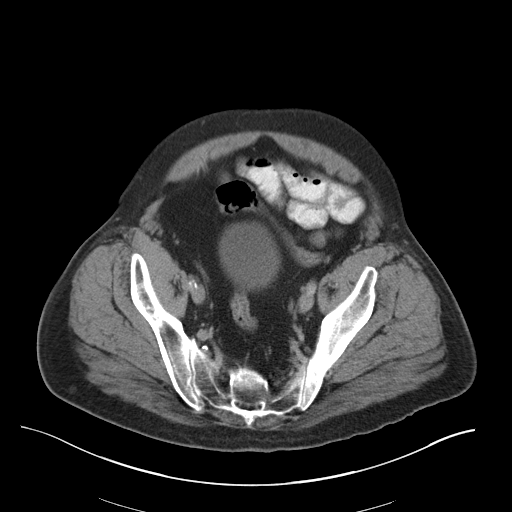
[im 41/98  soft-tissue]
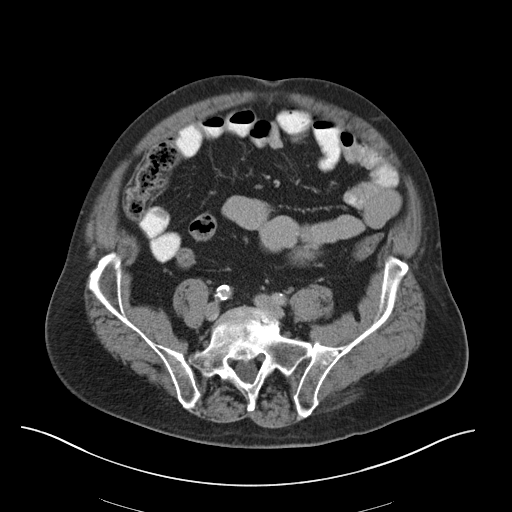
[im 52/98  soft-tissue]
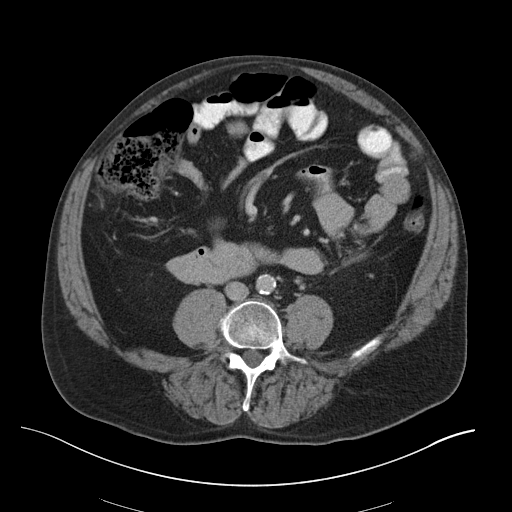
[im 57/98  soft-tissue]
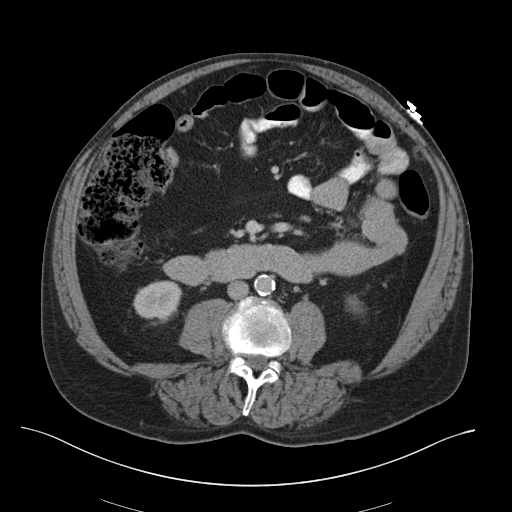
[im 67/98  soft-tissue]
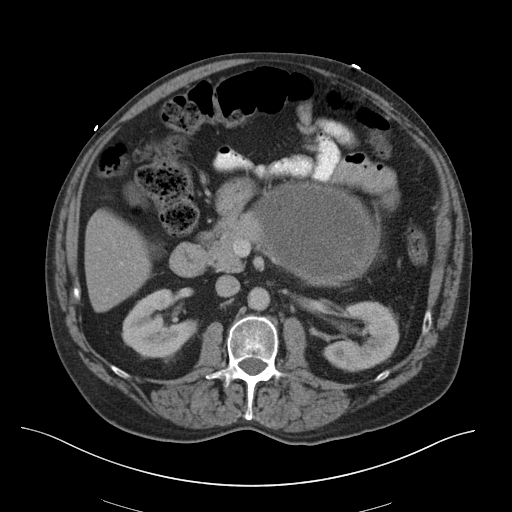
[im 72/98  soft-tissue]
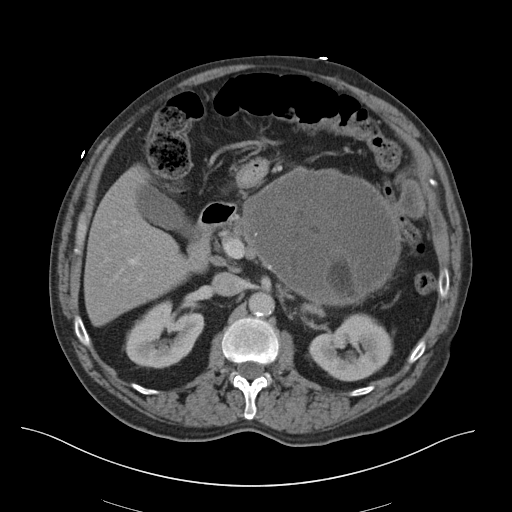
[im 72/98  bone]
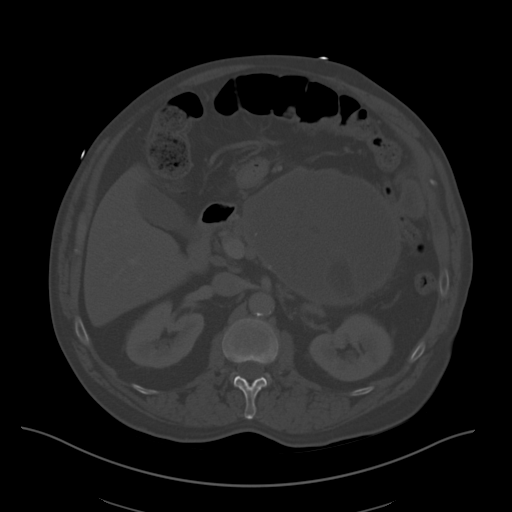
[im 82/98  soft-tissue]
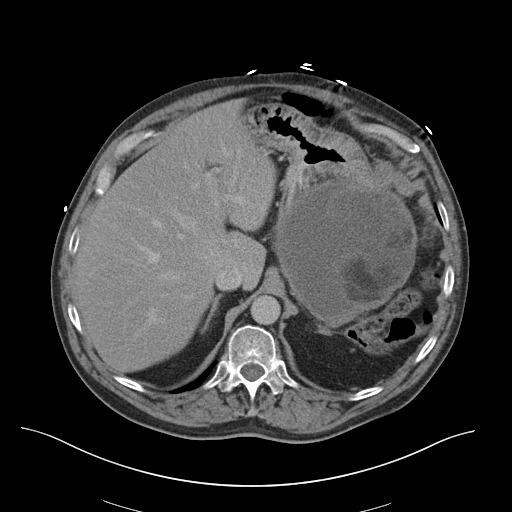
[im 92/98  soft-tissue]
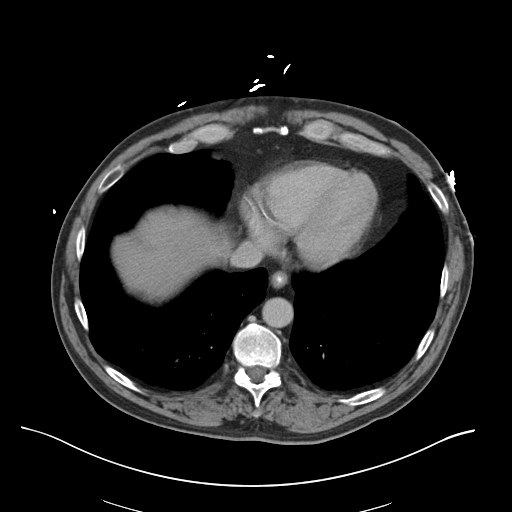

[Series 3: coronal a/|p · coronal · 0.74mm/px · 3 of 117 slices shown]
[im 39/117  soft-tissue]
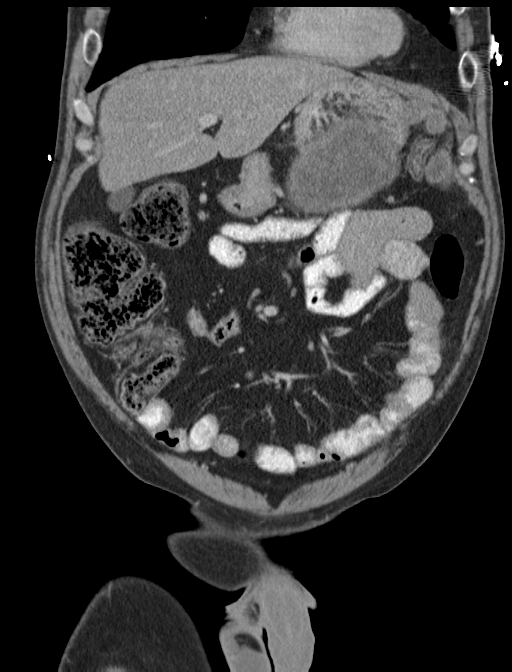
[im 52/117  soft-tissue]
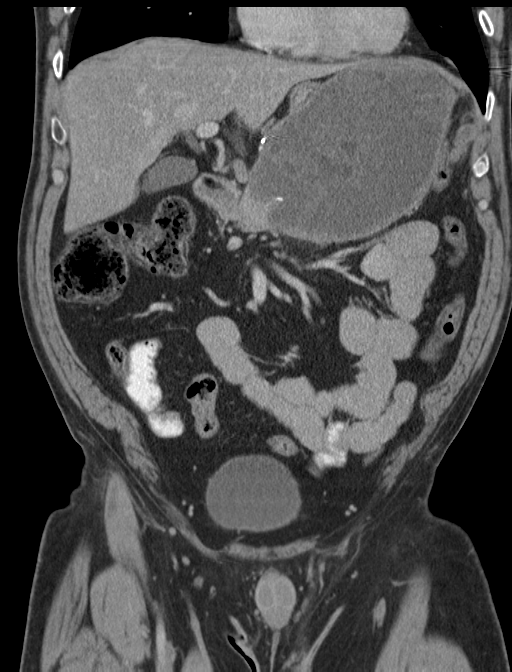
[im 65/117  soft-tissue]
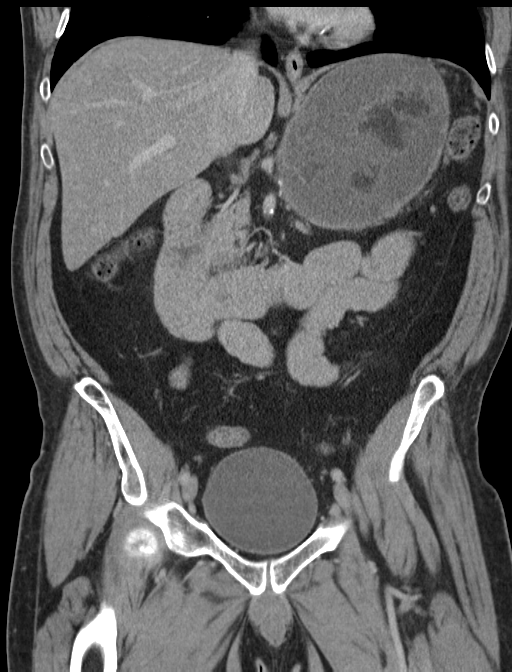

[14 of 46 positions shown; findings below may reference images not displayed]

FINDINGS: Lower chest: Atherosclerotic calcifications in the left circumflex
and right coronary artery territories.

Hepatobiliary: Diffuse low attenuation throughout the hepatic
parenchyma, compatible with a background of hepatic steatosis. No
cystic or solid hepatic lesions. No intra or extrahepatic biliary
ductal dilatation. Gallbladder is normal in appearance.

Pancreas: Compared to the prior examinations, the large cystic mass
in the distal body and tail of the pancreas has significantly
increased in size, currently measuring up to 12.8 x 14.0 x 13.5 cm
(axial image 21 of series 2 and coronal image 58 of series 3). This
mass continues to be heterogeneous in attenuation, with both
internal fluid and fatty attenuation areas. The lesion demonstrates
a thick peripheral wall which enhances, and there appears to be some
internal septations and complex architecture. The lesion extends
cephalad and is intimately associated with the undersurface of the
proximal body and fundus of the stomach. Within the head of the
pancreas the pancreatic duct does not appear dilated. No
peripancreatic inflammatory changes.

Spleen: Spleen is no longer visualized, presumably resected since
the prior examination from 08/22/2014.

Adrenals/Urinary Tract: Bilateral kidneys and bilateral adrenal
glands are normal in appearance.

Stomach/Bowel: Significant mass effect distorts the fundus and
proximal body of the stomach related to the large pancreatic mass,
as discussed above. Remaining portions of the stomach are otherwise
unremarkable in appearance. No pathologic dilatation of small bowel
or colon. Normal appendix.

Vascular/Lymphatic: Atherosclerosis throughout the abdominal and
pelvic vasculature, without evidence of aneurysm or dissection. No
lymphadenopathy noted in the abdomen or pelvis.

Reproductive: Prostate gland and seminal vesicles are unremarkable
in appearance.

Other: No significant volume of ascites.  No pneumoperitoneum.

Musculoskeletal: There are no aggressive appearing lytic or blastic
lesions noted in the visualized portions of the skeleton.
IMPRESSION: 1. Significant enlargement of the previously noted cystic mass in
the distal body and tail of the pancreas. This mass has
indeterminate imaging characteristics, but is concerning for
neoplasm. Conceivably, this could represent a chronic pancreatic
pseudocyst with saponification of fat internally, however, that is a
diagnosis of exclusion. Further evaluation with endoscopic
ultrasound and potential biopsy is strongly recommended at this
time.
2. Hepatic steatosis.
3. Spleen is no longer visualized, presumably surgically removed
since the prior examination.
4. Atherosclerosis, including at least 2 vessel coronary artery
disease. Please note that although the presence of coronary artery
calcium documents the presence of coronary artery disease, the
severity of this disease and any potential stenosis cannot be
assessed on this non-gated CT examination. Assessment for potential
risk factor modification, dietary therapy or pharmacologic therapy
may be warranted, if clinically indicated.
5. Normal appendix.
6. Additional incidental findings, as above.

## 2018-03-23 DIAGNOSIS — E291 Testicular hypofunction: Secondary | ICD-10-CM | POA: Diagnosis not present

## 2018-03-23 DIAGNOSIS — E118 Type 2 diabetes mellitus with unspecified complications: Secondary | ICD-10-CM | POA: Diagnosis not present

## 2018-03-23 DIAGNOSIS — I2699 Other pulmonary embolism without acute cor pulmonale: Secondary | ICD-10-CM | POA: Diagnosis not present

## 2018-03-23 DIAGNOSIS — E1140 Type 2 diabetes mellitus with diabetic neuropathy, unspecified: Secondary | ICD-10-CM | POA: Diagnosis not present

## 2018-04-20 DIAGNOSIS — J069 Acute upper respiratory infection, unspecified: Secondary | ICD-10-CM | POA: Diagnosis not present

## 2018-04-20 DIAGNOSIS — E291 Testicular hypofunction: Secondary | ICD-10-CM | POA: Diagnosis not present

## 2018-04-20 DIAGNOSIS — E1140 Type 2 diabetes mellitus with diabetic neuropathy, unspecified: Secondary | ICD-10-CM | POA: Diagnosis not present

## 2018-04-20 DIAGNOSIS — I10 Essential (primary) hypertension: Secondary | ICD-10-CM | POA: Diagnosis not present

## 2018-04-20 DIAGNOSIS — I2699 Other pulmonary embolism without acute cor pulmonale: Secondary | ICD-10-CM | POA: Diagnosis not present

## 2018-04-20 DIAGNOSIS — E118 Type 2 diabetes mellitus with unspecified complications: Secondary | ICD-10-CM | POA: Diagnosis not present

## 2018-04-20 DIAGNOSIS — E039 Hypothyroidism, unspecified: Secondary | ICD-10-CM | POA: Diagnosis not present

## 2018-05-04 DIAGNOSIS — R413 Other amnesia: Secondary | ICD-10-CM | POA: Diagnosis not present

## 2018-05-04 DIAGNOSIS — I2699 Other pulmonary embolism without acute cor pulmonale: Secondary | ICD-10-CM | POA: Diagnosis not present

## 2018-05-04 DIAGNOSIS — E118 Type 2 diabetes mellitus with unspecified complications: Secondary | ICD-10-CM | POA: Diagnosis not present

## 2018-05-04 DIAGNOSIS — E1140 Type 2 diabetes mellitus with diabetic neuropathy, unspecified: Secondary | ICD-10-CM | POA: Diagnosis not present

## 2018-05-14 DIAGNOSIS — E118 Type 2 diabetes mellitus with unspecified complications: Secondary | ICD-10-CM | POA: Diagnosis not present

## 2018-05-18 DIAGNOSIS — E291 Testicular hypofunction: Secondary | ICD-10-CM | POA: Diagnosis not present

## 2018-05-18 DIAGNOSIS — E1140 Type 2 diabetes mellitus with diabetic neuropathy, unspecified: Secondary | ICD-10-CM | POA: Diagnosis not present

## 2018-05-18 DIAGNOSIS — I2699 Other pulmonary embolism without acute cor pulmonale: Secondary | ICD-10-CM | POA: Diagnosis not present

## 2018-05-18 DIAGNOSIS — E118 Type 2 diabetes mellitus with unspecified complications: Secondary | ICD-10-CM | POA: Diagnosis not present

## 2018-06-01 DIAGNOSIS — E291 Testicular hypofunction: Secondary | ICD-10-CM | POA: Diagnosis not present

## 2018-06-01 DIAGNOSIS — E1140 Type 2 diabetes mellitus with diabetic neuropathy, unspecified: Secondary | ICD-10-CM | POA: Diagnosis not present

## 2018-06-01 DIAGNOSIS — E118 Type 2 diabetes mellitus with unspecified complications: Secondary | ICD-10-CM | POA: Diagnosis not present

## 2018-06-01 DIAGNOSIS — I2699 Other pulmonary embolism without acute cor pulmonale: Secondary | ICD-10-CM | POA: Diagnosis not present

## 2018-06-15 DIAGNOSIS — E1140 Type 2 diabetes mellitus with diabetic neuropathy, unspecified: Secondary | ICD-10-CM | POA: Diagnosis not present

## 2018-06-15 DIAGNOSIS — R0600 Dyspnea, unspecified: Secondary | ICD-10-CM | POA: Diagnosis not present

## 2018-06-15 DIAGNOSIS — E118 Type 2 diabetes mellitus with unspecified complications: Secondary | ICD-10-CM | POA: Diagnosis not present

## 2018-06-15 DIAGNOSIS — I2699 Other pulmonary embolism without acute cor pulmonale: Secondary | ICD-10-CM | POA: Diagnosis not present

## 2018-06-29 DIAGNOSIS — E1140 Type 2 diabetes mellitus with diabetic neuropathy, unspecified: Secondary | ICD-10-CM | POA: Diagnosis not present

## 2018-06-29 DIAGNOSIS — I2699 Other pulmonary embolism without acute cor pulmonale: Secondary | ICD-10-CM | POA: Diagnosis not present

## 2018-06-29 DIAGNOSIS — E118 Type 2 diabetes mellitus with unspecified complications: Secondary | ICD-10-CM | POA: Diagnosis not present

## 2018-06-29 DIAGNOSIS — R0600 Dyspnea, unspecified: Secondary | ICD-10-CM | POA: Diagnosis not present

## 2018-07-13 DIAGNOSIS — E1140 Type 2 diabetes mellitus with diabetic neuropathy, unspecified: Secondary | ICD-10-CM | POA: Diagnosis not present

## 2018-07-13 DIAGNOSIS — E118 Type 2 diabetes mellitus with unspecified complications: Secondary | ICD-10-CM | POA: Diagnosis not present

## 2018-07-13 DIAGNOSIS — R0600 Dyspnea, unspecified: Secondary | ICD-10-CM | POA: Diagnosis not present

## 2018-07-13 DIAGNOSIS — J309 Allergic rhinitis, unspecified: Secondary | ICD-10-CM | POA: Diagnosis not present

## 2018-08-10 DIAGNOSIS — J309 Allergic rhinitis, unspecified: Secondary | ICD-10-CM | POA: Diagnosis not present

## 2018-08-10 DIAGNOSIS — J208 Acute bronchitis due to other specified organisms: Secondary | ICD-10-CM | POA: Diagnosis not present

## 2018-08-10 DIAGNOSIS — R0600 Dyspnea, unspecified: Secondary | ICD-10-CM | POA: Diagnosis not present

## 2018-08-10 DIAGNOSIS — E1140 Type 2 diabetes mellitus with diabetic neuropathy, unspecified: Secondary | ICD-10-CM | POA: Diagnosis not present

## 2018-08-24 DIAGNOSIS — I10 Essential (primary) hypertension: Secondary | ICD-10-CM | POA: Diagnosis not present

## 2018-08-24 DIAGNOSIS — R0600 Dyspnea, unspecified: Secondary | ICD-10-CM | POA: Diagnosis not present

## 2018-08-24 DIAGNOSIS — E118 Type 2 diabetes mellitus with unspecified complications: Secondary | ICD-10-CM | POA: Diagnosis not present

## 2018-08-24 DIAGNOSIS — M25551 Pain in right hip: Secondary | ICD-10-CM | POA: Diagnosis not present

## 2018-08-24 DIAGNOSIS — E1140 Type 2 diabetes mellitus with diabetic neuropathy, unspecified: Secondary | ICD-10-CM | POA: Diagnosis not present

## 2018-08-24 DIAGNOSIS — J309 Allergic rhinitis, unspecified: Secondary | ICD-10-CM | POA: Diagnosis not present

## 2018-08-28 DIAGNOSIS — E1140 Type 2 diabetes mellitus with diabetic neuropathy, unspecified: Secondary | ICD-10-CM | POA: Diagnosis not present

## 2018-08-28 DIAGNOSIS — R0600 Dyspnea, unspecified: Secondary | ICD-10-CM | POA: Diagnosis not present

## 2018-08-28 DIAGNOSIS — M25551 Pain in right hip: Secondary | ICD-10-CM | POA: Diagnosis not present

## 2018-08-28 DIAGNOSIS — J309 Allergic rhinitis, unspecified: Secondary | ICD-10-CM | POA: Diagnosis not present

## 2018-09-07 DIAGNOSIS — J309 Allergic rhinitis, unspecified: Secondary | ICD-10-CM | POA: Diagnosis not present

## 2018-09-07 DIAGNOSIS — R0600 Dyspnea, unspecified: Secondary | ICD-10-CM | POA: Diagnosis not present

## 2018-09-07 DIAGNOSIS — E1140 Type 2 diabetes mellitus with diabetic neuropathy, unspecified: Secondary | ICD-10-CM | POA: Diagnosis not present

## 2018-09-07 DIAGNOSIS — H9193 Unspecified hearing loss, bilateral: Secondary | ICD-10-CM | POA: Diagnosis not present

## 2018-09-13 DIAGNOSIS — M25551 Pain in right hip: Secondary | ICD-10-CM | POA: Diagnosis not present

## 2018-09-13 DIAGNOSIS — I10 Essential (primary) hypertension: Secondary | ICD-10-CM | POA: Diagnosis not present

## 2018-09-13 DIAGNOSIS — R0600 Dyspnea, unspecified: Secondary | ICD-10-CM | POA: Diagnosis not present

## 2018-09-13 DIAGNOSIS — J309 Allergic rhinitis, unspecified: Secondary | ICD-10-CM | POA: Diagnosis not present

## 2018-09-13 DIAGNOSIS — R0602 Shortness of breath: Secondary | ICD-10-CM | POA: Diagnosis not present

## 2018-09-20 DIAGNOSIS — E1140 Type 2 diabetes mellitus with diabetic neuropathy, unspecified: Secondary | ICD-10-CM | POA: Diagnosis not present

## 2018-09-20 DIAGNOSIS — I10 Essential (primary) hypertension: Secondary | ICD-10-CM | POA: Diagnosis not present

## 2018-09-20 DIAGNOSIS — M25552 Pain in left hip: Secondary | ICD-10-CM | POA: Diagnosis not present

## 2018-09-20 DIAGNOSIS — J309 Allergic rhinitis, unspecified: Secondary | ICD-10-CM | POA: Diagnosis not present

## 2018-10-01 DIAGNOSIS — H903 Sensorineural hearing loss, bilateral: Secondary | ICD-10-CM | POA: Diagnosis not present

## 2018-10-04 DIAGNOSIS — J309 Allergic rhinitis, unspecified: Secondary | ICD-10-CM | POA: Diagnosis not present

## 2018-10-04 DIAGNOSIS — M545 Low back pain: Secondary | ICD-10-CM | POA: Diagnosis not present

## 2018-10-04 DIAGNOSIS — E1140 Type 2 diabetes mellitus with diabetic neuropathy, unspecified: Secondary | ICD-10-CM | POA: Diagnosis not present

## 2018-10-04 DIAGNOSIS — I10 Essential (primary) hypertension: Secondary | ICD-10-CM | POA: Diagnosis not present

## 2018-10-08 DIAGNOSIS — Z1211 Encounter for screening for malignant neoplasm of colon: Secondary | ICD-10-CM | POA: Diagnosis not present

## 2018-10-08 DIAGNOSIS — Z86010 Personal history of colonic polyps: Secondary | ICD-10-CM | POA: Diagnosis not present

## 2018-10-08 DIAGNOSIS — D124 Benign neoplasm of descending colon: Secondary | ICD-10-CM | POA: Diagnosis not present

## 2018-10-08 DIAGNOSIS — K635 Polyp of colon: Secondary | ICD-10-CM | POA: Diagnosis not present

## 2018-10-08 LAB — HM COLONOSCOPY

## 2018-11-01 DIAGNOSIS — J449 Chronic obstructive pulmonary disease, unspecified: Secondary | ICD-10-CM | POA: Diagnosis not present

## 2018-11-01 DIAGNOSIS — E1140 Type 2 diabetes mellitus with diabetic neuropathy, unspecified: Secondary | ICD-10-CM | POA: Diagnosis not present

## 2018-11-01 DIAGNOSIS — I10 Essential (primary) hypertension: Secondary | ICD-10-CM | POA: Diagnosis not present

## 2018-11-01 DIAGNOSIS — J309 Allergic rhinitis, unspecified: Secondary | ICD-10-CM | POA: Diagnosis not present

## 2018-11-15 DIAGNOSIS — I2699 Other pulmonary embolism without acute cor pulmonale: Secondary | ICD-10-CM | POA: Diagnosis not present

## 2018-11-15 DIAGNOSIS — I10 Essential (primary) hypertension: Secondary | ICD-10-CM | POA: Diagnosis not present

## 2018-11-15 DIAGNOSIS — J309 Allergic rhinitis, unspecified: Secondary | ICD-10-CM | POA: Diagnosis not present

## 2018-11-15 DIAGNOSIS — E118 Type 2 diabetes mellitus with unspecified complications: Secondary | ICD-10-CM | POA: Diagnosis not present

## 2018-11-15 DIAGNOSIS — E039 Hypothyroidism, unspecified: Secondary | ICD-10-CM | POA: Diagnosis not present

## 2018-11-15 DIAGNOSIS — E1140 Type 2 diabetes mellitus with diabetic neuropathy, unspecified: Secondary | ICD-10-CM | POA: Diagnosis not present

## 2018-11-21 DIAGNOSIS — E785 Hyperlipidemia, unspecified: Secondary | ICD-10-CM | POA: Diagnosis not present

## 2018-11-21 DIAGNOSIS — Z0000 Encounter for general adult medical examination without abnormal findings: Secondary | ICD-10-CM | POA: Diagnosis not present

## 2018-11-21 DIAGNOSIS — Z9181 History of falling: Secondary | ICD-10-CM | POA: Diagnosis not present

## 2018-11-21 DIAGNOSIS — Z125 Encounter for screening for malignant neoplasm of prostate: Secondary | ICD-10-CM | POA: Diagnosis not present

## 2018-11-21 DIAGNOSIS — Z1331 Encounter for screening for depression: Secondary | ICD-10-CM | POA: Diagnosis not present

## 2018-11-22 DIAGNOSIS — M545 Low back pain: Secondary | ICD-10-CM | POA: Diagnosis not present

## 2018-11-29 DIAGNOSIS — J309 Allergic rhinitis, unspecified: Secondary | ICD-10-CM | POA: Diagnosis not present

## 2018-11-29 DIAGNOSIS — G8929 Other chronic pain: Secondary | ICD-10-CM | POA: Diagnosis not present

## 2018-11-29 DIAGNOSIS — M545 Low back pain: Secondary | ICD-10-CM | POA: Diagnosis not present

## 2018-11-29 DIAGNOSIS — E1140 Type 2 diabetes mellitus with diabetic neuropathy, unspecified: Secondary | ICD-10-CM | POA: Diagnosis not present

## 2018-11-29 DIAGNOSIS — M48062 Spinal stenosis, lumbar region with neurogenic claudication: Secondary | ICD-10-CM | POA: Diagnosis not present

## 2018-12-04 DIAGNOSIS — M545 Low back pain: Secondary | ICD-10-CM | POA: Diagnosis not present

## 2018-12-08 DIAGNOSIS — R600 Localized edema: Secondary | ICD-10-CM | POA: Diagnosis not present

## 2018-12-08 DIAGNOSIS — J309 Allergic rhinitis, unspecified: Secondary | ICD-10-CM | POA: Diagnosis not present

## 2018-12-08 DIAGNOSIS — M545 Low back pain: Secondary | ICD-10-CM | POA: Diagnosis not present

## 2018-12-08 DIAGNOSIS — E1140 Type 2 diabetes mellitus with diabetic neuropathy, unspecified: Secondary | ICD-10-CM | POA: Diagnosis not present

## 2018-12-20 DIAGNOSIS — E1140 Type 2 diabetes mellitus with diabetic neuropathy, unspecified: Secondary | ICD-10-CM | POA: Diagnosis not present

## 2018-12-20 DIAGNOSIS — M545 Low back pain: Secondary | ICD-10-CM | POA: Diagnosis not present

## 2018-12-20 DIAGNOSIS — J309 Allergic rhinitis, unspecified: Secondary | ICD-10-CM | POA: Diagnosis not present

## 2018-12-20 DIAGNOSIS — G8929 Other chronic pain: Secondary | ICD-10-CM | POA: Diagnosis not present

## 2019-01-03 DIAGNOSIS — M545 Low back pain: Secondary | ICD-10-CM | POA: Diagnosis not present

## 2019-01-03 DIAGNOSIS — Z23 Encounter for immunization: Secondary | ICD-10-CM | POA: Diagnosis not present

## 2019-01-03 DIAGNOSIS — E1140 Type 2 diabetes mellitus with diabetic neuropathy, unspecified: Secondary | ICD-10-CM | POA: Diagnosis not present

## 2019-01-03 DIAGNOSIS — M25551 Pain in right hip: Secondary | ICD-10-CM | POA: Diagnosis not present

## 2019-01-03 DIAGNOSIS — J309 Allergic rhinitis, unspecified: Secondary | ICD-10-CM | POA: Diagnosis not present

## 2019-01-09 DIAGNOSIS — G8929 Other chronic pain: Secondary | ICD-10-CM | POA: Diagnosis not present

## 2019-01-09 DIAGNOSIS — M545 Low back pain: Secondary | ICD-10-CM | POA: Diagnosis not present

## 2019-01-09 DIAGNOSIS — M25552 Pain in left hip: Secondary | ICD-10-CM | POA: Diagnosis not present

## 2019-01-09 DIAGNOSIS — J9691 Respiratory failure, unspecified with hypoxia: Secondary | ICD-10-CM | POA: Diagnosis not present

## 2019-01-16 DIAGNOSIS — J9691 Respiratory failure, unspecified with hypoxia: Secondary | ICD-10-CM | POA: Diagnosis not present

## 2019-01-16 DIAGNOSIS — M545 Low back pain: Secondary | ICD-10-CM | POA: Diagnosis not present

## 2019-01-16 DIAGNOSIS — J309 Allergic rhinitis, unspecified: Secondary | ICD-10-CM | POA: Diagnosis not present

## 2019-01-16 DIAGNOSIS — E1140 Type 2 diabetes mellitus with diabetic neuropathy, unspecified: Secondary | ICD-10-CM | POA: Diagnosis not present

## 2019-01-22 DIAGNOSIS — J9691 Respiratory failure, unspecified with hypoxia: Secondary | ICD-10-CM | POA: Diagnosis not present

## 2019-01-22 DIAGNOSIS — M25551 Pain in right hip: Secondary | ICD-10-CM | POA: Diagnosis not present

## 2019-01-22 DIAGNOSIS — M545 Low back pain: Secondary | ICD-10-CM | POA: Diagnosis not present

## 2019-01-22 DIAGNOSIS — E1140 Type 2 diabetes mellitus with diabetic neuropathy, unspecified: Secondary | ICD-10-CM | POA: Diagnosis not present

## 2019-01-28 DIAGNOSIS — M545 Low back pain: Secondary | ICD-10-CM | POA: Diagnosis not present

## 2019-01-28 DIAGNOSIS — J309 Allergic rhinitis, unspecified: Secondary | ICD-10-CM | POA: Diagnosis not present

## 2019-01-28 DIAGNOSIS — E1140 Type 2 diabetes mellitus with diabetic neuropathy, unspecified: Secondary | ICD-10-CM | POA: Diagnosis not present

## 2019-01-28 DIAGNOSIS — J9611 Chronic respiratory failure with hypoxia: Secondary | ICD-10-CM | POA: Diagnosis not present

## 2019-02-25 DIAGNOSIS — I10 Essential (primary) hypertension: Secondary | ICD-10-CM | POA: Diagnosis not present

## 2019-02-25 DIAGNOSIS — E118 Type 2 diabetes mellitus with unspecified complications: Secondary | ICD-10-CM | POA: Diagnosis not present

## 2019-02-25 DIAGNOSIS — J9691 Respiratory failure, unspecified with hypoxia: Secondary | ICD-10-CM | POA: Diagnosis not present

## 2019-02-25 DIAGNOSIS — K5909 Other constipation: Secondary | ICD-10-CM | POA: Diagnosis not present

## 2019-02-25 DIAGNOSIS — M25551 Pain in right hip: Secondary | ICD-10-CM | POA: Diagnosis not present

## 2019-02-25 DIAGNOSIS — E1140 Type 2 diabetes mellitus with diabetic neuropathy, unspecified: Secondary | ICD-10-CM | POA: Diagnosis not present

## 2019-02-25 DIAGNOSIS — M545 Low back pain: Secondary | ICD-10-CM | POA: Diagnosis not present

## 2019-03-11 DIAGNOSIS — K5909 Other constipation: Secondary | ICD-10-CM | POA: Diagnosis not present

## 2019-03-11 DIAGNOSIS — E118 Type 2 diabetes mellitus with unspecified complications: Secondary | ICD-10-CM | POA: Diagnosis not present

## 2019-03-11 DIAGNOSIS — I10 Essential (primary) hypertension: Secondary | ICD-10-CM | POA: Diagnosis not present

## 2019-03-11 DIAGNOSIS — M25552 Pain in left hip: Secondary | ICD-10-CM | POA: Diagnosis not present

## 2019-03-11 DIAGNOSIS — M545 Low back pain: Secondary | ICD-10-CM | POA: Diagnosis not present

## 2019-03-25 DIAGNOSIS — K5909 Other constipation: Secondary | ICD-10-CM | POA: Diagnosis not present

## 2019-03-25 DIAGNOSIS — E118 Type 2 diabetes mellitus with unspecified complications: Secondary | ICD-10-CM | POA: Diagnosis not present

## 2019-03-25 DIAGNOSIS — M545 Low back pain: Secondary | ICD-10-CM | POA: Diagnosis not present

## 2019-03-25 DIAGNOSIS — I10 Essential (primary) hypertension: Secondary | ICD-10-CM | POA: Diagnosis not present

## 2019-04-22 DIAGNOSIS — J449 Chronic obstructive pulmonary disease, unspecified: Secondary | ICD-10-CM | POA: Diagnosis not present

## 2019-04-22 DIAGNOSIS — E118 Type 2 diabetes mellitus with unspecified complications: Secondary | ICD-10-CM | POA: Diagnosis not present

## 2019-04-22 DIAGNOSIS — M545 Low back pain: Secondary | ICD-10-CM | POA: Diagnosis not present

## 2019-04-22 DIAGNOSIS — I10 Essential (primary) hypertension: Secondary | ICD-10-CM | POA: Diagnosis not present

## 2019-05-06 DIAGNOSIS — J449 Chronic obstructive pulmonary disease, unspecified: Secondary | ICD-10-CM | POA: Diagnosis not present

## 2019-05-06 DIAGNOSIS — E1140 Type 2 diabetes mellitus with diabetic neuropathy, unspecified: Secondary | ICD-10-CM | POA: Diagnosis not present

## 2019-05-06 DIAGNOSIS — M545 Low back pain: Secondary | ICD-10-CM | POA: Diagnosis not present

## 2019-05-06 DIAGNOSIS — I10 Essential (primary) hypertension: Secondary | ICD-10-CM | POA: Diagnosis not present

## 2019-05-24 DIAGNOSIS — Z20822 Contact with and (suspected) exposure to covid-19: Secondary | ICD-10-CM | POA: Diagnosis not present

## 2019-05-24 DIAGNOSIS — I10 Essential (primary) hypertension: Secondary | ICD-10-CM | POA: Diagnosis not present

## 2019-05-24 DIAGNOSIS — J449 Chronic obstructive pulmonary disease, unspecified: Secondary | ICD-10-CM | POA: Diagnosis not present

## 2019-05-24 DIAGNOSIS — E1140 Type 2 diabetes mellitus with diabetic neuropathy, unspecified: Secondary | ICD-10-CM | POA: Diagnosis not present

## 2019-05-27 DIAGNOSIS — E1140 Type 2 diabetes mellitus with diabetic neuropathy, unspecified: Secondary | ICD-10-CM | POA: Diagnosis not present

## 2019-05-27 DIAGNOSIS — J208 Acute bronchitis due to other specified organisms: Secondary | ICD-10-CM | POA: Diagnosis not present

## 2019-05-27 DIAGNOSIS — I10 Essential (primary) hypertension: Secondary | ICD-10-CM | POA: Diagnosis not present

## 2019-05-27 DIAGNOSIS — U071 COVID-19: Secondary | ICD-10-CM | POA: Diagnosis not present

## 2019-05-31 DIAGNOSIS — U071 COVID-19: Secondary | ICD-10-CM | POA: Diagnosis not present

## 2019-05-31 DIAGNOSIS — J208 Acute bronchitis due to other specified organisms: Secondary | ICD-10-CM | POA: Diagnosis not present

## 2019-05-31 DIAGNOSIS — R0600 Dyspnea, unspecified: Secondary | ICD-10-CM | POA: Diagnosis not present

## 2019-05-31 DIAGNOSIS — I10 Essential (primary) hypertension: Secondary | ICD-10-CM | POA: Diagnosis not present

## 2019-06-17 DIAGNOSIS — J449 Chronic obstructive pulmonary disease, unspecified: Secondary | ICD-10-CM | POA: Diagnosis not present

## 2019-06-17 DIAGNOSIS — I10 Essential (primary) hypertension: Secondary | ICD-10-CM | POA: Diagnosis not present

## 2019-06-17 DIAGNOSIS — E1140 Type 2 diabetes mellitus with diabetic neuropathy, unspecified: Secondary | ICD-10-CM | POA: Diagnosis not present

## 2019-06-17 DIAGNOSIS — E118 Type 2 diabetes mellitus with unspecified complications: Secondary | ICD-10-CM | POA: Diagnosis not present

## 2019-07-01 DIAGNOSIS — E1140 Type 2 diabetes mellitus with diabetic neuropathy, unspecified: Secondary | ICD-10-CM | POA: Diagnosis not present

## 2019-07-01 DIAGNOSIS — I10 Essential (primary) hypertension: Secondary | ICD-10-CM | POA: Diagnosis not present

## 2019-07-01 DIAGNOSIS — D692 Other nonthrombocytopenic purpura: Secondary | ICD-10-CM | POA: Diagnosis not present

## 2019-07-01 DIAGNOSIS — E118 Type 2 diabetes mellitus with unspecified complications: Secondary | ICD-10-CM | POA: Diagnosis not present

## 2019-07-01 DIAGNOSIS — E039 Hypothyroidism, unspecified: Secondary | ICD-10-CM | POA: Diagnosis not present

## 2019-07-01 DIAGNOSIS — J449 Chronic obstructive pulmonary disease, unspecified: Secondary | ICD-10-CM | POA: Diagnosis not present

## 2019-07-24 DIAGNOSIS — J449 Chronic obstructive pulmonary disease, unspecified: Secondary | ICD-10-CM | POA: Diagnosis not present

## 2019-07-24 DIAGNOSIS — I10 Essential (primary) hypertension: Secondary | ICD-10-CM | POA: Diagnosis not present

## 2019-07-24 DIAGNOSIS — E1140 Type 2 diabetes mellitus with diabetic neuropathy, unspecified: Secondary | ICD-10-CM | POA: Diagnosis not present

## 2019-07-24 DIAGNOSIS — D692 Other nonthrombocytopenic purpura: Secondary | ICD-10-CM | POA: Diagnosis not present

## 2019-08-09 DIAGNOSIS — J9691 Respiratory failure, unspecified with hypoxia: Secondary | ICD-10-CM | POA: Diagnosis not present

## 2019-08-09 DIAGNOSIS — K5909 Other constipation: Secondary | ICD-10-CM | POA: Diagnosis not present

## 2019-08-09 DIAGNOSIS — D692 Other nonthrombocytopenic purpura: Secondary | ICD-10-CM | POA: Diagnosis not present

## 2019-08-09 DIAGNOSIS — E1140 Type 2 diabetes mellitus with diabetic neuropathy, unspecified: Secondary | ICD-10-CM | POA: Diagnosis not present

## 2019-08-23 DIAGNOSIS — E1140 Type 2 diabetes mellitus with diabetic neuropathy, unspecified: Secondary | ICD-10-CM | POA: Diagnosis not present

## 2019-08-23 DIAGNOSIS — K5909 Other constipation: Secondary | ICD-10-CM | POA: Diagnosis not present

## 2019-08-23 DIAGNOSIS — D692 Other nonthrombocytopenic purpura: Secondary | ICD-10-CM | POA: Diagnosis not present

## 2019-08-23 DIAGNOSIS — J9691 Respiratory failure, unspecified with hypoxia: Secondary | ICD-10-CM | POA: Diagnosis not present

## 2019-09-06 DIAGNOSIS — J449 Chronic obstructive pulmonary disease, unspecified: Secondary | ICD-10-CM | POA: Diagnosis not present

## 2019-09-06 DIAGNOSIS — E118 Type 2 diabetes mellitus with unspecified complications: Secondary | ICD-10-CM | POA: Diagnosis not present

## 2019-09-06 DIAGNOSIS — M25552 Pain in left hip: Secondary | ICD-10-CM | POA: Diagnosis not present

## 2019-09-06 DIAGNOSIS — E1140 Type 2 diabetes mellitus with diabetic neuropathy, unspecified: Secondary | ICD-10-CM | POA: Diagnosis not present

## 2019-09-20 DIAGNOSIS — J9691 Respiratory failure, unspecified with hypoxia: Secondary | ICD-10-CM | POA: Diagnosis not present

## 2019-09-20 DIAGNOSIS — J449 Chronic obstructive pulmonary disease, unspecified: Secondary | ICD-10-CM | POA: Diagnosis not present

## 2019-09-20 DIAGNOSIS — E118 Type 2 diabetes mellitus with unspecified complications: Secondary | ICD-10-CM | POA: Diagnosis not present

## 2019-09-20 DIAGNOSIS — E1140 Type 2 diabetes mellitus with diabetic neuropathy, unspecified: Secondary | ICD-10-CM | POA: Diagnosis not present

## 2019-10-25 DIAGNOSIS — D692 Other nonthrombocytopenic purpura: Secondary | ICD-10-CM | POA: Diagnosis not present

## 2019-10-25 DIAGNOSIS — I10 Essential (primary) hypertension: Secondary | ICD-10-CM | POA: Diagnosis not present

## 2019-10-25 DIAGNOSIS — J449 Chronic obstructive pulmonary disease, unspecified: Secondary | ICD-10-CM | POA: Diagnosis not present

## 2019-10-25 DIAGNOSIS — E1140 Type 2 diabetes mellitus with diabetic neuropathy, unspecified: Secondary | ICD-10-CM | POA: Diagnosis not present

## 2019-10-25 DIAGNOSIS — K5909 Other constipation: Secondary | ICD-10-CM | POA: Diagnosis not present

## 2019-10-25 DIAGNOSIS — E291 Testicular hypofunction: Secondary | ICD-10-CM | POA: Diagnosis not present

## 2019-10-25 DIAGNOSIS — I2699 Other pulmonary embolism without acute cor pulmonale: Secondary | ICD-10-CM | POA: Diagnosis not present

## 2019-10-25 DIAGNOSIS — J309 Allergic rhinitis, unspecified: Secondary | ICD-10-CM | POA: Diagnosis not present

## 2019-10-25 DIAGNOSIS — E118 Type 2 diabetes mellitus with unspecified complications: Secondary | ICD-10-CM | POA: Diagnosis not present

## 2019-10-25 DIAGNOSIS — R1013 Epigastric pain: Secondary | ICD-10-CM | POA: Diagnosis not present

## 2019-10-25 DIAGNOSIS — R413 Other amnesia: Secondary | ICD-10-CM | POA: Diagnosis not present

## 2019-10-25 DIAGNOSIS — J9691 Respiratory failure, unspecified with hypoxia: Secondary | ICD-10-CM | POA: Diagnosis not present

## 2019-10-25 DIAGNOSIS — I679 Cerebrovascular disease, unspecified: Secondary | ICD-10-CM | POA: Diagnosis not present

## 2019-11-08 DIAGNOSIS — E1140 Type 2 diabetes mellitus with diabetic neuropathy, unspecified: Secondary | ICD-10-CM | POA: Diagnosis not present

## 2019-11-08 DIAGNOSIS — R1013 Epigastric pain: Secondary | ICD-10-CM | POA: Diagnosis not present

## 2019-11-08 DIAGNOSIS — J309 Allergic rhinitis, unspecified: Secondary | ICD-10-CM | POA: Diagnosis not present

## 2019-11-08 DIAGNOSIS — K219 Gastro-esophageal reflux disease without esophagitis: Secondary | ICD-10-CM | POA: Diagnosis not present

## 2019-11-08 DIAGNOSIS — K5909 Other constipation: Secondary | ICD-10-CM | POA: Diagnosis not present

## 2019-11-08 DIAGNOSIS — I2699 Other pulmonary embolism without acute cor pulmonale: Secondary | ICD-10-CM | POA: Diagnosis not present

## 2019-11-08 DIAGNOSIS — J9691 Respiratory failure, unspecified with hypoxia: Secondary | ICD-10-CM | POA: Diagnosis not present

## 2019-11-08 DIAGNOSIS — I679 Cerebrovascular disease, unspecified: Secondary | ICD-10-CM | POA: Diagnosis not present

## 2019-11-08 DIAGNOSIS — R413 Other amnesia: Secondary | ICD-10-CM | POA: Diagnosis not present

## 2019-11-08 DIAGNOSIS — E291 Testicular hypofunction: Secondary | ICD-10-CM | POA: Diagnosis not present

## 2019-11-08 DIAGNOSIS — D692 Other nonthrombocytopenic purpura: Secondary | ICD-10-CM | POA: Diagnosis not present

## 2019-11-08 DIAGNOSIS — K863 Pseudocyst of pancreas: Secondary | ICD-10-CM | POA: Diagnosis not present

## 2019-11-22 DIAGNOSIS — E118 Type 2 diabetes mellitus with unspecified complications: Secondary | ICD-10-CM | POA: Diagnosis not present

## 2019-11-22 DIAGNOSIS — K5909 Other constipation: Secondary | ICD-10-CM | POA: Diagnosis not present

## 2019-11-22 DIAGNOSIS — E291 Testicular hypofunction: Secondary | ICD-10-CM | POA: Diagnosis not present

## 2019-11-22 DIAGNOSIS — J9691 Respiratory failure, unspecified with hypoxia: Secondary | ICD-10-CM | POA: Diagnosis not present

## 2019-11-22 DIAGNOSIS — R413 Other amnesia: Secondary | ICD-10-CM | POA: Diagnosis not present

## 2019-11-22 DIAGNOSIS — J309 Allergic rhinitis, unspecified: Secondary | ICD-10-CM | POA: Diagnosis not present

## 2019-11-22 DIAGNOSIS — D692 Other nonthrombocytopenic purpura: Secondary | ICD-10-CM | POA: Diagnosis not present

## 2019-11-22 DIAGNOSIS — I10 Essential (primary) hypertension: Secondary | ICD-10-CM | POA: Diagnosis not present

## 2019-11-22 DIAGNOSIS — I2699 Other pulmonary embolism without acute cor pulmonale: Secondary | ICD-10-CM | POA: Diagnosis not present

## 2019-11-22 DIAGNOSIS — J449 Chronic obstructive pulmonary disease, unspecified: Secondary | ICD-10-CM | POA: Diagnosis not present

## 2019-11-22 DIAGNOSIS — I679 Cerebrovascular disease, unspecified: Secondary | ICD-10-CM | POA: Diagnosis not present

## 2019-11-22 DIAGNOSIS — E1140 Type 2 diabetes mellitus with diabetic neuropathy, unspecified: Secondary | ICD-10-CM | POA: Diagnosis not present

## 2019-11-26 DIAGNOSIS — Z0000 Encounter for general adult medical examination without abnormal findings: Secondary | ICD-10-CM | POA: Diagnosis not present

## 2019-11-26 DIAGNOSIS — Z1331 Encounter for screening for depression: Secondary | ICD-10-CM | POA: Diagnosis not present

## 2019-11-26 DIAGNOSIS — Z9181 History of falling: Secondary | ICD-10-CM | POA: Diagnosis not present

## 2019-11-26 DIAGNOSIS — E785 Hyperlipidemia, unspecified: Secondary | ICD-10-CM | POA: Diagnosis not present

## 2019-12-12 DIAGNOSIS — I679 Cerebrovascular disease, unspecified: Secondary | ICD-10-CM | POA: Diagnosis not present

## 2019-12-12 DIAGNOSIS — K5909 Other constipation: Secondary | ICD-10-CM | POA: Diagnosis not present

## 2019-12-12 DIAGNOSIS — E118 Type 2 diabetes mellitus with unspecified complications: Secondary | ICD-10-CM | POA: Diagnosis not present

## 2019-12-12 DIAGNOSIS — J9691 Respiratory failure, unspecified with hypoxia: Secondary | ICD-10-CM | POA: Diagnosis not present

## 2019-12-12 DIAGNOSIS — J449 Chronic obstructive pulmonary disease, unspecified: Secondary | ICD-10-CM | POA: Diagnosis not present

## 2019-12-12 DIAGNOSIS — D692 Other nonthrombocytopenic purpura: Secondary | ICD-10-CM | POA: Diagnosis not present

## 2019-12-12 DIAGNOSIS — R413 Other amnesia: Secondary | ICD-10-CM | POA: Diagnosis not present

## 2019-12-12 DIAGNOSIS — E1140 Type 2 diabetes mellitus with diabetic neuropathy, unspecified: Secondary | ICD-10-CM | POA: Diagnosis not present

## 2019-12-12 DIAGNOSIS — E291 Testicular hypofunction: Secondary | ICD-10-CM | POA: Diagnosis not present

## 2019-12-12 DIAGNOSIS — I10 Essential (primary) hypertension: Secondary | ICD-10-CM | POA: Diagnosis not present

## 2019-12-12 DIAGNOSIS — I2699 Other pulmonary embolism without acute cor pulmonale: Secondary | ICD-10-CM | POA: Diagnosis not present

## 2019-12-12 DIAGNOSIS — J309 Allergic rhinitis, unspecified: Secondary | ICD-10-CM | POA: Diagnosis not present

## 2019-12-13 DIAGNOSIS — I700 Atherosclerosis of aorta: Secondary | ICD-10-CM | POA: Diagnosis not present

## 2019-12-13 DIAGNOSIS — R000 Tachycardia, unspecified: Secondary | ICD-10-CM | POA: Diagnosis not present

## 2019-12-13 DIAGNOSIS — S32011A Stable burst fracture of first lumbar vertebra, initial encounter for closed fracture: Secondary | ICD-10-CM | POA: Diagnosis not present

## 2019-12-13 DIAGNOSIS — M545 Low back pain: Secondary | ICD-10-CM | POA: Diagnosis not present

## 2019-12-13 DIAGNOSIS — S32012A Unstable burst fracture of first lumbar vertebra, initial encounter for closed fracture: Secondary | ICD-10-CM | POA: Diagnosis not present

## 2019-12-13 DIAGNOSIS — R52 Pain, unspecified: Secondary | ICD-10-CM | POA: Diagnosis not present

## 2019-12-13 DIAGNOSIS — M5489 Other dorsalgia: Secondary | ICD-10-CM | POA: Diagnosis not present

## 2019-12-14 DIAGNOSIS — K567 Ileus, unspecified: Secondary | ICD-10-CM | POA: Diagnosis not present

## 2019-12-14 DIAGNOSIS — T402X5A Adverse effect of other opioids, initial encounter: Secondary | ICD-10-CM | POA: Diagnosis not present

## 2019-12-14 DIAGNOSIS — S32012A Unstable burst fracture of first lumbar vertebra, initial encounter for closed fracture: Secondary | ICD-10-CM | POA: Diagnosis not present

## 2019-12-14 DIAGNOSIS — S32011A Stable burst fracture of first lumbar vertebra, initial encounter for closed fracture: Secondary | ICD-10-CM | POA: Diagnosis not present

## 2019-12-14 DIAGNOSIS — I10 Essential (primary) hypertension: Secondary | ICD-10-CM | POA: Diagnosis not present

## 2019-12-14 DIAGNOSIS — I4891 Unspecified atrial fibrillation: Secondary | ICD-10-CM | POA: Diagnosis not present

## 2019-12-14 DIAGNOSIS — J95822 Acute and chronic postprocedural respiratory failure: Secondary | ICD-10-CM | POA: Diagnosis not present

## 2019-12-14 DIAGNOSIS — E039 Hypothyroidism, unspecified: Secondary | ICD-10-CM | POA: Diagnosis present

## 2019-12-14 DIAGNOSIS — I480 Paroxysmal atrial fibrillation: Secondary | ICD-10-CM | POA: Diagnosis present

## 2019-12-14 DIAGNOSIS — K219 Gastro-esophageal reflux disease without esophagitis: Secondary | ICD-10-CM | POA: Diagnosis present

## 2019-12-14 DIAGNOSIS — D509 Iron deficiency anemia, unspecified: Secondary | ICD-10-CM | POA: Diagnosis present

## 2019-12-14 DIAGNOSIS — E871 Hypo-osmolality and hyponatremia: Secondary | ICD-10-CM | POA: Diagnosis present

## 2019-12-14 DIAGNOSIS — Z79899 Other long term (current) drug therapy: Secondary | ICD-10-CM | POA: Diagnosis not present

## 2019-12-14 DIAGNOSIS — J9622 Acute and chronic respiratory failure with hypercapnia: Secondary | ICD-10-CM | POA: Diagnosis not present

## 2019-12-14 DIAGNOSIS — I700 Atherosclerosis of aorta: Secondary | ICD-10-CM | POA: Diagnosis not present

## 2019-12-14 DIAGNOSIS — J441 Chronic obstructive pulmonary disease with (acute) exacerbation: Secondary | ICD-10-CM | POA: Diagnosis not present

## 2019-12-14 DIAGNOSIS — I2510 Atherosclerotic heart disease of native coronary artery without angina pectoris: Secondary | ICD-10-CM | POA: Diagnosis present

## 2019-12-14 DIAGNOSIS — F17210 Nicotine dependence, cigarettes, uncomplicated: Secondary | ICD-10-CM | POA: Diagnosis present

## 2019-12-14 DIAGNOSIS — M5126 Other intervertebral disc displacement, lumbar region: Secondary | ICD-10-CM | POA: Diagnosis not present

## 2019-12-14 DIAGNOSIS — S32010A Wedge compression fracture of first lumbar vertebra, initial encounter for closed fracture: Secondary | ICD-10-CM | POA: Diagnosis not present

## 2019-12-14 DIAGNOSIS — M545 Low back pain: Secondary | ICD-10-CM | POA: Diagnosis not present

## 2019-12-14 DIAGNOSIS — R5381 Other malaise: Secondary | ICD-10-CM | POA: Diagnosis present

## 2019-12-14 DIAGNOSIS — Z794 Long term (current) use of insulin: Secondary | ICD-10-CM | POA: Diagnosis not present

## 2019-12-14 DIAGNOSIS — E875 Hyperkalemia: Secondary | ICD-10-CM | POA: Diagnosis present

## 2019-12-14 DIAGNOSIS — E119 Type 2 diabetes mellitus without complications: Secondary | ICD-10-CM | POA: Diagnosis present

## 2019-12-14 DIAGNOSIS — M5136 Other intervertebral disc degeneration, lumbar region: Secondary | ICD-10-CM | POA: Diagnosis not present

## 2019-12-14 DIAGNOSIS — S32019A Unspecified fracture of first lumbar vertebra, initial encounter for closed fracture: Secondary | ICD-10-CM | POA: Diagnosis not present

## 2019-12-14 DIAGNOSIS — G9341 Metabolic encephalopathy: Secondary | ICD-10-CM | POA: Diagnosis not present

## 2019-12-14 DIAGNOSIS — D72829 Elevated white blood cell count, unspecified: Secondary | ICD-10-CM | POA: Diagnosis present

## 2019-12-14 DIAGNOSIS — E559 Vitamin D deficiency, unspecified: Secondary | ICD-10-CM | POA: Diagnosis present

## 2019-12-14 DIAGNOSIS — Z9981 Dependence on supplemental oxygen: Secondary | ICD-10-CM | POA: Diagnosis not present

## 2019-12-14 DIAGNOSIS — M47817 Spondylosis without myelopathy or radiculopathy, lumbosacral region: Secondary | ICD-10-CM | POA: Diagnosis not present

## 2019-12-14 DIAGNOSIS — R109 Unspecified abdominal pain: Secondary | ICD-10-CM | POA: Diagnosis not present

## 2019-12-14 DIAGNOSIS — K5903 Drug induced constipation: Secondary | ICD-10-CM | POA: Diagnosis not present

## 2019-12-14 DIAGNOSIS — S32001A Stable burst fracture of unspecified lumbar vertebra, initial encounter for closed fracture: Secondary | ICD-10-CM | POA: Diagnosis not present

## 2019-12-14 DIAGNOSIS — R531 Weakness: Secondary | ICD-10-CM | POA: Diagnosis not present

## 2019-12-14 DIAGNOSIS — Z716 Tobacco abuse counseling: Secondary | ICD-10-CM | POA: Diagnosis not present

## 2019-12-19 DIAGNOSIS — E559 Vitamin D deficiency, unspecified: Secondary | ICD-10-CM | POA: Diagnosis not present

## 2019-12-19 DIAGNOSIS — R531 Weakness: Secondary | ICD-10-CM | POA: Diagnosis not present

## 2019-12-19 DIAGNOSIS — E119 Type 2 diabetes mellitus without complications: Secondary | ICD-10-CM | POA: Diagnosis not present

## 2019-12-19 DIAGNOSIS — E039 Hypothyroidism, unspecified: Secondary | ICD-10-CM | POA: Diagnosis not present

## 2019-12-19 DIAGNOSIS — S32001D Stable burst fracture of unspecified lumbar vertebra, subsequent encounter for fracture with routine healing: Secondary | ICD-10-CM | POA: Diagnosis not present

## 2019-12-19 DIAGNOSIS — Z79899 Other long term (current) drug therapy: Secondary | ICD-10-CM | POA: Diagnosis not present

## 2019-12-19 DIAGNOSIS — Z79891 Long term (current) use of opiate analgesic: Secondary | ICD-10-CM | POA: Diagnosis not present

## 2019-12-19 DIAGNOSIS — J449 Chronic obstructive pulmonary disease, unspecified: Secondary | ICD-10-CM | POA: Diagnosis not present

## 2019-12-19 DIAGNOSIS — R5381 Other malaise: Secondary | ICD-10-CM | POA: Diagnosis not present

## 2019-12-19 DIAGNOSIS — S32001A Stable burst fracture of unspecified lumbar vertebra, initial encounter for closed fracture: Secondary | ICD-10-CM | POA: Diagnosis not present

## 2019-12-19 DIAGNOSIS — X500XXD Overexertion from strenuous movement or load, subsequent encounter: Secondary | ICD-10-CM | POA: Diagnosis not present

## 2019-12-19 DIAGNOSIS — Z9981 Dependence on supplemental oxygen: Secondary | ICD-10-CM | POA: Diagnosis not present

## 2019-12-19 DIAGNOSIS — I480 Paroxysmal atrial fibrillation: Secondary | ICD-10-CM | POA: Diagnosis not present

## 2019-12-19 DIAGNOSIS — K219 Gastro-esophageal reflux disease without esophagitis: Secondary | ICD-10-CM | POA: Diagnosis not present

## 2019-12-19 DIAGNOSIS — D509 Iron deficiency anemia, unspecified: Secondary | ICD-10-CM | POA: Diagnosis not present

## 2019-12-19 DIAGNOSIS — Z794 Long term (current) use of insulin: Secondary | ICD-10-CM | POA: Diagnosis not present

## 2019-12-19 DIAGNOSIS — I2510 Atherosclerotic heart disease of native coronary artery without angina pectoris: Secondary | ICD-10-CM | POA: Diagnosis not present

## 2019-12-19 DIAGNOSIS — Z7984 Long term (current) use of oral hypoglycemic drugs: Secondary | ICD-10-CM | POA: Diagnosis not present

## 2019-12-19 DIAGNOSIS — F17210 Nicotine dependence, cigarettes, uncomplicated: Secondary | ICD-10-CM | POA: Diagnosis not present

## 2019-12-21 DIAGNOSIS — D509 Iron deficiency anemia, unspecified: Secondary | ICD-10-CM | POA: Diagnosis not present

## 2019-12-21 DIAGNOSIS — E039 Hypothyroidism, unspecified: Secondary | ICD-10-CM | POA: Diagnosis not present

## 2019-12-21 DIAGNOSIS — E119 Type 2 diabetes mellitus without complications: Secondary | ICD-10-CM | POA: Diagnosis not present

## 2019-12-21 DIAGNOSIS — I480 Paroxysmal atrial fibrillation: Secondary | ICD-10-CM | POA: Diagnosis not present

## 2019-12-21 DIAGNOSIS — S32001D Stable burst fracture of unspecified lumbar vertebra, subsequent encounter for fracture with routine healing: Secondary | ICD-10-CM | POA: Diagnosis not present

## 2019-12-21 DIAGNOSIS — J449 Chronic obstructive pulmonary disease, unspecified: Secondary | ICD-10-CM | POA: Diagnosis not present

## 2019-12-25 DIAGNOSIS — I480 Paroxysmal atrial fibrillation: Secondary | ICD-10-CM | POA: Diagnosis not present

## 2019-12-25 DIAGNOSIS — S32001D Stable burst fracture of unspecified lumbar vertebra, subsequent encounter for fracture with routine healing: Secondary | ICD-10-CM | POA: Diagnosis not present

## 2019-12-25 DIAGNOSIS — D509 Iron deficiency anemia, unspecified: Secondary | ICD-10-CM | POA: Diagnosis not present

## 2019-12-25 DIAGNOSIS — E119 Type 2 diabetes mellitus without complications: Secondary | ICD-10-CM | POA: Diagnosis not present

## 2019-12-25 DIAGNOSIS — E039 Hypothyroidism, unspecified: Secondary | ICD-10-CM | POA: Diagnosis not present

## 2019-12-25 DIAGNOSIS — J449 Chronic obstructive pulmonary disease, unspecified: Secondary | ICD-10-CM | POA: Diagnosis not present

## 2019-12-26 DIAGNOSIS — E119 Type 2 diabetes mellitus without complications: Secondary | ICD-10-CM | POA: Diagnosis not present

## 2019-12-26 DIAGNOSIS — N390 Urinary tract infection, site not specified: Secondary | ICD-10-CM | POA: Diagnosis not present

## 2019-12-26 DIAGNOSIS — E039 Hypothyroidism, unspecified: Secondary | ICD-10-CM | POA: Diagnosis not present

## 2019-12-26 DIAGNOSIS — D509 Iron deficiency anemia, unspecified: Secondary | ICD-10-CM | POA: Diagnosis not present

## 2019-12-26 DIAGNOSIS — I480 Paroxysmal atrial fibrillation: Secondary | ICD-10-CM | POA: Diagnosis not present

## 2019-12-26 DIAGNOSIS — S32001D Stable burst fracture of unspecified lumbar vertebra, subsequent encounter for fracture with routine healing: Secondary | ICD-10-CM | POA: Diagnosis not present

## 2019-12-26 DIAGNOSIS — J449 Chronic obstructive pulmonary disease, unspecified: Secondary | ICD-10-CM | POA: Diagnosis not present

## 2019-12-30 DIAGNOSIS — S32001D Stable burst fracture of unspecified lumbar vertebra, subsequent encounter for fracture with routine healing: Secondary | ICD-10-CM | POA: Diagnosis not present

## 2019-12-30 DIAGNOSIS — J449 Chronic obstructive pulmonary disease, unspecified: Secondary | ICD-10-CM | POA: Diagnosis not present

## 2019-12-30 DIAGNOSIS — E039 Hypothyroidism, unspecified: Secondary | ICD-10-CM | POA: Diagnosis not present

## 2019-12-30 DIAGNOSIS — I480 Paroxysmal atrial fibrillation: Secondary | ICD-10-CM | POA: Diagnosis not present

## 2019-12-30 DIAGNOSIS — D509 Iron deficiency anemia, unspecified: Secondary | ICD-10-CM | POA: Diagnosis not present

## 2019-12-30 DIAGNOSIS — E119 Type 2 diabetes mellitus without complications: Secondary | ICD-10-CM | POA: Diagnosis not present

## 2019-12-31 DIAGNOSIS — J449 Chronic obstructive pulmonary disease, unspecified: Secondary | ICD-10-CM | POA: Diagnosis not present

## 2019-12-31 DIAGNOSIS — E119 Type 2 diabetes mellitus without complications: Secondary | ICD-10-CM | POA: Diagnosis not present

## 2019-12-31 DIAGNOSIS — E039 Hypothyroidism, unspecified: Secondary | ICD-10-CM | POA: Diagnosis not present

## 2019-12-31 DIAGNOSIS — D509 Iron deficiency anemia, unspecified: Secondary | ICD-10-CM | POA: Diagnosis not present

## 2019-12-31 DIAGNOSIS — I480 Paroxysmal atrial fibrillation: Secondary | ICD-10-CM | POA: Diagnosis not present

## 2019-12-31 DIAGNOSIS — S32001D Stable burst fracture of unspecified lumbar vertebra, subsequent encounter for fracture with routine healing: Secondary | ICD-10-CM | POA: Diagnosis not present

## 2020-01-02 DIAGNOSIS — K5909 Other constipation: Secondary | ICD-10-CM | POA: Diagnosis not present

## 2020-01-02 DIAGNOSIS — E1140 Type 2 diabetes mellitus with diabetic neuropathy, unspecified: Secondary | ICD-10-CM | POA: Diagnosis not present

## 2020-01-02 DIAGNOSIS — J449 Chronic obstructive pulmonary disease, unspecified: Secondary | ICD-10-CM | POA: Diagnosis not present

## 2020-01-02 DIAGNOSIS — I2699 Other pulmonary embolism without acute cor pulmonale: Secondary | ICD-10-CM | POA: Diagnosis not present

## 2020-01-02 DIAGNOSIS — D692 Other nonthrombocytopenic purpura: Secondary | ICD-10-CM | POA: Diagnosis not present

## 2020-01-02 DIAGNOSIS — I10 Essential (primary) hypertension: Secondary | ICD-10-CM | POA: Diagnosis not present

## 2020-01-02 DIAGNOSIS — M545 Low back pain: Secondary | ICD-10-CM | POA: Diagnosis not present

## 2020-01-02 DIAGNOSIS — J9691 Respiratory failure, unspecified with hypoxia: Secondary | ICD-10-CM | POA: Diagnosis not present

## 2020-01-02 DIAGNOSIS — E291 Testicular hypofunction: Secondary | ICD-10-CM | POA: Diagnosis not present

## 2020-01-02 DIAGNOSIS — J309 Allergic rhinitis, unspecified: Secondary | ICD-10-CM | POA: Diagnosis not present

## 2020-01-02 DIAGNOSIS — G8929 Other chronic pain: Secondary | ICD-10-CM | POA: Diagnosis not present

## 2020-01-02 DIAGNOSIS — E118 Type 2 diabetes mellitus with unspecified complications: Secondary | ICD-10-CM | POA: Diagnosis not present

## 2020-01-08 DIAGNOSIS — S32001D Stable burst fracture of unspecified lumbar vertebra, subsequent encounter for fracture with routine healing: Secondary | ICD-10-CM | POA: Diagnosis not present

## 2020-01-08 DIAGNOSIS — E119 Type 2 diabetes mellitus without complications: Secondary | ICD-10-CM | POA: Diagnosis not present

## 2020-01-08 DIAGNOSIS — E039 Hypothyroidism, unspecified: Secondary | ICD-10-CM | POA: Diagnosis not present

## 2020-01-08 DIAGNOSIS — I480 Paroxysmal atrial fibrillation: Secondary | ICD-10-CM | POA: Diagnosis not present

## 2020-01-08 DIAGNOSIS — J449 Chronic obstructive pulmonary disease, unspecified: Secondary | ICD-10-CM | POA: Diagnosis not present

## 2020-01-08 DIAGNOSIS — D509 Iron deficiency anemia, unspecified: Secondary | ICD-10-CM | POA: Diagnosis not present

## 2020-01-09 DIAGNOSIS — D509 Iron deficiency anemia, unspecified: Secondary | ICD-10-CM | POA: Diagnosis not present

## 2020-01-09 DIAGNOSIS — S32001D Stable burst fracture of unspecified lumbar vertebra, subsequent encounter for fracture with routine healing: Secondary | ICD-10-CM | POA: Diagnosis not present

## 2020-01-09 DIAGNOSIS — E039 Hypothyroidism, unspecified: Secondary | ICD-10-CM | POA: Diagnosis not present

## 2020-01-09 DIAGNOSIS — J449 Chronic obstructive pulmonary disease, unspecified: Secondary | ICD-10-CM | POA: Diagnosis not present

## 2020-01-09 DIAGNOSIS — E119 Type 2 diabetes mellitus without complications: Secondary | ICD-10-CM | POA: Diagnosis not present

## 2020-01-09 DIAGNOSIS — I480 Paroxysmal atrial fibrillation: Secondary | ICD-10-CM | POA: Diagnosis not present

## 2020-01-10 DIAGNOSIS — E1140 Type 2 diabetes mellitus with diabetic neuropathy, unspecified: Secondary | ICD-10-CM | POA: Diagnosis not present

## 2020-01-10 DIAGNOSIS — J309 Allergic rhinitis, unspecified: Secondary | ICD-10-CM | POA: Diagnosis not present

## 2020-01-10 DIAGNOSIS — I2699 Other pulmonary embolism without acute cor pulmonale: Secondary | ICD-10-CM | POA: Diagnosis not present

## 2020-01-10 DIAGNOSIS — J449 Chronic obstructive pulmonary disease, unspecified: Secondary | ICD-10-CM | POA: Diagnosis not present

## 2020-01-10 DIAGNOSIS — M818 Other osteoporosis without current pathological fracture: Secondary | ICD-10-CM | POA: Diagnosis not present

## 2020-01-10 DIAGNOSIS — R413 Other amnesia: Secondary | ICD-10-CM | POA: Diagnosis not present

## 2020-01-10 DIAGNOSIS — E291 Testicular hypofunction: Secondary | ICD-10-CM | POA: Diagnosis not present

## 2020-01-10 DIAGNOSIS — E118 Type 2 diabetes mellitus with unspecified complications: Secondary | ICD-10-CM | POA: Diagnosis not present

## 2020-01-10 DIAGNOSIS — J9691 Respiratory failure, unspecified with hypoxia: Secondary | ICD-10-CM | POA: Diagnosis not present

## 2020-01-10 DIAGNOSIS — I10 Essential (primary) hypertension: Secondary | ICD-10-CM | POA: Diagnosis not present

## 2020-01-10 DIAGNOSIS — K5909 Other constipation: Secondary | ICD-10-CM | POA: Diagnosis not present

## 2020-01-10 DIAGNOSIS — D692 Other nonthrombocytopenic purpura: Secondary | ICD-10-CM | POA: Diagnosis not present

## 2020-01-15 DIAGNOSIS — I480 Paroxysmal atrial fibrillation: Secondary | ICD-10-CM | POA: Diagnosis not present

## 2020-01-15 DIAGNOSIS — D509 Iron deficiency anemia, unspecified: Secondary | ICD-10-CM | POA: Diagnosis not present

## 2020-01-15 DIAGNOSIS — G894 Chronic pain syndrome: Secondary | ICD-10-CM | POA: Diagnosis not present

## 2020-01-15 DIAGNOSIS — S32001D Stable burst fracture of unspecified lumbar vertebra, subsequent encounter for fracture with routine healing: Secondary | ICD-10-CM | POA: Diagnosis not present

## 2020-01-15 DIAGNOSIS — E039 Hypothyroidism, unspecified: Secondary | ICD-10-CM | POA: Diagnosis not present

## 2020-01-15 DIAGNOSIS — J449 Chronic obstructive pulmonary disease, unspecified: Secondary | ICD-10-CM | POA: Diagnosis not present

## 2020-01-15 DIAGNOSIS — M47816 Spondylosis without myelopathy or radiculopathy, lumbar region: Secondary | ICD-10-CM | POA: Diagnosis not present

## 2020-01-15 DIAGNOSIS — Z1389 Encounter for screening for other disorder: Secondary | ICD-10-CM | POA: Diagnosis not present

## 2020-01-15 DIAGNOSIS — E119 Type 2 diabetes mellitus without complications: Secondary | ICD-10-CM | POA: Diagnosis not present

## 2020-01-16 DIAGNOSIS — D509 Iron deficiency anemia, unspecified: Secondary | ICD-10-CM | POA: Diagnosis not present

## 2020-01-16 DIAGNOSIS — E119 Type 2 diabetes mellitus without complications: Secondary | ICD-10-CM | POA: Diagnosis not present

## 2020-01-16 DIAGNOSIS — S32001D Stable burst fracture of unspecified lumbar vertebra, subsequent encounter for fracture with routine healing: Secondary | ICD-10-CM | POA: Diagnosis not present

## 2020-01-16 DIAGNOSIS — J449 Chronic obstructive pulmonary disease, unspecified: Secondary | ICD-10-CM | POA: Diagnosis not present

## 2020-01-16 DIAGNOSIS — I480 Paroxysmal atrial fibrillation: Secondary | ICD-10-CM | POA: Diagnosis not present

## 2020-01-16 DIAGNOSIS — E039 Hypothyroidism, unspecified: Secondary | ICD-10-CM | POA: Diagnosis not present

## 2020-01-17 DIAGNOSIS — M545 Low back pain, unspecified: Secondary | ICD-10-CM | POA: Diagnosis not present

## 2020-01-17 DIAGNOSIS — J9691 Respiratory failure, unspecified with hypoxia: Secondary | ICD-10-CM | POA: Diagnosis not present

## 2020-01-17 DIAGNOSIS — E1140 Type 2 diabetes mellitus with diabetic neuropathy, unspecified: Secondary | ICD-10-CM | POA: Diagnosis not present

## 2020-01-17 DIAGNOSIS — K5909 Other constipation: Secondary | ICD-10-CM | POA: Diagnosis not present

## 2020-01-17 DIAGNOSIS — I10 Essential (primary) hypertension: Secondary | ICD-10-CM | POA: Diagnosis not present

## 2020-01-17 DIAGNOSIS — E118 Type 2 diabetes mellitus with unspecified complications: Secondary | ICD-10-CM | POA: Diagnosis not present

## 2020-01-17 DIAGNOSIS — Z23 Encounter for immunization: Secondary | ICD-10-CM | POA: Diagnosis not present

## 2020-01-17 DIAGNOSIS — D692 Other nonthrombocytopenic purpura: Secondary | ICD-10-CM | POA: Diagnosis not present

## 2020-01-17 DIAGNOSIS — J449 Chronic obstructive pulmonary disease, unspecified: Secondary | ICD-10-CM | POA: Diagnosis not present

## 2020-01-17 DIAGNOSIS — I2699 Other pulmonary embolism without acute cor pulmonale: Secondary | ICD-10-CM | POA: Diagnosis not present

## 2020-01-17 DIAGNOSIS — G8929 Other chronic pain: Secondary | ICD-10-CM | POA: Diagnosis not present

## 2020-01-17 DIAGNOSIS — J309 Allergic rhinitis, unspecified: Secondary | ICD-10-CM | POA: Diagnosis not present

## 2020-01-18 DIAGNOSIS — E119 Type 2 diabetes mellitus without complications: Secondary | ICD-10-CM | POA: Diagnosis not present

## 2020-01-18 DIAGNOSIS — Z79891 Long term (current) use of opiate analgesic: Secondary | ICD-10-CM | POA: Diagnosis not present

## 2020-01-18 DIAGNOSIS — Z9981 Dependence on supplemental oxygen: Secondary | ICD-10-CM | POA: Diagnosis not present

## 2020-01-18 DIAGNOSIS — J449 Chronic obstructive pulmonary disease, unspecified: Secondary | ICD-10-CM | POA: Diagnosis not present

## 2020-01-18 DIAGNOSIS — Z79899 Other long term (current) drug therapy: Secondary | ICD-10-CM | POA: Diagnosis not present

## 2020-01-18 DIAGNOSIS — E039 Hypothyroidism, unspecified: Secondary | ICD-10-CM | POA: Diagnosis not present

## 2020-01-18 DIAGNOSIS — R5381 Other malaise: Secondary | ICD-10-CM | POA: Diagnosis not present

## 2020-01-18 DIAGNOSIS — Z794 Long term (current) use of insulin: Secondary | ICD-10-CM | POA: Diagnosis not present

## 2020-01-18 DIAGNOSIS — F17210 Nicotine dependence, cigarettes, uncomplicated: Secondary | ICD-10-CM | POA: Diagnosis not present

## 2020-01-18 DIAGNOSIS — I480 Paroxysmal atrial fibrillation: Secondary | ICD-10-CM | POA: Diagnosis not present

## 2020-01-18 DIAGNOSIS — R531 Weakness: Secondary | ICD-10-CM | POA: Diagnosis not present

## 2020-01-18 DIAGNOSIS — E559 Vitamin D deficiency, unspecified: Secondary | ICD-10-CM | POA: Diagnosis not present

## 2020-01-18 DIAGNOSIS — D509 Iron deficiency anemia, unspecified: Secondary | ICD-10-CM | POA: Diagnosis not present

## 2020-01-18 DIAGNOSIS — X500XXD Overexertion from strenuous movement or load, subsequent encounter: Secondary | ICD-10-CM | POA: Diagnosis not present

## 2020-01-18 DIAGNOSIS — I2510 Atherosclerotic heart disease of native coronary artery without angina pectoris: Secondary | ICD-10-CM | POA: Diagnosis not present

## 2020-01-18 DIAGNOSIS — K219 Gastro-esophageal reflux disease without esophagitis: Secondary | ICD-10-CM | POA: Diagnosis not present

## 2020-01-18 DIAGNOSIS — Z7984 Long term (current) use of oral hypoglycemic drugs: Secondary | ICD-10-CM | POA: Diagnosis not present

## 2020-01-18 DIAGNOSIS — S32001D Stable burst fracture of unspecified lumbar vertebra, subsequent encounter for fracture with routine healing: Secondary | ICD-10-CM | POA: Diagnosis not present

## 2020-01-22 DIAGNOSIS — S32001D Stable burst fracture of unspecified lumbar vertebra, subsequent encounter for fracture with routine healing: Secondary | ICD-10-CM | POA: Diagnosis not present

## 2020-01-22 DIAGNOSIS — J449 Chronic obstructive pulmonary disease, unspecified: Secondary | ICD-10-CM | POA: Diagnosis not present

## 2020-01-22 DIAGNOSIS — E119 Type 2 diabetes mellitus without complications: Secondary | ICD-10-CM | POA: Diagnosis not present

## 2020-01-22 DIAGNOSIS — D509 Iron deficiency anemia, unspecified: Secondary | ICD-10-CM | POA: Diagnosis not present

## 2020-01-22 DIAGNOSIS — E039 Hypothyroidism, unspecified: Secondary | ICD-10-CM | POA: Diagnosis not present

## 2020-01-22 DIAGNOSIS — I480 Paroxysmal atrial fibrillation: Secondary | ICD-10-CM | POA: Diagnosis not present

## 2020-01-27 DIAGNOSIS — J449 Chronic obstructive pulmonary disease, unspecified: Secondary | ICD-10-CM | POA: Diagnosis not present

## 2020-01-27 DIAGNOSIS — D509 Iron deficiency anemia, unspecified: Secondary | ICD-10-CM | POA: Diagnosis not present

## 2020-01-27 DIAGNOSIS — E119 Type 2 diabetes mellitus without complications: Secondary | ICD-10-CM | POA: Diagnosis not present

## 2020-01-27 DIAGNOSIS — S32001D Stable burst fracture of unspecified lumbar vertebra, subsequent encounter for fracture with routine healing: Secondary | ICD-10-CM | POA: Diagnosis not present

## 2020-01-27 DIAGNOSIS — E039 Hypothyroidism, unspecified: Secondary | ICD-10-CM | POA: Diagnosis not present

## 2020-01-27 DIAGNOSIS — I480 Paroxysmal atrial fibrillation: Secondary | ICD-10-CM | POA: Diagnosis not present

## 2020-01-31 DIAGNOSIS — I2699 Other pulmonary embolism without acute cor pulmonale: Secondary | ICD-10-CM | POA: Diagnosis not present

## 2020-01-31 DIAGNOSIS — E118 Type 2 diabetes mellitus with unspecified complications: Secondary | ICD-10-CM | POA: Diagnosis not present

## 2020-01-31 DIAGNOSIS — G8929 Other chronic pain: Secondary | ICD-10-CM | POA: Diagnosis not present

## 2020-01-31 DIAGNOSIS — E1140 Type 2 diabetes mellitus with diabetic neuropathy, unspecified: Secondary | ICD-10-CM | POA: Diagnosis not present

## 2020-01-31 DIAGNOSIS — E039 Hypothyroidism, unspecified: Secondary | ICD-10-CM | POA: Diagnosis not present

## 2020-01-31 DIAGNOSIS — J449 Chronic obstructive pulmonary disease, unspecified: Secondary | ICD-10-CM | POA: Diagnosis not present

## 2020-01-31 DIAGNOSIS — J9691 Respiratory failure, unspecified with hypoxia: Secondary | ICD-10-CM | POA: Diagnosis not present

## 2020-01-31 DIAGNOSIS — I10 Essential (primary) hypertension: Secondary | ICD-10-CM | POA: Diagnosis not present

## 2020-01-31 DIAGNOSIS — R413 Other amnesia: Secondary | ICD-10-CM | POA: Diagnosis not present

## 2020-01-31 DIAGNOSIS — M545 Low back pain, unspecified: Secondary | ICD-10-CM | POA: Diagnosis not present

## 2020-01-31 DIAGNOSIS — K5909 Other constipation: Secondary | ICD-10-CM | POA: Diagnosis not present

## 2020-01-31 DIAGNOSIS — E291 Testicular hypofunction: Secondary | ICD-10-CM | POA: Diagnosis not present

## 2020-01-31 DIAGNOSIS — D692 Other nonthrombocytopenic purpura: Secondary | ICD-10-CM | POA: Diagnosis not present

## 2020-02-04 DIAGNOSIS — E039 Hypothyroidism, unspecified: Secondary | ICD-10-CM | POA: Diagnosis not present

## 2020-02-04 DIAGNOSIS — D509 Iron deficiency anemia, unspecified: Secondary | ICD-10-CM | POA: Diagnosis not present

## 2020-02-04 DIAGNOSIS — J449 Chronic obstructive pulmonary disease, unspecified: Secondary | ICD-10-CM | POA: Diagnosis not present

## 2020-02-04 DIAGNOSIS — I480 Paroxysmal atrial fibrillation: Secondary | ICD-10-CM | POA: Diagnosis not present

## 2020-02-04 DIAGNOSIS — S32001D Stable burst fracture of unspecified lumbar vertebra, subsequent encounter for fracture with routine healing: Secondary | ICD-10-CM | POA: Diagnosis not present

## 2020-02-04 DIAGNOSIS — E119 Type 2 diabetes mellitus without complications: Secondary | ICD-10-CM | POA: Diagnosis not present

## 2020-02-05 DIAGNOSIS — M47818 Spondylosis without myelopathy or radiculopathy, sacral and sacrococcygeal region: Secondary | ICD-10-CM | POA: Diagnosis not present

## 2020-02-05 DIAGNOSIS — G894 Chronic pain syndrome: Secondary | ICD-10-CM | POA: Diagnosis not present

## 2020-02-05 DIAGNOSIS — M47817 Spondylosis without myelopathy or radiculopathy, lumbosacral region: Secondary | ICD-10-CM | POA: Diagnosis not present

## 2020-02-11 DIAGNOSIS — E119 Type 2 diabetes mellitus without complications: Secondary | ICD-10-CM | POA: Diagnosis not present

## 2020-02-11 DIAGNOSIS — J449 Chronic obstructive pulmonary disease, unspecified: Secondary | ICD-10-CM | POA: Diagnosis not present

## 2020-02-11 DIAGNOSIS — E039 Hypothyroidism, unspecified: Secondary | ICD-10-CM | POA: Diagnosis not present

## 2020-02-11 DIAGNOSIS — D509 Iron deficiency anemia, unspecified: Secondary | ICD-10-CM | POA: Diagnosis not present

## 2020-02-11 DIAGNOSIS — I480 Paroxysmal atrial fibrillation: Secondary | ICD-10-CM | POA: Diagnosis not present

## 2020-02-11 DIAGNOSIS — S32001D Stable burst fracture of unspecified lumbar vertebra, subsequent encounter for fracture with routine healing: Secondary | ICD-10-CM | POA: Diagnosis not present

## 2020-02-12 DIAGNOSIS — M47816 Spondylosis without myelopathy or radiculopathy, lumbar region: Secondary | ICD-10-CM | POA: Diagnosis not present

## 2020-02-12 DIAGNOSIS — G894 Chronic pain syndrome: Secondary | ICD-10-CM | POA: Diagnosis not present

## 2020-02-12 DIAGNOSIS — M47817 Spondylosis without myelopathy or radiculopathy, lumbosacral region: Secondary | ICD-10-CM | POA: Diagnosis not present

## 2020-02-14 DIAGNOSIS — E291 Testicular hypofunction: Secondary | ICD-10-CM | POA: Diagnosis not present

## 2020-02-14 DIAGNOSIS — J208 Acute bronchitis due to other specified organisms: Secondary | ICD-10-CM | POA: Diagnosis not present

## 2020-02-14 DIAGNOSIS — J9691 Respiratory failure, unspecified with hypoxia: Secondary | ICD-10-CM | POA: Diagnosis not present

## 2020-02-14 DIAGNOSIS — J449 Chronic obstructive pulmonary disease, unspecified: Secondary | ICD-10-CM | POA: Diagnosis not present

## 2020-02-14 DIAGNOSIS — E1140 Type 2 diabetes mellitus with diabetic neuropathy, unspecified: Secondary | ICD-10-CM | POA: Diagnosis not present

## 2020-02-14 DIAGNOSIS — B9689 Other specified bacterial agents as the cause of diseases classified elsewhere: Secondary | ICD-10-CM | POA: Diagnosis not present

## 2020-02-14 DIAGNOSIS — D692 Other nonthrombocytopenic purpura: Secondary | ICD-10-CM | POA: Diagnosis not present

## 2020-02-14 DIAGNOSIS — R413 Other amnesia: Secondary | ICD-10-CM | POA: Diagnosis not present

## 2020-02-14 DIAGNOSIS — G8929 Other chronic pain: Secondary | ICD-10-CM | POA: Diagnosis not present

## 2020-02-14 DIAGNOSIS — I2699 Other pulmonary embolism without acute cor pulmonale: Secondary | ICD-10-CM | POA: Diagnosis not present

## 2020-02-14 DIAGNOSIS — K5909 Other constipation: Secondary | ICD-10-CM | POA: Diagnosis not present

## 2020-02-14 DIAGNOSIS — M545 Low back pain, unspecified: Secondary | ICD-10-CM | POA: Diagnosis not present

## 2020-02-28 DIAGNOSIS — I2699 Other pulmonary embolism without acute cor pulmonale: Secondary | ICD-10-CM | POA: Diagnosis not present

## 2020-02-28 DIAGNOSIS — M545 Low back pain, unspecified: Secondary | ICD-10-CM | POA: Diagnosis not present

## 2020-02-28 DIAGNOSIS — J449 Chronic obstructive pulmonary disease, unspecified: Secondary | ICD-10-CM | POA: Diagnosis not present

## 2020-02-28 DIAGNOSIS — D692 Other nonthrombocytopenic purpura: Secondary | ICD-10-CM | POA: Diagnosis not present

## 2020-02-28 DIAGNOSIS — K5909 Other constipation: Secondary | ICD-10-CM | POA: Diagnosis not present

## 2020-02-28 DIAGNOSIS — E1140 Type 2 diabetes mellitus with diabetic neuropathy, unspecified: Secondary | ICD-10-CM | POA: Diagnosis not present

## 2020-02-28 DIAGNOSIS — G8929 Other chronic pain: Secondary | ICD-10-CM | POA: Diagnosis not present

## 2020-02-28 DIAGNOSIS — R413 Other amnesia: Secondary | ICD-10-CM | POA: Diagnosis not present

## 2020-02-28 DIAGNOSIS — R1013 Epigastric pain: Secondary | ICD-10-CM | POA: Diagnosis not present

## 2020-02-28 DIAGNOSIS — J9691 Respiratory failure, unspecified with hypoxia: Secondary | ICD-10-CM | POA: Diagnosis not present

## 2020-02-28 DIAGNOSIS — I679 Cerebrovascular disease, unspecified: Secondary | ICD-10-CM | POA: Diagnosis not present

## 2020-02-28 DIAGNOSIS — E291 Testicular hypofunction: Secondary | ICD-10-CM | POA: Diagnosis not present

## 2020-03-27 DIAGNOSIS — I679 Cerebrovascular disease, unspecified: Secondary | ICD-10-CM | POA: Diagnosis not present

## 2020-03-27 DIAGNOSIS — E291 Testicular hypofunction: Secondary | ICD-10-CM | POA: Diagnosis not present

## 2020-03-27 DIAGNOSIS — J449 Chronic obstructive pulmonary disease, unspecified: Secondary | ICD-10-CM | POA: Diagnosis not present

## 2020-03-27 DIAGNOSIS — R1013 Epigastric pain: Secondary | ICD-10-CM | POA: Diagnosis not present

## 2020-03-27 DIAGNOSIS — K5909 Other constipation: Secondary | ICD-10-CM | POA: Diagnosis not present

## 2020-03-27 DIAGNOSIS — J9691 Respiratory failure, unspecified with hypoxia: Secondary | ICD-10-CM | POA: Diagnosis not present

## 2020-03-27 DIAGNOSIS — D692 Other nonthrombocytopenic purpura: Secondary | ICD-10-CM | POA: Diagnosis not present

## 2020-03-27 DIAGNOSIS — R413 Other amnesia: Secondary | ICD-10-CM | POA: Diagnosis not present

## 2020-03-27 DIAGNOSIS — M545 Low back pain, unspecified: Secondary | ICD-10-CM | POA: Diagnosis not present

## 2020-03-27 DIAGNOSIS — G8929 Other chronic pain: Secondary | ICD-10-CM | POA: Diagnosis not present

## 2020-03-27 DIAGNOSIS — E1140 Type 2 diabetes mellitus with diabetic neuropathy, unspecified: Secondary | ICD-10-CM | POA: Diagnosis not present

## 2020-03-27 DIAGNOSIS — I2699 Other pulmonary embolism without acute cor pulmonale: Secondary | ICD-10-CM | POA: Diagnosis not present

## 2020-04-30 DIAGNOSIS — J449 Chronic obstructive pulmonary disease, unspecified: Secondary | ICD-10-CM | POA: Diagnosis not present

## 2020-04-30 DIAGNOSIS — J9691 Respiratory failure, unspecified with hypoxia: Secondary | ICD-10-CM | POA: Diagnosis not present

## 2020-04-30 DIAGNOSIS — B9689 Other specified bacterial agents as the cause of diseases classified elsewhere: Secondary | ICD-10-CM | POA: Diagnosis not present

## 2020-04-30 DIAGNOSIS — E291 Testicular hypofunction: Secondary | ICD-10-CM | POA: Diagnosis not present

## 2020-04-30 DIAGNOSIS — M545 Low back pain, unspecified: Secondary | ICD-10-CM | POA: Diagnosis not present

## 2020-04-30 DIAGNOSIS — K5909 Other constipation: Secondary | ICD-10-CM | POA: Diagnosis not present

## 2020-04-30 DIAGNOSIS — D692 Other nonthrombocytopenic purpura: Secondary | ICD-10-CM | POA: Diagnosis not present

## 2020-04-30 DIAGNOSIS — G8929 Other chronic pain: Secondary | ICD-10-CM | POA: Diagnosis not present

## 2020-04-30 DIAGNOSIS — E1140 Type 2 diabetes mellitus with diabetic neuropathy, unspecified: Secondary | ICD-10-CM | POA: Diagnosis not present

## 2020-04-30 DIAGNOSIS — R413 Other amnesia: Secondary | ICD-10-CM | POA: Diagnosis not present

## 2020-04-30 DIAGNOSIS — J208 Acute bronchitis due to other specified organisms: Secondary | ICD-10-CM | POA: Diagnosis not present

## 2020-04-30 DIAGNOSIS — I2699 Other pulmonary embolism without acute cor pulmonale: Secondary | ICD-10-CM | POA: Diagnosis not present

## 2020-05-21 DIAGNOSIS — K5909 Other constipation: Secondary | ICD-10-CM | POA: Diagnosis not present

## 2020-05-21 DIAGNOSIS — J9691 Respiratory failure, unspecified with hypoxia: Secondary | ICD-10-CM | POA: Diagnosis not present

## 2020-05-21 DIAGNOSIS — R413 Other amnesia: Secondary | ICD-10-CM | POA: Diagnosis not present

## 2020-05-21 DIAGNOSIS — J449 Chronic obstructive pulmonary disease, unspecified: Secondary | ICD-10-CM | POA: Diagnosis not present

## 2020-05-21 DIAGNOSIS — G8929 Other chronic pain: Secondary | ICD-10-CM | POA: Diagnosis not present

## 2020-05-21 DIAGNOSIS — D692 Other nonthrombocytopenic purpura: Secondary | ICD-10-CM | POA: Diagnosis not present

## 2020-05-21 DIAGNOSIS — E1140 Type 2 diabetes mellitus with diabetic neuropathy, unspecified: Secondary | ICD-10-CM | POA: Diagnosis not present

## 2020-05-21 DIAGNOSIS — I2699 Other pulmonary embolism without acute cor pulmonale: Secondary | ICD-10-CM | POA: Diagnosis not present

## 2020-05-21 DIAGNOSIS — M545 Low back pain, unspecified: Secondary | ICD-10-CM | POA: Diagnosis not present

## 2020-05-21 DIAGNOSIS — E291 Testicular hypofunction: Secondary | ICD-10-CM | POA: Diagnosis not present

## 2020-05-21 DIAGNOSIS — R600 Localized edema: Secondary | ICD-10-CM | POA: Diagnosis not present

## 2020-05-21 DIAGNOSIS — I679 Cerebrovascular disease, unspecified: Secondary | ICD-10-CM | POA: Diagnosis not present

## 2020-05-28 DIAGNOSIS — D692 Other nonthrombocytopenic purpura: Secondary | ICD-10-CM | POA: Diagnosis not present

## 2020-05-28 DIAGNOSIS — R1013 Epigastric pain: Secondary | ICD-10-CM | POA: Diagnosis not present

## 2020-05-28 DIAGNOSIS — K5909 Other constipation: Secondary | ICD-10-CM | POA: Diagnosis not present

## 2020-05-28 DIAGNOSIS — I10 Essential (primary) hypertension: Secondary | ICD-10-CM | POA: Diagnosis not present

## 2020-05-28 DIAGNOSIS — E1140 Type 2 diabetes mellitus with diabetic neuropathy, unspecified: Secondary | ICD-10-CM | POA: Diagnosis not present

## 2020-05-28 DIAGNOSIS — J9691 Respiratory failure, unspecified with hypoxia: Secondary | ICD-10-CM | POA: Diagnosis not present

## 2020-05-28 DIAGNOSIS — G8929 Other chronic pain: Secondary | ICD-10-CM | POA: Diagnosis not present

## 2020-05-28 DIAGNOSIS — J449 Chronic obstructive pulmonary disease, unspecified: Secondary | ICD-10-CM | POA: Diagnosis not present

## 2020-05-28 DIAGNOSIS — E291 Testicular hypofunction: Secondary | ICD-10-CM | POA: Diagnosis not present

## 2020-05-28 DIAGNOSIS — M545 Low back pain, unspecified: Secondary | ICD-10-CM | POA: Diagnosis not present

## 2020-05-28 DIAGNOSIS — R413 Other amnesia: Secondary | ICD-10-CM | POA: Diagnosis not present

## 2020-05-28 DIAGNOSIS — I2699 Other pulmonary embolism without acute cor pulmonale: Secondary | ICD-10-CM | POA: Diagnosis not present

## 2020-05-28 DIAGNOSIS — E118 Type 2 diabetes mellitus with unspecified complications: Secondary | ICD-10-CM | POA: Diagnosis not present

## 2020-05-28 DIAGNOSIS — I679 Cerebrovascular disease, unspecified: Secondary | ICD-10-CM | POA: Diagnosis not present

## 2020-06-15 DIAGNOSIS — R413 Other amnesia: Secondary | ICD-10-CM | POA: Diagnosis not present

## 2020-06-15 DIAGNOSIS — J9691 Respiratory failure, unspecified with hypoxia: Secondary | ICD-10-CM | POA: Diagnosis not present

## 2020-06-15 DIAGNOSIS — D692 Other nonthrombocytopenic purpura: Secondary | ICD-10-CM | POA: Diagnosis not present

## 2020-06-15 DIAGNOSIS — K5909 Other constipation: Secondary | ICD-10-CM | POA: Diagnosis not present

## 2020-06-15 DIAGNOSIS — E291 Testicular hypofunction: Secondary | ICD-10-CM | POA: Diagnosis not present

## 2020-06-15 DIAGNOSIS — I679 Cerebrovascular disease, unspecified: Secondary | ICD-10-CM | POA: Diagnosis not present

## 2020-06-15 DIAGNOSIS — J449 Chronic obstructive pulmonary disease, unspecified: Secondary | ICD-10-CM | POA: Diagnosis not present

## 2020-06-15 DIAGNOSIS — E1159 Type 2 diabetes mellitus with other circulatory complications: Secondary | ICD-10-CM | POA: Diagnosis not present

## 2020-06-15 DIAGNOSIS — M545 Low back pain, unspecified: Secondary | ICD-10-CM | POA: Diagnosis not present

## 2020-06-15 DIAGNOSIS — I2699 Other pulmonary embolism without acute cor pulmonale: Secondary | ICD-10-CM | POA: Diagnosis not present

## 2020-06-15 DIAGNOSIS — G8929 Other chronic pain: Secondary | ICD-10-CM | POA: Diagnosis not present

## 2020-06-15 DIAGNOSIS — E1140 Type 2 diabetes mellitus with diabetic neuropathy, unspecified: Secondary | ICD-10-CM | POA: Diagnosis not present

## 2020-06-27 DIAGNOSIS — R404 Transient alteration of awareness: Secondary | ICD-10-CM | POA: Diagnosis not present

## 2020-06-27 DIAGNOSIS — J441 Chronic obstructive pulmonary disease with (acute) exacerbation: Secondary | ICD-10-CM | POA: Diagnosis not present

## 2020-06-27 DIAGNOSIS — S3991XA Unspecified injury of abdomen, initial encounter: Secondary | ICD-10-CM | POA: Diagnosis not present

## 2020-06-27 DIAGNOSIS — I2510 Atherosclerotic heart disease of native coronary artery without angina pectoris: Secondary | ICD-10-CM | POA: Diagnosis not present

## 2020-06-27 DIAGNOSIS — R4182 Altered mental status, unspecified: Secondary | ICD-10-CM | POA: Diagnosis not present

## 2020-06-27 DIAGNOSIS — E161 Other hypoglycemia: Secondary | ICD-10-CM | POA: Diagnosis not present

## 2020-06-27 DIAGNOSIS — I700 Atherosclerosis of aorta: Secondary | ICD-10-CM | POA: Diagnosis not present

## 2020-06-27 DIAGNOSIS — G9341 Metabolic encephalopathy: Secondary | ICD-10-CM | POA: Diagnosis not present

## 2020-06-27 DIAGNOSIS — J189 Pneumonia, unspecified organism: Secondary | ICD-10-CM | POA: Diagnosis not present

## 2020-06-27 DIAGNOSIS — N3289 Other specified disorders of bladder: Secondary | ICD-10-CM | POA: Diagnosis not present

## 2020-06-27 DIAGNOSIS — M47819 Spondylosis without myelopathy or radiculopathy, site unspecified: Secondary | ICD-10-CM | POA: Diagnosis not present

## 2020-06-27 DIAGNOSIS — R410 Disorientation, unspecified: Secondary | ICD-10-CM | POA: Diagnosis not present

## 2020-06-27 DIAGNOSIS — J432 Centrilobular emphysema: Secondary | ICD-10-CM | POA: Diagnosis not present

## 2020-06-27 DIAGNOSIS — R52 Pain, unspecified: Secondary | ICD-10-CM | POA: Diagnosis not present

## 2020-06-27 DIAGNOSIS — E162 Hypoglycemia, unspecified: Secondary | ICD-10-CM | POA: Diagnosis not present

## 2020-06-27 DIAGNOSIS — R21 Rash and other nonspecific skin eruption: Secondary | ICD-10-CM | POA: Diagnosis not present

## 2020-06-28 DIAGNOSIS — E039 Hypothyroidism, unspecified: Secondary | ICD-10-CM | POA: Diagnosis present

## 2020-06-28 DIAGNOSIS — I252 Old myocardial infarction: Secondary | ICD-10-CM | POA: Diagnosis not present

## 2020-06-28 DIAGNOSIS — J9622 Acute and chronic respiratory failure with hypercapnia: Secondary | ICD-10-CM | POA: Diagnosis not present

## 2020-06-28 DIAGNOSIS — I2510 Atherosclerotic heart disease of native coronary artery without angina pectoris: Secondary | ICD-10-CM | POA: Diagnosis not present

## 2020-06-28 DIAGNOSIS — J439 Emphysema, unspecified: Secondary | ICD-10-CM | POA: Diagnosis not present

## 2020-06-28 DIAGNOSIS — R21 Rash and other nonspecific skin eruption: Secondary | ICD-10-CM | POA: Diagnosis not present

## 2020-06-28 DIAGNOSIS — I10 Essential (primary) hypertension: Secondary | ICD-10-CM | POA: Diagnosis not present

## 2020-06-28 DIAGNOSIS — Z794 Long term (current) use of insulin: Secondary | ICD-10-CM | POA: Diagnosis not present

## 2020-06-28 DIAGNOSIS — J432 Centrilobular emphysema: Secondary | ICD-10-CM | POA: Diagnosis not present

## 2020-06-28 DIAGNOSIS — Z792 Long term (current) use of antibiotics: Secondary | ICD-10-CM | POA: Diagnosis not present

## 2020-06-28 DIAGNOSIS — F17210 Nicotine dependence, cigarettes, uncomplicated: Secondary | ICD-10-CM | POA: Diagnosis not present

## 2020-06-28 DIAGNOSIS — S3991XA Unspecified injury of abdomen, initial encounter: Secondary | ICD-10-CM | POA: Diagnosis not present

## 2020-06-28 DIAGNOSIS — M47819 Spondylosis without myelopathy or radiculopathy, site unspecified: Secondary | ICD-10-CM | POA: Diagnosis not present

## 2020-06-28 DIAGNOSIS — Z885 Allergy status to narcotic agent status: Secondary | ICD-10-CM | POA: Diagnosis not present

## 2020-06-28 DIAGNOSIS — Z8673 Personal history of transient ischemic attack (TIA), and cerebral infarction without residual deficits: Secondary | ICD-10-CM | POA: Diagnosis not present

## 2020-06-28 DIAGNOSIS — N3289 Other specified disorders of bladder: Secondary | ICD-10-CM | POA: Diagnosis not present

## 2020-06-28 DIAGNOSIS — I700 Atherosclerosis of aorta: Secondary | ICD-10-CM | POA: Diagnosis not present

## 2020-06-28 DIAGNOSIS — K219 Gastro-esophageal reflux disease without esophagitis: Secondary | ICD-10-CM | POA: Diagnosis not present

## 2020-06-28 DIAGNOSIS — Z9119 Patient's noncompliance with other medical treatment and regimen: Secondary | ICD-10-CM | POA: Diagnosis not present

## 2020-06-28 DIAGNOSIS — D509 Iron deficiency anemia, unspecified: Secondary | ICD-10-CM | POA: Diagnosis not present

## 2020-06-28 DIAGNOSIS — Z7952 Long term (current) use of systemic steroids: Secondary | ICD-10-CM | POA: Diagnosis not present

## 2020-06-28 DIAGNOSIS — R4182 Altered mental status, unspecified: Secondary | ICD-10-CM | POA: Diagnosis not present

## 2020-06-28 DIAGNOSIS — J441 Chronic obstructive pulmonary disease with (acute) exacerbation: Secondary | ICD-10-CM | POA: Diagnosis not present

## 2020-06-28 DIAGNOSIS — R410 Disorientation, unspecified: Secondary | ICD-10-CM | POA: Diagnosis not present

## 2020-06-28 DIAGNOSIS — J9621 Acute and chronic respiratory failure with hypoxia: Secondary | ICD-10-CM | POA: Diagnosis not present

## 2020-06-28 DIAGNOSIS — R6520 Severe sepsis without septic shock: Secondary | ICD-10-CM | POA: Diagnosis not present

## 2020-06-28 DIAGNOSIS — I4891 Unspecified atrial fibrillation: Secondary | ICD-10-CM | POA: Diagnosis present

## 2020-06-28 DIAGNOSIS — J159 Unspecified bacterial pneumonia: Secondary | ICD-10-CM | POA: Diagnosis not present

## 2020-06-28 DIAGNOSIS — Z9981 Dependence on supplemental oxygen: Secondary | ICD-10-CM | POA: Diagnosis not present

## 2020-06-28 DIAGNOSIS — Z79899 Other long term (current) drug therapy: Secondary | ICD-10-CM | POA: Diagnosis not present

## 2020-06-28 DIAGNOSIS — Z9081 Acquired absence of spleen: Secondary | ICD-10-CM | POA: Diagnosis not present

## 2020-06-28 DIAGNOSIS — J189 Pneumonia, unspecified organism: Secondary | ICD-10-CM | POA: Diagnosis not present

## 2020-06-28 DIAGNOSIS — G9341 Metabolic encephalopathy: Secondary | ICD-10-CM | POA: Diagnosis not present

## 2020-06-28 DIAGNOSIS — A419 Sepsis, unspecified organism: Secondary | ICD-10-CM | POA: Diagnosis not present

## 2020-07-02 DIAGNOSIS — Z7952 Long term (current) use of systemic steroids: Secondary | ICD-10-CM | POA: Diagnosis not present

## 2020-07-02 DIAGNOSIS — I4891 Unspecified atrial fibrillation: Secondary | ICD-10-CM | POA: Diagnosis not present

## 2020-07-02 DIAGNOSIS — J439 Emphysema, unspecified: Secondary | ICD-10-CM | POA: Diagnosis not present

## 2020-07-02 DIAGNOSIS — Z792 Long term (current) use of antibiotics: Secondary | ICD-10-CM | POA: Diagnosis not present

## 2020-07-02 DIAGNOSIS — Z9981 Dependence on supplemental oxygen: Secondary | ICD-10-CM | POA: Diagnosis not present

## 2020-07-02 DIAGNOSIS — Z8673 Personal history of transient ischemic attack (TIA), and cerebral infarction without residual deficits: Secondary | ICD-10-CM | POA: Diagnosis not present

## 2020-07-02 DIAGNOSIS — D509 Iron deficiency anemia, unspecified: Secondary | ICD-10-CM | POA: Diagnosis not present

## 2020-07-02 DIAGNOSIS — J9621 Acute and chronic respiratory failure with hypoxia: Secondary | ICD-10-CM | POA: Diagnosis not present

## 2020-07-02 DIAGNOSIS — J159 Unspecified bacterial pneumonia: Secondary | ICD-10-CM | POA: Diagnosis not present

## 2020-07-02 DIAGNOSIS — K219 Gastro-esophageal reflux disease without esophagitis: Secondary | ICD-10-CM | POA: Diagnosis not present

## 2020-07-02 DIAGNOSIS — I2510 Atherosclerotic heart disease of native coronary artery without angina pectoris: Secondary | ICD-10-CM | POA: Diagnosis not present

## 2020-07-02 DIAGNOSIS — I252 Old myocardial infarction: Secondary | ICD-10-CM | POA: Diagnosis not present

## 2020-07-02 DIAGNOSIS — Z79899 Other long term (current) drug therapy: Secondary | ICD-10-CM | POA: Diagnosis not present

## 2020-07-02 DIAGNOSIS — I10 Essential (primary) hypertension: Secondary | ICD-10-CM | POA: Diagnosis not present

## 2020-07-02 DIAGNOSIS — E039 Hypothyroidism, unspecified: Secondary | ICD-10-CM | POA: Diagnosis not present

## 2020-07-02 DIAGNOSIS — J9622 Acute and chronic respiratory failure with hypercapnia: Secondary | ICD-10-CM | POA: Diagnosis not present

## 2020-07-02 DIAGNOSIS — F17210 Nicotine dependence, cigarettes, uncomplicated: Secondary | ICD-10-CM | POA: Diagnosis not present

## 2020-07-02 DIAGNOSIS — E119 Type 2 diabetes mellitus without complications: Secondary | ICD-10-CM | POA: Diagnosis not present

## 2020-07-02 DIAGNOSIS — G9341 Metabolic encephalopathy: Secondary | ICD-10-CM | POA: Diagnosis not present

## 2020-07-02 DIAGNOSIS — Z794 Long term (current) use of insulin: Secondary | ICD-10-CM | POA: Diagnosis not present

## 2020-07-08 DIAGNOSIS — E1159 Type 2 diabetes mellitus with other circulatory complications: Secondary | ICD-10-CM | POA: Diagnosis not present

## 2020-07-08 DIAGNOSIS — J9691 Respiratory failure, unspecified with hypoxia: Secondary | ICD-10-CM | POA: Diagnosis not present

## 2020-07-08 DIAGNOSIS — M545 Low back pain, unspecified: Secondary | ICD-10-CM | POA: Diagnosis not present

## 2020-07-08 DIAGNOSIS — R413 Other amnesia: Secondary | ICD-10-CM | POA: Diagnosis not present

## 2020-07-08 DIAGNOSIS — E1140 Type 2 diabetes mellitus with diabetic neuropathy, unspecified: Secondary | ICD-10-CM | POA: Diagnosis not present

## 2020-07-08 DIAGNOSIS — I679 Cerebrovascular disease, unspecified: Secondary | ICD-10-CM | POA: Diagnosis not present

## 2020-07-08 DIAGNOSIS — I2699 Other pulmonary embolism without acute cor pulmonale: Secondary | ICD-10-CM | POA: Diagnosis not present

## 2020-07-08 DIAGNOSIS — D692 Other nonthrombocytopenic purpura: Secondary | ICD-10-CM | POA: Diagnosis not present

## 2020-07-08 DIAGNOSIS — J449 Chronic obstructive pulmonary disease, unspecified: Secondary | ICD-10-CM | POA: Diagnosis not present

## 2020-07-08 DIAGNOSIS — K5909 Other constipation: Secondary | ICD-10-CM | POA: Diagnosis not present

## 2020-07-08 DIAGNOSIS — G8929 Other chronic pain: Secondary | ICD-10-CM | POA: Diagnosis not present

## 2020-07-08 DIAGNOSIS — E291 Testicular hypofunction: Secondary | ICD-10-CM | POA: Diagnosis not present

## 2020-07-09 DIAGNOSIS — J159 Unspecified bacterial pneumonia: Secondary | ICD-10-CM | POA: Diagnosis not present

## 2020-07-09 DIAGNOSIS — J9621 Acute and chronic respiratory failure with hypoxia: Secondary | ICD-10-CM | POA: Diagnosis not present

## 2020-07-09 DIAGNOSIS — E119 Type 2 diabetes mellitus without complications: Secondary | ICD-10-CM | POA: Diagnosis not present

## 2020-07-09 DIAGNOSIS — J9622 Acute and chronic respiratory failure with hypercapnia: Secondary | ICD-10-CM | POA: Diagnosis not present

## 2020-07-09 DIAGNOSIS — D509 Iron deficiency anemia, unspecified: Secondary | ICD-10-CM | POA: Diagnosis not present

## 2020-07-09 DIAGNOSIS — I4891 Unspecified atrial fibrillation: Secondary | ICD-10-CM | POA: Diagnosis not present

## 2020-07-14 DIAGNOSIS — D509 Iron deficiency anemia, unspecified: Secondary | ICD-10-CM | POA: Diagnosis not present

## 2020-07-14 DIAGNOSIS — E119 Type 2 diabetes mellitus without complications: Secondary | ICD-10-CM | POA: Diagnosis not present

## 2020-07-14 DIAGNOSIS — J9621 Acute and chronic respiratory failure with hypoxia: Secondary | ICD-10-CM | POA: Diagnosis not present

## 2020-07-14 DIAGNOSIS — J159 Unspecified bacterial pneumonia: Secondary | ICD-10-CM | POA: Diagnosis not present

## 2020-07-14 DIAGNOSIS — I4891 Unspecified atrial fibrillation: Secondary | ICD-10-CM | POA: Diagnosis not present

## 2020-07-14 DIAGNOSIS — J9622 Acute and chronic respiratory failure with hypercapnia: Secondary | ICD-10-CM | POA: Diagnosis not present

## 2020-07-21 DIAGNOSIS — E119 Type 2 diabetes mellitus without complications: Secondary | ICD-10-CM | POA: Diagnosis not present

## 2020-07-21 DIAGNOSIS — D509 Iron deficiency anemia, unspecified: Secondary | ICD-10-CM | POA: Diagnosis not present

## 2020-07-21 DIAGNOSIS — I4891 Unspecified atrial fibrillation: Secondary | ICD-10-CM | POA: Diagnosis not present

## 2020-07-21 DIAGNOSIS — J9622 Acute and chronic respiratory failure with hypercapnia: Secondary | ICD-10-CM | POA: Diagnosis not present

## 2020-07-21 DIAGNOSIS — J9621 Acute and chronic respiratory failure with hypoxia: Secondary | ICD-10-CM | POA: Diagnosis not present

## 2020-07-21 DIAGNOSIS — J159 Unspecified bacterial pneumonia: Secondary | ICD-10-CM | POA: Diagnosis not present

## 2020-07-22 DIAGNOSIS — E1159 Type 2 diabetes mellitus with other circulatory complications: Secondary | ICD-10-CM | POA: Diagnosis not present

## 2020-07-22 DIAGNOSIS — J9691 Respiratory failure, unspecified with hypoxia: Secondary | ICD-10-CM | POA: Diagnosis not present

## 2020-07-22 DIAGNOSIS — D692 Other nonthrombocytopenic purpura: Secondary | ICD-10-CM | POA: Diagnosis not present

## 2020-07-22 DIAGNOSIS — M545 Low back pain, unspecified: Secondary | ICD-10-CM | POA: Diagnosis not present

## 2020-07-22 DIAGNOSIS — J449 Chronic obstructive pulmonary disease, unspecified: Secondary | ICD-10-CM | POA: Diagnosis not present

## 2020-07-22 DIAGNOSIS — I2699 Other pulmonary embolism without acute cor pulmonale: Secondary | ICD-10-CM | POA: Diagnosis not present

## 2020-07-22 DIAGNOSIS — E291 Testicular hypofunction: Secondary | ICD-10-CM | POA: Diagnosis not present

## 2020-07-22 DIAGNOSIS — K5909 Other constipation: Secondary | ICD-10-CM | POA: Diagnosis not present

## 2020-07-22 DIAGNOSIS — I679 Cerebrovascular disease, unspecified: Secondary | ICD-10-CM | POA: Diagnosis not present

## 2020-07-22 DIAGNOSIS — R413 Other amnesia: Secondary | ICD-10-CM | POA: Diagnosis not present

## 2020-07-22 DIAGNOSIS — G8929 Other chronic pain: Secondary | ICD-10-CM | POA: Diagnosis not present

## 2020-07-22 DIAGNOSIS — E1140 Type 2 diabetes mellitus with diabetic neuropathy, unspecified: Secondary | ICD-10-CM | POA: Diagnosis not present

## 2020-07-28 DIAGNOSIS — D509 Iron deficiency anemia, unspecified: Secondary | ICD-10-CM | POA: Diagnosis not present

## 2020-07-28 DIAGNOSIS — J9621 Acute and chronic respiratory failure with hypoxia: Secondary | ICD-10-CM | POA: Diagnosis not present

## 2020-07-28 DIAGNOSIS — J9622 Acute and chronic respiratory failure with hypercapnia: Secondary | ICD-10-CM | POA: Diagnosis not present

## 2020-07-28 DIAGNOSIS — J159 Unspecified bacterial pneumonia: Secondary | ICD-10-CM | POA: Diagnosis not present

## 2020-07-28 DIAGNOSIS — E119 Type 2 diabetes mellitus without complications: Secondary | ICD-10-CM | POA: Diagnosis not present

## 2020-07-28 DIAGNOSIS — I4891 Unspecified atrial fibrillation: Secondary | ICD-10-CM | POA: Diagnosis not present

## 2020-08-01 DIAGNOSIS — J159 Unspecified bacterial pneumonia: Secondary | ICD-10-CM | POA: Diagnosis not present

## 2020-08-01 DIAGNOSIS — D509 Iron deficiency anemia, unspecified: Secondary | ICD-10-CM | POA: Diagnosis not present

## 2020-08-01 DIAGNOSIS — I4891 Unspecified atrial fibrillation: Secondary | ICD-10-CM | POA: Diagnosis not present

## 2020-08-01 DIAGNOSIS — E039 Hypothyroidism, unspecified: Secondary | ICD-10-CM | POA: Diagnosis not present

## 2020-08-01 DIAGNOSIS — Z7952 Long term (current) use of systemic steroids: Secondary | ICD-10-CM | POA: Diagnosis not present

## 2020-08-01 DIAGNOSIS — E119 Type 2 diabetes mellitus without complications: Secondary | ICD-10-CM | POA: Diagnosis not present

## 2020-08-01 DIAGNOSIS — Z8673 Personal history of transient ischemic attack (TIA), and cerebral infarction without residual deficits: Secondary | ICD-10-CM | POA: Diagnosis not present

## 2020-08-01 DIAGNOSIS — I2510 Atherosclerotic heart disease of native coronary artery without angina pectoris: Secondary | ICD-10-CM | POA: Diagnosis not present

## 2020-08-01 DIAGNOSIS — Z79899 Other long term (current) drug therapy: Secondary | ICD-10-CM | POA: Diagnosis not present

## 2020-08-01 DIAGNOSIS — Z794 Long term (current) use of insulin: Secondary | ICD-10-CM | POA: Diagnosis not present

## 2020-08-01 DIAGNOSIS — J9621 Acute and chronic respiratory failure with hypoxia: Secondary | ICD-10-CM | POA: Diagnosis not present

## 2020-08-01 DIAGNOSIS — I252 Old myocardial infarction: Secondary | ICD-10-CM | POA: Diagnosis not present

## 2020-08-01 DIAGNOSIS — Z9981 Dependence on supplemental oxygen: Secondary | ICD-10-CM | POA: Diagnosis not present

## 2020-08-01 DIAGNOSIS — Z792 Long term (current) use of antibiotics: Secondary | ICD-10-CM | POA: Diagnosis not present

## 2020-08-01 DIAGNOSIS — K219 Gastro-esophageal reflux disease without esophagitis: Secondary | ICD-10-CM | POA: Diagnosis not present

## 2020-08-01 DIAGNOSIS — F17210 Nicotine dependence, cigarettes, uncomplicated: Secondary | ICD-10-CM | POA: Diagnosis not present

## 2020-08-01 DIAGNOSIS — J9622 Acute and chronic respiratory failure with hypercapnia: Secondary | ICD-10-CM | POA: Diagnosis not present

## 2020-08-01 DIAGNOSIS — G9341 Metabolic encephalopathy: Secondary | ICD-10-CM | POA: Diagnosis not present

## 2020-08-01 DIAGNOSIS — I10 Essential (primary) hypertension: Secondary | ICD-10-CM | POA: Diagnosis not present

## 2020-08-01 DIAGNOSIS — J439 Emphysema, unspecified: Secondary | ICD-10-CM | POA: Diagnosis not present

## 2020-08-05 DIAGNOSIS — J9622 Acute and chronic respiratory failure with hypercapnia: Secondary | ICD-10-CM | POA: Diagnosis not present

## 2020-08-05 DIAGNOSIS — D509 Iron deficiency anemia, unspecified: Secondary | ICD-10-CM | POA: Diagnosis not present

## 2020-08-05 DIAGNOSIS — J159 Unspecified bacterial pneumonia: Secondary | ICD-10-CM | POA: Diagnosis not present

## 2020-08-05 DIAGNOSIS — J9621 Acute and chronic respiratory failure with hypoxia: Secondary | ICD-10-CM | POA: Diagnosis not present

## 2020-08-05 DIAGNOSIS — I4891 Unspecified atrial fibrillation: Secondary | ICD-10-CM | POA: Diagnosis not present

## 2020-08-05 DIAGNOSIS — E119 Type 2 diabetes mellitus without complications: Secondary | ICD-10-CM | POA: Diagnosis not present

## 2020-08-06 DIAGNOSIS — E039 Hypothyroidism, unspecified: Secondary | ICD-10-CM | POA: Diagnosis not present

## 2020-08-06 DIAGNOSIS — K5909 Other constipation: Secondary | ICD-10-CM | POA: Diagnosis not present

## 2020-08-06 DIAGNOSIS — E1140 Type 2 diabetes mellitus with diabetic neuropathy, unspecified: Secondary | ICD-10-CM | POA: Diagnosis not present

## 2020-08-06 DIAGNOSIS — J449 Chronic obstructive pulmonary disease, unspecified: Secondary | ICD-10-CM | POA: Diagnosis not present

## 2020-08-06 DIAGNOSIS — J9691 Respiratory failure, unspecified with hypoxia: Secondary | ICD-10-CM | POA: Diagnosis not present

## 2020-08-06 DIAGNOSIS — D692 Other nonthrombocytopenic purpura: Secondary | ICD-10-CM | POA: Diagnosis not present

## 2020-08-06 DIAGNOSIS — K863 Pseudocyst of pancreas: Secondary | ICD-10-CM | POA: Diagnosis not present

## 2020-08-06 DIAGNOSIS — E1159 Type 2 diabetes mellitus with other circulatory complications: Secondary | ICD-10-CM | POA: Diagnosis not present

## 2020-08-06 DIAGNOSIS — E291 Testicular hypofunction: Secondary | ICD-10-CM | POA: Diagnosis not present

## 2020-08-06 DIAGNOSIS — R413 Other amnesia: Secondary | ICD-10-CM | POA: Diagnosis not present

## 2020-08-06 DIAGNOSIS — I2699 Other pulmonary embolism without acute cor pulmonale: Secondary | ICD-10-CM | POA: Diagnosis not present

## 2020-08-06 DIAGNOSIS — I679 Cerebrovascular disease, unspecified: Secondary | ICD-10-CM | POA: Diagnosis not present

## 2020-08-06 DIAGNOSIS — R1013 Epigastric pain: Secondary | ICD-10-CM | POA: Diagnosis not present

## 2020-08-10 DIAGNOSIS — I4891 Unspecified atrial fibrillation: Secondary | ICD-10-CM | POA: Diagnosis not present

## 2020-08-10 DIAGNOSIS — J159 Unspecified bacterial pneumonia: Secondary | ICD-10-CM | POA: Diagnosis not present

## 2020-08-10 DIAGNOSIS — E119 Type 2 diabetes mellitus without complications: Secondary | ICD-10-CM | POA: Diagnosis not present

## 2020-08-10 DIAGNOSIS — J9621 Acute and chronic respiratory failure with hypoxia: Secondary | ICD-10-CM | POA: Diagnosis not present

## 2020-08-10 DIAGNOSIS — J9622 Acute and chronic respiratory failure with hypercapnia: Secondary | ICD-10-CM | POA: Diagnosis not present

## 2020-08-10 DIAGNOSIS — D509 Iron deficiency anemia, unspecified: Secondary | ICD-10-CM | POA: Diagnosis not present

## 2020-08-17 DIAGNOSIS — J9621 Acute and chronic respiratory failure with hypoxia: Secondary | ICD-10-CM | POA: Diagnosis not present

## 2020-08-17 DIAGNOSIS — J159 Unspecified bacterial pneumonia: Secondary | ICD-10-CM | POA: Diagnosis not present

## 2020-08-17 DIAGNOSIS — E119 Type 2 diabetes mellitus without complications: Secondary | ICD-10-CM | POA: Diagnosis not present

## 2020-08-17 DIAGNOSIS — D509 Iron deficiency anemia, unspecified: Secondary | ICD-10-CM | POA: Diagnosis not present

## 2020-08-17 DIAGNOSIS — J9622 Acute and chronic respiratory failure with hypercapnia: Secondary | ICD-10-CM | POA: Diagnosis not present

## 2020-08-17 DIAGNOSIS — I4891 Unspecified atrial fibrillation: Secondary | ICD-10-CM | POA: Diagnosis not present

## 2020-08-20 DIAGNOSIS — D692 Other nonthrombocytopenic purpura: Secondary | ICD-10-CM | POA: Diagnosis not present

## 2020-08-20 DIAGNOSIS — E1140 Type 2 diabetes mellitus with diabetic neuropathy, unspecified: Secondary | ICD-10-CM | POA: Diagnosis not present

## 2020-08-20 DIAGNOSIS — J9691 Respiratory failure, unspecified with hypoxia: Secondary | ICD-10-CM | POA: Diagnosis not present

## 2020-08-20 DIAGNOSIS — J449 Chronic obstructive pulmonary disease, unspecified: Secondary | ICD-10-CM | POA: Diagnosis not present

## 2020-08-20 DIAGNOSIS — E1159 Type 2 diabetes mellitus with other circulatory complications: Secondary | ICD-10-CM | POA: Diagnosis not present

## 2020-08-20 DIAGNOSIS — R413 Other amnesia: Secondary | ICD-10-CM | POA: Diagnosis not present

## 2020-08-20 DIAGNOSIS — K5909 Other constipation: Secondary | ICD-10-CM | POA: Diagnosis not present

## 2020-08-20 DIAGNOSIS — I2699 Other pulmonary embolism without acute cor pulmonale: Secondary | ICD-10-CM | POA: Diagnosis not present

## 2020-08-20 DIAGNOSIS — G8929 Other chronic pain: Secondary | ICD-10-CM | POA: Diagnosis not present

## 2020-08-20 DIAGNOSIS — M545 Low back pain, unspecified: Secondary | ICD-10-CM | POA: Diagnosis not present

## 2020-08-20 DIAGNOSIS — E291 Testicular hypofunction: Secondary | ICD-10-CM | POA: Diagnosis not present

## 2020-08-20 DIAGNOSIS — Z87891 Personal history of nicotine dependence: Secondary | ICD-10-CM | POA: Diagnosis not present

## 2020-08-26 DIAGNOSIS — I4891 Unspecified atrial fibrillation: Secondary | ICD-10-CM | POA: Diagnosis not present

## 2020-08-26 DIAGNOSIS — D509 Iron deficiency anemia, unspecified: Secondary | ICD-10-CM | POA: Diagnosis not present

## 2020-08-26 DIAGNOSIS — J9621 Acute and chronic respiratory failure with hypoxia: Secondary | ICD-10-CM | POA: Diagnosis not present

## 2020-08-26 DIAGNOSIS — J9622 Acute and chronic respiratory failure with hypercapnia: Secondary | ICD-10-CM | POA: Diagnosis not present

## 2020-08-26 DIAGNOSIS — J159 Unspecified bacterial pneumonia: Secondary | ICD-10-CM | POA: Diagnosis not present

## 2020-08-26 DIAGNOSIS — E119 Type 2 diabetes mellitus without complications: Secondary | ICD-10-CM | POA: Diagnosis not present

## 2020-09-17 DIAGNOSIS — Z87891 Personal history of nicotine dependence: Secondary | ICD-10-CM | POA: Diagnosis not present

## 2020-09-17 DIAGNOSIS — J9691 Respiratory failure, unspecified with hypoxia: Secondary | ICD-10-CM | POA: Diagnosis not present

## 2020-09-17 DIAGNOSIS — M545 Low back pain, unspecified: Secondary | ICD-10-CM | POA: Diagnosis not present

## 2020-09-17 DIAGNOSIS — K5909 Other constipation: Secondary | ICD-10-CM | POA: Diagnosis not present

## 2020-09-17 DIAGNOSIS — E291 Testicular hypofunction: Secondary | ICD-10-CM | POA: Diagnosis not present

## 2020-09-17 DIAGNOSIS — G8929 Other chronic pain: Secondary | ICD-10-CM | POA: Diagnosis not present

## 2020-09-17 DIAGNOSIS — I10 Essential (primary) hypertension: Secondary | ICD-10-CM | POA: Diagnosis not present

## 2020-09-17 DIAGNOSIS — I2699 Other pulmonary embolism without acute cor pulmonale: Secondary | ICD-10-CM | POA: Diagnosis not present

## 2020-09-17 DIAGNOSIS — E1159 Type 2 diabetes mellitus with other circulatory complications: Secondary | ICD-10-CM | POA: Diagnosis not present

## 2020-09-17 DIAGNOSIS — D692 Other nonthrombocytopenic purpura: Secondary | ICD-10-CM | POA: Diagnosis not present

## 2020-09-17 DIAGNOSIS — J449 Chronic obstructive pulmonary disease, unspecified: Secondary | ICD-10-CM | POA: Diagnosis not present

## 2020-09-17 DIAGNOSIS — E1140 Type 2 diabetes mellitus with diabetic neuropathy, unspecified: Secondary | ICD-10-CM | POA: Diagnosis not present

## 2020-09-17 DIAGNOSIS — R413 Other amnesia: Secondary | ICD-10-CM | POA: Diagnosis not present

## 2020-10-22 DIAGNOSIS — D692 Other nonthrombocytopenic purpura: Secondary | ICD-10-CM | POA: Diagnosis not present

## 2020-10-22 DIAGNOSIS — R413 Other amnesia: Secondary | ICD-10-CM | POA: Diagnosis not present

## 2020-10-22 DIAGNOSIS — E1159 Type 2 diabetes mellitus with other circulatory complications: Secondary | ICD-10-CM | POA: Diagnosis not present

## 2020-10-22 DIAGNOSIS — E291 Testicular hypofunction: Secondary | ICD-10-CM | POA: Diagnosis not present

## 2020-10-22 DIAGNOSIS — J449 Chronic obstructive pulmonary disease, unspecified: Secondary | ICD-10-CM | POA: Diagnosis not present

## 2020-10-22 DIAGNOSIS — J9691 Respiratory failure, unspecified with hypoxia: Secondary | ICD-10-CM | POA: Diagnosis not present

## 2020-10-22 DIAGNOSIS — I679 Cerebrovascular disease, unspecified: Secondary | ICD-10-CM | POA: Diagnosis not present

## 2020-10-22 DIAGNOSIS — M545 Low back pain, unspecified: Secondary | ICD-10-CM | POA: Diagnosis not present

## 2020-10-22 DIAGNOSIS — E1140 Type 2 diabetes mellitus with diabetic neuropathy, unspecified: Secondary | ICD-10-CM | POA: Diagnosis not present

## 2020-10-22 DIAGNOSIS — G8929 Other chronic pain: Secondary | ICD-10-CM | POA: Diagnosis not present

## 2020-10-22 DIAGNOSIS — K5909 Other constipation: Secondary | ICD-10-CM | POA: Diagnosis not present

## 2020-10-22 DIAGNOSIS — I2699 Other pulmonary embolism without acute cor pulmonale: Secondary | ICD-10-CM | POA: Diagnosis not present

## 2020-11-05 DIAGNOSIS — J9622 Acute and chronic respiratory failure with hypercapnia: Secondary | ICD-10-CM | POA: Diagnosis not present

## 2020-11-05 DIAGNOSIS — E1140 Type 2 diabetes mellitus with diabetic neuropathy, unspecified: Secondary | ICD-10-CM | POA: Diagnosis not present

## 2020-11-05 DIAGNOSIS — I252 Old myocardial infarction: Secondary | ICD-10-CM | POA: Diagnosis not present

## 2020-11-05 DIAGNOSIS — I2510 Atherosclerotic heart disease of native coronary artery without angina pectoris: Secondary | ICD-10-CM | POA: Diagnosis not present

## 2020-11-05 DIAGNOSIS — Z9981 Dependence on supplemental oxygen: Secondary | ICD-10-CM | POA: Diagnosis not present

## 2020-11-05 DIAGNOSIS — R0602 Shortness of breath: Secondary | ICD-10-CM | POA: Diagnosis not present

## 2020-11-05 DIAGNOSIS — E039 Hypothyroidism, unspecified: Secondary | ICD-10-CM | POA: Diagnosis not present

## 2020-11-05 DIAGNOSIS — J181 Lobar pneumonia, unspecified organism: Secondary | ICD-10-CM | POA: Diagnosis not present

## 2020-11-05 DIAGNOSIS — R0689 Other abnormalities of breathing: Secondary | ICD-10-CM | POA: Diagnosis not present

## 2020-11-05 DIAGNOSIS — J9621 Acute and chronic respiratory failure with hypoxia: Secondary | ICD-10-CM | POA: Diagnosis not present

## 2020-11-05 DIAGNOSIS — J439 Emphysema, unspecified: Secondary | ICD-10-CM | POA: Diagnosis not present

## 2020-11-05 DIAGNOSIS — K219 Gastro-esophageal reflux disease without esophagitis: Secondary | ICD-10-CM | POA: Diagnosis not present

## 2020-11-05 DIAGNOSIS — J159 Unspecified bacterial pneumonia: Secondary | ICD-10-CM | POA: Diagnosis not present

## 2020-11-05 DIAGNOSIS — K5900 Constipation, unspecified: Secondary | ICD-10-CM | POA: Diagnosis not present

## 2020-11-05 DIAGNOSIS — K4090 Unilateral inguinal hernia, without obstruction or gangrene, not specified as recurrent: Secondary | ICD-10-CM | POA: Diagnosis not present

## 2020-11-05 DIAGNOSIS — M545 Low back pain, unspecified: Secondary | ICD-10-CM | POA: Diagnosis not present

## 2020-11-05 DIAGNOSIS — R0789 Other chest pain: Secondary | ICD-10-CM | POA: Diagnosis not present

## 2020-11-05 DIAGNOSIS — R062 Wheezing: Secondary | ICD-10-CM | POA: Diagnosis not present

## 2020-11-05 DIAGNOSIS — G8929 Other chronic pain: Secondary | ICD-10-CM | POA: Diagnosis not present

## 2020-11-05 DIAGNOSIS — D72829 Elevated white blood cell count, unspecified: Secondary | ICD-10-CM | POA: Diagnosis not present

## 2020-11-05 DIAGNOSIS — J441 Chronic obstructive pulmonary disease with (acute) exacerbation: Secondary | ICD-10-CM | POA: Diagnosis not present

## 2020-11-05 DIAGNOSIS — R231 Pallor: Secondary | ICD-10-CM | POA: Diagnosis not present

## 2020-11-05 DIAGNOSIS — F17210 Nicotine dependence, cigarettes, uncomplicated: Secondary | ICD-10-CM | POA: Diagnosis not present

## 2020-11-05 DIAGNOSIS — J440 Chronic obstructive pulmonary disease with acute lower respiratory infection: Secondary | ICD-10-CM | POA: Diagnosis not present

## 2020-11-05 DIAGNOSIS — Z79899 Other long term (current) drug therapy: Secondary | ICD-10-CM | POA: Diagnosis not present

## 2020-11-05 DIAGNOSIS — R079 Chest pain, unspecified: Secondary | ICD-10-CM | POA: Diagnosis not present

## 2020-11-05 DIAGNOSIS — K5903 Drug induced constipation: Secondary | ICD-10-CM | POA: Diagnosis not present

## 2020-11-05 DIAGNOSIS — I700 Atherosclerosis of aorta: Secondary | ICD-10-CM | POA: Diagnosis not present

## 2020-11-05 DIAGNOSIS — J432 Centrilobular emphysema: Secondary | ICD-10-CM | POA: Diagnosis not present

## 2020-11-05 DIAGNOSIS — Z716 Tobacco abuse counseling: Secondary | ICD-10-CM | POA: Diagnosis not present

## 2020-11-05 DIAGNOSIS — Z8673 Personal history of transient ischemic attack (TIA), and cerebral infarction without residual deficits: Secondary | ICD-10-CM | POA: Diagnosis not present

## 2020-11-05 DIAGNOSIS — I4891 Unspecified atrial fibrillation: Secondary | ICD-10-CM | POA: Diagnosis not present

## 2020-11-05 DIAGNOSIS — Z7952 Long term (current) use of systemic steroids: Secondary | ICD-10-CM | POA: Diagnosis not present

## 2020-11-06 DIAGNOSIS — J9622 Acute and chronic respiratory failure with hypercapnia: Secondary | ICD-10-CM | POA: Diagnosis not present

## 2020-11-06 DIAGNOSIS — J159 Unspecified bacterial pneumonia: Secondary | ICD-10-CM | POA: Diagnosis not present

## 2020-11-06 DIAGNOSIS — J9621 Acute and chronic respiratory failure with hypoxia: Secondary | ICD-10-CM | POA: Diagnosis not present

## 2020-11-07 DIAGNOSIS — J9622 Acute and chronic respiratory failure with hypercapnia: Secondary | ICD-10-CM | POA: Diagnosis not present

## 2020-11-07 DIAGNOSIS — J9621 Acute and chronic respiratory failure with hypoxia: Secondary | ICD-10-CM | POA: Diagnosis not present

## 2020-11-07 DIAGNOSIS — J159 Unspecified bacterial pneumonia: Secondary | ICD-10-CM | POA: Diagnosis not present

## 2020-11-08 DIAGNOSIS — J9621 Acute and chronic respiratory failure with hypoxia: Secondary | ICD-10-CM | POA: Diagnosis not present

## 2020-11-08 DIAGNOSIS — J159 Unspecified bacterial pneumonia: Secondary | ICD-10-CM | POA: Diagnosis not present

## 2020-11-08 DIAGNOSIS — J9622 Acute and chronic respiratory failure with hypercapnia: Secondary | ICD-10-CM | POA: Diagnosis not present

## 2020-11-12 DIAGNOSIS — J449 Chronic obstructive pulmonary disease, unspecified: Secondary | ICD-10-CM | POA: Diagnosis not present

## 2020-11-12 DIAGNOSIS — R413 Other amnesia: Secondary | ICD-10-CM | POA: Diagnosis not present

## 2020-11-12 DIAGNOSIS — E1159 Type 2 diabetes mellitus with other circulatory complications: Secondary | ICD-10-CM | POA: Diagnosis not present

## 2020-11-12 DIAGNOSIS — J189 Pneumonia, unspecified organism: Secondary | ICD-10-CM | POA: Diagnosis not present

## 2020-11-12 DIAGNOSIS — E291 Testicular hypofunction: Secondary | ICD-10-CM | POA: Diagnosis not present

## 2020-11-12 DIAGNOSIS — J9691 Respiratory failure, unspecified with hypoxia: Secondary | ICD-10-CM | POA: Diagnosis not present

## 2020-11-12 DIAGNOSIS — E1140 Type 2 diabetes mellitus with diabetic neuropathy, unspecified: Secondary | ICD-10-CM | POA: Diagnosis not present

## 2020-11-12 DIAGNOSIS — R1013 Epigastric pain: Secondary | ICD-10-CM | POA: Diagnosis not present

## 2020-11-12 DIAGNOSIS — D692 Other nonthrombocytopenic purpura: Secondary | ICD-10-CM | POA: Diagnosis not present

## 2020-11-12 DIAGNOSIS — K5909 Other constipation: Secondary | ICD-10-CM | POA: Diagnosis not present

## 2020-11-12 DIAGNOSIS — I2699 Other pulmonary embolism without acute cor pulmonale: Secondary | ICD-10-CM | POA: Diagnosis not present

## 2020-11-12 DIAGNOSIS — I679 Cerebrovascular disease, unspecified: Secondary | ICD-10-CM | POA: Diagnosis not present

## 2020-11-19 DIAGNOSIS — E291 Testicular hypofunction: Secondary | ICD-10-CM | POA: Diagnosis not present

## 2020-11-19 DIAGNOSIS — J449 Chronic obstructive pulmonary disease, unspecified: Secondary | ICD-10-CM | POA: Diagnosis not present

## 2020-11-19 DIAGNOSIS — E1140 Type 2 diabetes mellitus with diabetic neuropathy, unspecified: Secondary | ICD-10-CM | POA: Diagnosis not present

## 2020-11-19 DIAGNOSIS — J9691 Respiratory failure, unspecified with hypoxia: Secondary | ICD-10-CM | POA: Diagnosis not present

## 2020-11-19 DIAGNOSIS — M545 Low back pain, unspecified: Secondary | ICD-10-CM | POA: Diagnosis not present

## 2020-11-19 DIAGNOSIS — G8929 Other chronic pain: Secondary | ICD-10-CM | POA: Diagnosis not present

## 2020-11-19 DIAGNOSIS — I10 Essential (primary) hypertension: Secondary | ICD-10-CM | POA: Diagnosis not present

## 2020-11-19 DIAGNOSIS — K5909 Other constipation: Secondary | ICD-10-CM | POA: Diagnosis not present

## 2020-11-19 DIAGNOSIS — M25551 Pain in right hip: Secondary | ICD-10-CM | POA: Diagnosis not present

## 2020-11-19 DIAGNOSIS — I2699 Other pulmonary embolism without acute cor pulmonale: Secondary | ICD-10-CM | POA: Diagnosis not present

## 2020-11-19 DIAGNOSIS — R413 Other amnesia: Secondary | ICD-10-CM | POA: Diagnosis not present

## 2020-11-19 DIAGNOSIS — E1159 Type 2 diabetes mellitus with other circulatory complications: Secondary | ICD-10-CM | POA: Diagnosis not present

## 2020-11-27 DIAGNOSIS — Z9181 History of falling: Secondary | ICD-10-CM | POA: Diagnosis not present

## 2020-11-27 DIAGNOSIS — Z0000 Encounter for general adult medical examination without abnormal findings: Secondary | ICD-10-CM | POA: Diagnosis not present

## 2020-11-27 DIAGNOSIS — Z1331 Encounter for screening for depression: Secondary | ICD-10-CM | POA: Diagnosis not present

## 2020-11-27 DIAGNOSIS — E785 Hyperlipidemia, unspecified: Secondary | ICD-10-CM | POA: Diagnosis not present

## 2020-12-03 DIAGNOSIS — I10 Essential (primary) hypertension: Secondary | ICD-10-CM | POA: Diagnosis not present

## 2020-12-03 DIAGNOSIS — M545 Low back pain, unspecified: Secondary | ICD-10-CM | POA: Diagnosis not present

## 2020-12-03 DIAGNOSIS — G8929 Other chronic pain: Secondary | ICD-10-CM | POA: Diagnosis not present

## 2020-12-03 DIAGNOSIS — E1159 Type 2 diabetes mellitus with other circulatory complications: Secondary | ICD-10-CM | POA: Diagnosis not present

## 2020-12-03 DIAGNOSIS — I2699 Other pulmonary embolism without acute cor pulmonale: Secondary | ICD-10-CM | POA: Diagnosis not present

## 2020-12-03 DIAGNOSIS — E1140 Type 2 diabetes mellitus with diabetic neuropathy, unspecified: Secondary | ICD-10-CM | POA: Diagnosis not present

## 2020-12-03 DIAGNOSIS — E291 Testicular hypofunction: Secondary | ICD-10-CM | POA: Diagnosis not present

## 2020-12-03 DIAGNOSIS — Z87891 Personal history of nicotine dependence: Secondary | ICD-10-CM | POA: Diagnosis not present

## 2020-12-03 DIAGNOSIS — M25551 Pain in right hip: Secondary | ICD-10-CM | POA: Diagnosis not present

## 2020-12-03 DIAGNOSIS — J449 Chronic obstructive pulmonary disease, unspecified: Secondary | ICD-10-CM | POA: Diagnosis not present

## 2020-12-03 DIAGNOSIS — K5909 Other constipation: Secondary | ICD-10-CM | POA: Diagnosis not present

## 2020-12-03 DIAGNOSIS — J9691 Respiratory failure, unspecified with hypoxia: Secondary | ICD-10-CM | POA: Diagnosis not present

## 2020-12-31 DIAGNOSIS — I10 Essential (primary) hypertension: Secondary | ICD-10-CM | POA: Diagnosis not present

## 2020-12-31 DIAGNOSIS — E1159 Type 2 diabetes mellitus with other circulatory complications: Secondary | ICD-10-CM | POA: Diagnosis not present

## 2020-12-31 DIAGNOSIS — K5909 Other constipation: Secondary | ICD-10-CM | POA: Diagnosis not present

## 2020-12-31 DIAGNOSIS — E291 Testicular hypofunction: Secondary | ICD-10-CM | POA: Diagnosis not present

## 2020-12-31 DIAGNOSIS — R413 Other amnesia: Secondary | ICD-10-CM | POA: Diagnosis not present

## 2020-12-31 DIAGNOSIS — J9691 Respiratory failure, unspecified with hypoxia: Secondary | ICD-10-CM | POA: Diagnosis not present

## 2020-12-31 DIAGNOSIS — J449 Chronic obstructive pulmonary disease, unspecified: Secondary | ICD-10-CM | POA: Diagnosis not present

## 2020-12-31 DIAGNOSIS — G8929 Other chronic pain: Secondary | ICD-10-CM | POA: Diagnosis not present

## 2020-12-31 DIAGNOSIS — M545 Low back pain, unspecified: Secondary | ICD-10-CM | POA: Diagnosis not present

## 2020-12-31 DIAGNOSIS — E1140 Type 2 diabetes mellitus with diabetic neuropathy, unspecified: Secondary | ICD-10-CM | POA: Diagnosis not present

## 2020-12-31 DIAGNOSIS — I2699 Other pulmonary embolism without acute cor pulmonale: Secondary | ICD-10-CM | POA: Diagnosis not present

## 2020-12-31 DIAGNOSIS — D692 Other nonthrombocytopenic purpura: Secondary | ICD-10-CM | POA: Diagnosis not present

## 2021-01-06 DIAGNOSIS — E1021 Type 1 diabetes mellitus with diabetic nephropathy: Secondary | ICD-10-CM | POA: Diagnosis not present

## 2021-01-08 DIAGNOSIS — E1021 Type 1 diabetes mellitus with diabetic nephropathy: Secondary | ICD-10-CM | POA: Diagnosis not present

## 2021-01-28 DIAGNOSIS — E291 Testicular hypofunction: Secondary | ICD-10-CM | POA: Diagnosis not present

## 2021-01-28 DIAGNOSIS — D692 Other nonthrombocytopenic purpura: Secondary | ICD-10-CM | POA: Diagnosis not present

## 2021-01-28 DIAGNOSIS — J449 Chronic obstructive pulmonary disease, unspecified: Secondary | ICD-10-CM | POA: Diagnosis not present

## 2021-01-28 DIAGNOSIS — J9691 Respiratory failure, unspecified with hypoxia: Secondary | ICD-10-CM | POA: Diagnosis not present

## 2021-01-28 DIAGNOSIS — E1140 Type 2 diabetes mellitus with diabetic neuropathy, unspecified: Secondary | ICD-10-CM | POA: Diagnosis not present

## 2021-01-28 DIAGNOSIS — G8929 Other chronic pain: Secondary | ICD-10-CM | POA: Diagnosis not present

## 2021-01-28 DIAGNOSIS — I10 Essential (primary) hypertension: Secondary | ICD-10-CM | POA: Diagnosis not present

## 2021-01-28 DIAGNOSIS — K5909 Other constipation: Secondary | ICD-10-CM | POA: Diagnosis not present

## 2021-01-28 DIAGNOSIS — M545 Low back pain, unspecified: Secondary | ICD-10-CM | POA: Diagnosis not present

## 2021-01-28 DIAGNOSIS — I2699 Other pulmonary embolism without acute cor pulmonale: Secondary | ICD-10-CM | POA: Diagnosis not present

## 2021-01-28 DIAGNOSIS — E1159 Type 2 diabetes mellitus with other circulatory complications: Secondary | ICD-10-CM | POA: Diagnosis not present

## 2021-01-28 DIAGNOSIS — R413 Other amnesia: Secondary | ICD-10-CM | POA: Diagnosis not present

## 2021-02-04 DIAGNOSIS — Z20822 Contact with and (suspected) exposure to covid-19: Secondary | ICD-10-CM | POA: Diagnosis not present

## 2021-02-08 DIAGNOSIS — E1021 Type 1 diabetes mellitus with diabetic nephropathy: Secondary | ICD-10-CM | POA: Diagnosis not present

## 2021-03-01 DIAGNOSIS — I2699 Other pulmonary embolism without acute cor pulmonale: Secondary | ICD-10-CM | POA: Diagnosis not present

## 2021-03-01 DIAGNOSIS — K5909 Other constipation: Secondary | ICD-10-CM | POA: Diagnosis not present

## 2021-03-01 DIAGNOSIS — R413 Other amnesia: Secondary | ICD-10-CM | POA: Diagnosis not present

## 2021-03-01 DIAGNOSIS — G8929 Other chronic pain: Secondary | ICD-10-CM | POA: Diagnosis not present

## 2021-03-01 DIAGNOSIS — J449 Chronic obstructive pulmonary disease, unspecified: Secondary | ICD-10-CM | POA: Diagnosis not present

## 2021-03-01 DIAGNOSIS — J9691 Respiratory failure, unspecified with hypoxia: Secondary | ICD-10-CM | POA: Diagnosis not present

## 2021-03-01 DIAGNOSIS — Z23 Encounter for immunization: Secondary | ICD-10-CM | POA: Diagnosis not present

## 2021-03-01 DIAGNOSIS — E1159 Type 2 diabetes mellitus with other circulatory complications: Secondary | ICD-10-CM | POA: Diagnosis not present

## 2021-03-01 DIAGNOSIS — E1140 Type 2 diabetes mellitus with diabetic neuropathy, unspecified: Secondary | ICD-10-CM | POA: Diagnosis not present

## 2021-03-01 DIAGNOSIS — E291 Testicular hypofunction: Secondary | ICD-10-CM | POA: Diagnosis not present

## 2021-03-01 DIAGNOSIS — M545 Low back pain, unspecified: Secondary | ICD-10-CM | POA: Diagnosis not present

## 2021-03-01 DIAGNOSIS — I10 Essential (primary) hypertension: Secondary | ICD-10-CM | POA: Diagnosis not present

## 2021-03-10 DIAGNOSIS — E1021 Type 1 diabetes mellitus with diabetic nephropathy: Secondary | ICD-10-CM | POA: Diagnosis not present

## 2021-03-11 DIAGNOSIS — E109 Type 1 diabetes mellitus without complications: Secondary | ICD-10-CM | POA: Diagnosis not present

## 2021-03-29 DIAGNOSIS — J069 Acute upper respiratory infection, unspecified: Secondary | ICD-10-CM | POA: Diagnosis not present

## 2021-03-29 DIAGNOSIS — Z6826 Body mass index (BMI) 26.0-26.9, adult: Secondary | ICD-10-CM | POA: Diagnosis not present

## 2021-03-29 DIAGNOSIS — E1159 Type 2 diabetes mellitus with other circulatory complications: Secondary | ICD-10-CM | POA: Diagnosis not present

## 2021-03-29 DIAGNOSIS — I10 Essential (primary) hypertension: Secondary | ICD-10-CM | POA: Diagnosis not present

## 2021-04-10 DIAGNOSIS — E1021 Type 1 diabetes mellitus with diabetic nephropathy: Secondary | ICD-10-CM | POA: Diagnosis not present

## 2021-04-11 DIAGNOSIS — E109 Type 1 diabetes mellitus without complications: Secondary | ICD-10-CM | POA: Diagnosis not present

## 2021-04-19 DIAGNOSIS — E1140 Type 2 diabetes mellitus with diabetic neuropathy, unspecified: Secondary | ICD-10-CM | POA: Diagnosis not present

## 2021-04-19 DIAGNOSIS — G8929 Other chronic pain: Secondary | ICD-10-CM | POA: Diagnosis not present

## 2021-04-19 DIAGNOSIS — M545 Low back pain, unspecified: Secondary | ICD-10-CM | POA: Diagnosis not present

## 2021-04-19 DIAGNOSIS — J449 Chronic obstructive pulmonary disease, unspecified: Secondary | ICD-10-CM | POA: Diagnosis not present

## 2021-04-19 DIAGNOSIS — R413 Other amnesia: Secondary | ICD-10-CM | POA: Diagnosis not present

## 2021-04-19 DIAGNOSIS — E291 Testicular hypofunction: Secondary | ICD-10-CM | POA: Diagnosis not present

## 2021-04-19 DIAGNOSIS — I10 Essential (primary) hypertension: Secondary | ICD-10-CM | POA: Diagnosis not present

## 2021-04-19 DIAGNOSIS — E1159 Type 2 diabetes mellitus with other circulatory complications: Secondary | ICD-10-CM | POA: Diagnosis not present

## 2021-04-19 DIAGNOSIS — K5909 Other constipation: Secondary | ICD-10-CM | POA: Diagnosis not present

## 2021-04-19 DIAGNOSIS — D692 Other nonthrombocytopenic purpura: Secondary | ICD-10-CM | POA: Diagnosis not present

## 2021-04-19 DIAGNOSIS — I2699 Other pulmonary embolism without acute cor pulmonale: Secondary | ICD-10-CM | POA: Diagnosis not present

## 2021-04-19 DIAGNOSIS — J9691 Respiratory failure, unspecified with hypoxia: Secondary | ICD-10-CM | POA: Diagnosis not present

## 2021-05-11 DIAGNOSIS — E1021 Type 1 diabetes mellitus with diabetic nephropathy: Secondary | ICD-10-CM | POA: Diagnosis not present

## 2021-05-12 DIAGNOSIS — E109 Type 1 diabetes mellitus without complications: Secondary | ICD-10-CM | POA: Diagnosis not present

## 2021-05-17 DIAGNOSIS — I2699 Other pulmonary embolism without acute cor pulmonale: Secondary | ICD-10-CM | POA: Diagnosis not present

## 2021-05-17 DIAGNOSIS — R413 Other amnesia: Secondary | ICD-10-CM | POA: Diagnosis not present

## 2021-05-17 DIAGNOSIS — M545 Low back pain, unspecified: Secondary | ICD-10-CM | POA: Diagnosis not present

## 2021-05-17 DIAGNOSIS — J449 Chronic obstructive pulmonary disease, unspecified: Secondary | ICD-10-CM | POA: Diagnosis not present

## 2021-05-17 DIAGNOSIS — E1140 Type 2 diabetes mellitus with diabetic neuropathy, unspecified: Secondary | ICD-10-CM | POA: Diagnosis not present

## 2021-05-17 DIAGNOSIS — J9691 Respiratory failure, unspecified with hypoxia: Secondary | ICD-10-CM | POA: Diagnosis not present

## 2021-05-17 DIAGNOSIS — E1159 Type 2 diabetes mellitus with other circulatory complications: Secondary | ICD-10-CM | POA: Diagnosis not present

## 2021-05-17 DIAGNOSIS — E291 Testicular hypofunction: Secondary | ICD-10-CM | POA: Diagnosis not present

## 2021-05-17 DIAGNOSIS — K5909 Other constipation: Secondary | ICD-10-CM | POA: Diagnosis not present

## 2021-05-17 DIAGNOSIS — I10 Essential (primary) hypertension: Secondary | ICD-10-CM | POA: Diagnosis not present

## 2021-05-17 DIAGNOSIS — G8929 Other chronic pain: Secondary | ICD-10-CM | POA: Diagnosis not present

## 2021-05-17 DIAGNOSIS — D692 Other nonthrombocytopenic purpura: Secondary | ICD-10-CM | POA: Diagnosis not present

## 2021-06-05 DIAGNOSIS — J440 Chronic obstructive pulmonary disease with acute lower respiratory infection: Secondary | ICD-10-CM | POA: Diagnosis not present

## 2021-06-05 DIAGNOSIS — Z7982 Long term (current) use of aspirin: Secondary | ICD-10-CM | POA: Diagnosis not present

## 2021-06-05 DIAGNOSIS — E119 Type 2 diabetes mellitus without complications: Secondary | ICD-10-CM | POA: Diagnosis not present

## 2021-06-05 DIAGNOSIS — J189 Pneumonia, unspecified organism: Secondary | ICD-10-CM | POA: Diagnosis not present

## 2021-06-05 DIAGNOSIS — Z91041 Radiographic dye allergy status: Secondary | ICD-10-CM | POA: Diagnosis not present

## 2021-06-05 DIAGNOSIS — E1021 Type 1 diabetes mellitus with diabetic nephropathy: Secondary | ICD-10-CM | POA: Diagnosis not present

## 2021-06-05 DIAGNOSIS — J441 Chronic obstructive pulmonary disease with (acute) exacerbation: Secondary | ICD-10-CM | POA: Diagnosis not present

## 2021-06-05 DIAGNOSIS — I10 Essential (primary) hypertension: Secondary | ICD-10-CM | POA: Diagnosis not present

## 2021-06-05 DIAGNOSIS — F17210 Nicotine dependence, cigarettes, uncomplicated: Secondary | ICD-10-CM | POA: Diagnosis not present

## 2021-06-05 DIAGNOSIS — J9622 Acute and chronic respiratory failure with hypercapnia: Secondary | ICD-10-CM | POA: Diagnosis not present

## 2021-06-05 DIAGNOSIS — Z792 Long term (current) use of antibiotics: Secondary | ICD-10-CM | POA: Diagnosis not present

## 2021-06-05 DIAGNOSIS — E109 Type 1 diabetes mellitus without complications: Secondary | ICD-10-CM | POA: Diagnosis not present

## 2021-06-05 DIAGNOSIS — Z7984 Long term (current) use of oral hypoglycemic drugs: Secondary | ICD-10-CM | POA: Diagnosis not present

## 2021-06-05 DIAGNOSIS — Z794 Long term (current) use of insulin: Secondary | ICD-10-CM | POA: Diagnosis not present

## 2021-06-05 DIAGNOSIS — K219 Gastro-esophageal reflux disease without esophagitis: Secondary | ICD-10-CM | POA: Diagnosis not present

## 2021-06-05 DIAGNOSIS — M542 Cervicalgia: Secondary | ICD-10-CM | POA: Diagnosis not present

## 2021-06-05 DIAGNOSIS — Z79899 Other long term (current) drug therapy: Secondary | ICD-10-CM | POA: Diagnosis not present

## 2021-06-05 DIAGNOSIS — R079 Chest pain, unspecified: Secondary | ICD-10-CM | POA: Diagnosis not present

## 2021-06-05 DIAGNOSIS — E875 Hyperkalemia: Secondary | ICD-10-CM | POA: Diagnosis not present

## 2021-06-05 DIAGNOSIS — R062 Wheezing: Secondary | ICD-10-CM | POA: Diagnosis not present

## 2021-06-05 DIAGNOSIS — R0602 Shortness of breath: Secondary | ICD-10-CM | POA: Diagnosis not present

## 2021-06-05 DIAGNOSIS — R0902 Hypoxemia: Secondary | ICD-10-CM | POA: Diagnosis not present

## 2021-06-05 DIAGNOSIS — I252 Old myocardial infarction: Secondary | ICD-10-CM | POA: Diagnosis not present

## 2021-06-05 DIAGNOSIS — G4733 Obstructive sleep apnea (adult) (pediatric): Secondary | ICD-10-CM | POA: Diagnosis not present

## 2021-06-05 DIAGNOSIS — Z8673 Personal history of transient ischemic attack (TIA), and cerebral infarction without residual deficits: Secondary | ICD-10-CM | POA: Diagnosis not present

## 2021-06-05 DIAGNOSIS — E039 Hypothyroidism, unspecified: Secondary | ICD-10-CM | POA: Diagnosis not present

## 2021-06-05 DIAGNOSIS — I4891 Unspecified atrial fibrillation: Secondary | ICD-10-CM | POA: Diagnosis not present

## 2021-06-05 DIAGNOSIS — Z23 Encounter for immunization: Secondary | ICD-10-CM | POA: Diagnosis not present

## 2021-06-05 DIAGNOSIS — J159 Unspecified bacterial pneumonia: Secondary | ICD-10-CM | POA: Diagnosis not present

## 2021-06-05 DIAGNOSIS — J9621 Acute and chronic respiratory failure with hypoxia: Secondary | ICD-10-CM | POA: Diagnosis not present

## 2021-06-05 DIAGNOSIS — Z885 Allergy status to narcotic agent status: Secondary | ICD-10-CM | POA: Diagnosis not present

## 2021-06-05 DIAGNOSIS — E871 Hypo-osmolality and hyponatremia: Secondary | ICD-10-CM | POA: Diagnosis not present

## 2021-06-05 DIAGNOSIS — Z7952 Long term (current) use of systemic steroids: Secondary | ICD-10-CM | POA: Diagnosis not present

## 2021-06-08 DIAGNOSIS — E1021 Type 1 diabetes mellitus with diabetic nephropathy: Secondary | ICD-10-CM | POA: Diagnosis not present

## 2021-06-11 DIAGNOSIS — E109 Type 1 diabetes mellitus without complications: Secondary | ICD-10-CM | POA: Diagnosis not present

## 2021-06-18 DIAGNOSIS — E1159 Type 2 diabetes mellitus with other circulatory complications: Secondary | ICD-10-CM | POA: Diagnosis not present

## 2021-06-18 DIAGNOSIS — I10 Essential (primary) hypertension: Secondary | ICD-10-CM | POA: Diagnosis not present

## 2021-06-18 DIAGNOSIS — G8929 Other chronic pain: Secondary | ICD-10-CM | POA: Diagnosis not present

## 2021-06-18 DIAGNOSIS — E291 Testicular hypofunction: Secondary | ICD-10-CM | POA: Diagnosis not present

## 2021-06-18 DIAGNOSIS — I2699 Other pulmonary embolism without acute cor pulmonale: Secondary | ICD-10-CM | POA: Diagnosis not present

## 2021-06-18 DIAGNOSIS — E871 Hypo-osmolality and hyponatremia: Secondary | ICD-10-CM | POA: Diagnosis not present

## 2021-06-18 DIAGNOSIS — J189 Pneumonia, unspecified organism: Secondary | ICD-10-CM | POA: Diagnosis not present

## 2021-06-18 DIAGNOSIS — K5909 Other constipation: Secondary | ICD-10-CM | POA: Diagnosis not present

## 2021-06-18 DIAGNOSIS — M545 Low back pain, unspecified: Secondary | ICD-10-CM | POA: Diagnosis not present

## 2021-06-18 DIAGNOSIS — E1140 Type 2 diabetes mellitus with diabetic neuropathy, unspecified: Secondary | ICD-10-CM | POA: Diagnosis not present

## 2021-06-18 DIAGNOSIS — J449 Chronic obstructive pulmonary disease, unspecified: Secondary | ICD-10-CM | POA: Diagnosis not present

## 2021-06-25 DIAGNOSIS — E1159 Type 2 diabetes mellitus with other circulatory complications: Secondary | ICD-10-CM | POA: Diagnosis not present

## 2021-06-25 DIAGNOSIS — D72829 Elevated white blood cell count, unspecified: Secondary | ICD-10-CM | POA: Diagnosis not present

## 2021-06-25 DIAGNOSIS — J449 Chronic obstructive pulmonary disease, unspecified: Secondary | ICD-10-CM | POA: Diagnosis not present

## 2021-06-25 DIAGNOSIS — I10 Essential (primary) hypertension: Secondary | ICD-10-CM | POA: Diagnosis not present

## 2021-06-25 DIAGNOSIS — E871 Hypo-osmolality and hyponatremia: Secondary | ICD-10-CM | POA: Diagnosis not present

## 2021-06-25 DIAGNOSIS — M545 Low back pain, unspecified: Secondary | ICD-10-CM | POA: Diagnosis not present

## 2021-06-25 DIAGNOSIS — I2699 Other pulmonary embolism without acute cor pulmonale: Secondary | ICD-10-CM | POA: Diagnosis not present

## 2021-06-25 DIAGNOSIS — K5909 Other constipation: Secondary | ICD-10-CM | POA: Diagnosis not present

## 2021-06-25 DIAGNOSIS — E1140 Type 2 diabetes mellitus with diabetic neuropathy, unspecified: Secondary | ICD-10-CM | POA: Diagnosis not present

## 2021-06-25 DIAGNOSIS — E875 Hyperkalemia: Secondary | ICD-10-CM | POA: Diagnosis not present

## 2021-06-25 DIAGNOSIS — G8929 Other chronic pain: Secondary | ICD-10-CM | POA: Diagnosis not present

## 2021-07-09 DIAGNOSIS — J449 Chronic obstructive pulmonary disease, unspecified: Secondary | ICD-10-CM | POA: Diagnosis not present

## 2021-07-09 DIAGNOSIS — E1021 Type 1 diabetes mellitus with diabetic nephropathy: Secondary | ICD-10-CM | POA: Diagnosis not present

## 2021-07-09 DIAGNOSIS — G8929 Other chronic pain: Secondary | ICD-10-CM | POA: Diagnosis not present

## 2021-07-09 DIAGNOSIS — D72829 Elevated white blood cell count, unspecified: Secondary | ICD-10-CM | POA: Diagnosis not present

## 2021-07-09 DIAGNOSIS — I2699 Other pulmonary embolism without acute cor pulmonale: Secondary | ICD-10-CM | POA: Diagnosis not present

## 2021-07-09 DIAGNOSIS — E871 Hypo-osmolality and hyponatremia: Secondary | ICD-10-CM | POA: Diagnosis not present

## 2021-07-09 DIAGNOSIS — I10 Essential (primary) hypertension: Secondary | ICD-10-CM | POA: Diagnosis not present

## 2021-07-09 DIAGNOSIS — M545 Low back pain, unspecified: Secondary | ICD-10-CM | POA: Diagnosis not present

## 2021-07-09 DIAGNOSIS — E039 Hypothyroidism, unspecified: Secondary | ICD-10-CM | POA: Diagnosis not present

## 2021-07-09 DIAGNOSIS — E1159 Type 2 diabetes mellitus with other circulatory complications: Secondary | ICD-10-CM | POA: Diagnosis not present

## 2021-07-09 DIAGNOSIS — E875 Hyperkalemia: Secondary | ICD-10-CM | POA: Diagnosis not present

## 2021-07-09 DIAGNOSIS — E1140 Type 2 diabetes mellitus with diabetic neuropathy, unspecified: Secondary | ICD-10-CM | POA: Diagnosis not present

## 2021-07-09 DIAGNOSIS — K5909 Other constipation: Secondary | ICD-10-CM | POA: Diagnosis not present

## 2021-07-11 DIAGNOSIS — E109 Type 1 diabetes mellitus without complications: Secondary | ICD-10-CM | POA: Diagnosis not present

## 2021-08-08 DIAGNOSIS — E1021 Type 1 diabetes mellitus with diabetic nephropathy: Secondary | ICD-10-CM | POA: Diagnosis not present

## 2021-08-09 DIAGNOSIS — E1140 Type 2 diabetes mellitus with diabetic neuropathy, unspecified: Secondary | ICD-10-CM | POA: Diagnosis not present

## 2021-08-09 DIAGNOSIS — J449 Chronic obstructive pulmonary disease, unspecified: Secondary | ICD-10-CM | POA: Diagnosis not present

## 2021-08-09 DIAGNOSIS — I10 Essential (primary) hypertension: Secondary | ICD-10-CM | POA: Diagnosis not present

## 2021-08-09 DIAGNOSIS — I2699 Other pulmonary embolism without acute cor pulmonale: Secondary | ICD-10-CM | POA: Diagnosis not present

## 2021-08-09 DIAGNOSIS — E1159 Type 2 diabetes mellitus with other circulatory complications: Secondary | ICD-10-CM | POA: Diagnosis not present

## 2021-08-09 DIAGNOSIS — G8929 Other chronic pain: Secondary | ICD-10-CM | POA: Diagnosis not present

## 2021-08-09 DIAGNOSIS — E871 Hypo-osmolality and hyponatremia: Secondary | ICD-10-CM | POA: Diagnosis not present

## 2021-08-09 DIAGNOSIS — E875 Hyperkalemia: Secondary | ICD-10-CM | POA: Diagnosis not present

## 2021-08-09 DIAGNOSIS — K5909 Other constipation: Secondary | ICD-10-CM | POA: Diagnosis not present

## 2021-08-09 DIAGNOSIS — M545 Low back pain, unspecified: Secondary | ICD-10-CM | POA: Diagnosis not present

## 2021-08-09 DIAGNOSIS — D72829 Elevated white blood cell count, unspecified: Secondary | ICD-10-CM | POA: Diagnosis not present

## 2021-08-10 DIAGNOSIS — E109 Type 1 diabetes mellitus without complications: Secondary | ICD-10-CM | POA: Diagnosis not present

## 2021-08-17 DIAGNOSIS — E875 Hyperkalemia: Secondary | ICD-10-CM | POA: Diagnosis not present

## 2021-08-17 DIAGNOSIS — E871 Hypo-osmolality and hyponatremia: Secondary | ICD-10-CM | POA: Diagnosis not present

## 2021-08-17 DIAGNOSIS — J0100 Acute maxillary sinusitis, unspecified: Secondary | ICD-10-CM | POA: Diagnosis not present

## 2021-08-17 DIAGNOSIS — E1140 Type 2 diabetes mellitus with diabetic neuropathy, unspecified: Secondary | ICD-10-CM | POA: Diagnosis not present

## 2021-08-17 DIAGNOSIS — D72829 Elevated white blood cell count, unspecified: Secondary | ICD-10-CM | POA: Diagnosis not present

## 2021-08-17 DIAGNOSIS — Z6828 Body mass index (BMI) 28.0-28.9, adult: Secondary | ICD-10-CM | POA: Diagnosis not present

## 2021-08-17 DIAGNOSIS — J449 Chronic obstructive pulmonary disease, unspecified: Secondary | ICD-10-CM | POA: Diagnosis not present

## 2021-08-17 DIAGNOSIS — M545 Low back pain, unspecified: Secondary | ICD-10-CM | POA: Diagnosis not present

## 2021-08-17 DIAGNOSIS — I10 Essential (primary) hypertension: Secondary | ICD-10-CM | POA: Diagnosis not present

## 2021-08-17 DIAGNOSIS — G8929 Other chronic pain: Secondary | ICD-10-CM | POA: Diagnosis not present

## 2021-08-17 DIAGNOSIS — E1159 Type 2 diabetes mellitus with other circulatory complications: Secondary | ICD-10-CM | POA: Diagnosis not present

## 2021-09-03 DIAGNOSIS — J449 Chronic obstructive pulmonary disease, unspecified: Secondary | ICD-10-CM | POA: Diagnosis not present

## 2021-09-03 DIAGNOSIS — M545 Low back pain, unspecified: Secondary | ICD-10-CM | POA: Diagnosis not present

## 2021-09-03 DIAGNOSIS — I2699 Other pulmonary embolism without acute cor pulmonale: Secondary | ICD-10-CM | POA: Diagnosis not present

## 2021-09-03 DIAGNOSIS — E871 Hypo-osmolality and hyponatremia: Secondary | ICD-10-CM | POA: Diagnosis not present

## 2021-09-03 DIAGNOSIS — D72829 Elevated white blood cell count, unspecified: Secondary | ICD-10-CM | POA: Diagnosis not present

## 2021-09-03 DIAGNOSIS — E1159 Type 2 diabetes mellitus with other circulatory complications: Secondary | ICD-10-CM | POA: Diagnosis not present

## 2021-09-03 DIAGNOSIS — E875 Hyperkalemia: Secondary | ICD-10-CM | POA: Diagnosis not present

## 2021-09-03 DIAGNOSIS — E1140 Type 2 diabetes mellitus with diabetic neuropathy, unspecified: Secondary | ICD-10-CM | POA: Diagnosis not present

## 2021-09-03 DIAGNOSIS — K5909 Other constipation: Secondary | ICD-10-CM | POA: Diagnosis not present

## 2021-09-03 DIAGNOSIS — G8929 Other chronic pain: Secondary | ICD-10-CM | POA: Diagnosis not present

## 2021-09-03 DIAGNOSIS — I10 Essential (primary) hypertension: Secondary | ICD-10-CM | POA: Diagnosis not present

## 2021-10-09 DIAGNOSIS — I2699 Other pulmonary embolism without acute cor pulmonale: Secondary | ICD-10-CM | POA: Diagnosis not present

## 2021-10-09 DIAGNOSIS — Z139 Encounter for screening, unspecified: Secondary | ICD-10-CM | POA: Diagnosis not present

## 2021-10-09 DIAGNOSIS — R413 Other amnesia: Secondary | ICD-10-CM | POA: Diagnosis not present

## 2021-10-09 DIAGNOSIS — M25511 Pain in right shoulder: Secondary | ICD-10-CM | POA: Diagnosis not present

## 2021-10-09 DIAGNOSIS — D692 Other nonthrombocytopenic purpura: Secondary | ICD-10-CM | POA: Diagnosis not present

## 2021-10-09 DIAGNOSIS — E1140 Type 2 diabetes mellitus with diabetic neuropathy, unspecified: Secondary | ICD-10-CM | POA: Diagnosis not present

## 2021-10-09 DIAGNOSIS — J9691 Respiratory failure, unspecified with hypoxia: Secondary | ICD-10-CM | POA: Diagnosis not present

## 2021-10-09 DIAGNOSIS — I679 Cerebrovascular disease, unspecified: Secondary | ICD-10-CM | POA: Diagnosis not present

## 2021-10-09 DIAGNOSIS — K5909 Other constipation: Secondary | ICD-10-CM | POA: Diagnosis not present

## 2021-10-09 DIAGNOSIS — E1159 Type 2 diabetes mellitus with other circulatory complications: Secondary | ICD-10-CM | POA: Diagnosis not present

## 2021-10-09 DIAGNOSIS — E291 Testicular hypofunction: Secondary | ICD-10-CM | POA: Diagnosis not present

## 2021-10-09 DIAGNOSIS — J449 Chronic obstructive pulmonary disease, unspecified: Secondary | ICD-10-CM | POA: Diagnosis not present

## 2021-11-05 DIAGNOSIS — E291 Testicular hypofunction: Secondary | ICD-10-CM | POA: Diagnosis not present

## 2021-11-05 DIAGNOSIS — J9691 Respiratory failure, unspecified with hypoxia: Secondary | ICD-10-CM | POA: Diagnosis not present

## 2021-11-05 DIAGNOSIS — K5909 Other constipation: Secondary | ICD-10-CM | POA: Diagnosis not present

## 2021-11-05 DIAGNOSIS — Z139 Encounter for screening, unspecified: Secondary | ICD-10-CM | POA: Diagnosis not present

## 2021-11-05 DIAGNOSIS — J449 Chronic obstructive pulmonary disease, unspecified: Secondary | ICD-10-CM | POA: Diagnosis not present

## 2021-11-05 DIAGNOSIS — J208 Acute bronchitis due to other specified organisms: Secondary | ICD-10-CM | POA: Diagnosis not present

## 2021-11-05 DIAGNOSIS — R413 Other amnesia: Secondary | ICD-10-CM | POA: Diagnosis not present

## 2021-11-05 DIAGNOSIS — D692 Other nonthrombocytopenic purpura: Secondary | ICD-10-CM | POA: Diagnosis not present

## 2021-11-05 DIAGNOSIS — E1140 Type 2 diabetes mellitus with diabetic neuropathy, unspecified: Secondary | ICD-10-CM | POA: Diagnosis not present

## 2021-11-05 DIAGNOSIS — E1159 Type 2 diabetes mellitus with other circulatory complications: Secondary | ICD-10-CM | POA: Diagnosis not present

## 2021-11-05 DIAGNOSIS — I679 Cerebrovascular disease, unspecified: Secondary | ICD-10-CM | POA: Diagnosis not present

## 2021-11-05 DIAGNOSIS — I2699 Other pulmonary embolism without acute cor pulmonale: Secondary | ICD-10-CM | POA: Diagnosis not present

## 2021-11-17 DIAGNOSIS — I2699 Other pulmonary embolism without acute cor pulmonale: Secondary | ICD-10-CM | POA: Diagnosis not present

## 2021-11-17 DIAGNOSIS — D72829 Elevated white blood cell count, unspecified: Secondary | ICD-10-CM | POA: Diagnosis not present

## 2021-11-17 DIAGNOSIS — Z6827 Body mass index (BMI) 27.0-27.9, adult: Secondary | ICD-10-CM | POA: Diagnosis not present

## 2021-11-17 DIAGNOSIS — M545 Low back pain, unspecified: Secondary | ICD-10-CM | POA: Diagnosis not present

## 2021-11-17 DIAGNOSIS — E871 Hypo-osmolality and hyponatremia: Secondary | ICD-10-CM | POA: Diagnosis not present

## 2021-11-17 DIAGNOSIS — I10 Essential (primary) hypertension: Secondary | ICD-10-CM | POA: Diagnosis not present

## 2021-11-17 DIAGNOSIS — G8929 Other chronic pain: Secondary | ICD-10-CM | POA: Diagnosis not present

## 2021-11-17 DIAGNOSIS — E1140 Type 2 diabetes mellitus with diabetic neuropathy, unspecified: Secondary | ICD-10-CM | POA: Diagnosis not present

## 2021-11-17 DIAGNOSIS — J449 Chronic obstructive pulmonary disease, unspecified: Secondary | ICD-10-CM | POA: Diagnosis not present

## 2021-11-17 DIAGNOSIS — E1159 Type 2 diabetes mellitus with other circulatory complications: Secondary | ICD-10-CM | POA: Diagnosis not present

## 2021-11-17 DIAGNOSIS — E875 Hyperkalemia: Secondary | ICD-10-CM | POA: Diagnosis not present

## 2021-12-03 DIAGNOSIS — E1140 Type 2 diabetes mellitus with diabetic neuropathy, unspecified: Secondary | ICD-10-CM | POA: Diagnosis not present

## 2021-12-03 DIAGNOSIS — B9689 Other specified bacterial agents as the cause of diseases classified elsewhere: Secondary | ICD-10-CM | POA: Diagnosis not present

## 2021-12-03 DIAGNOSIS — E875 Hyperkalemia: Secondary | ICD-10-CM | POA: Diagnosis not present

## 2021-12-03 DIAGNOSIS — E871 Hypo-osmolality and hyponatremia: Secondary | ICD-10-CM | POA: Diagnosis not present

## 2021-12-03 DIAGNOSIS — M545 Low back pain, unspecified: Secondary | ICD-10-CM | POA: Diagnosis not present

## 2021-12-03 DIAGNOSIS — E1159 Type 2 diabetes mellitus with other circulatory complications: Secondary | ICD-10-CM | POA: Diagnosis not present

## 2021-12-03 DIAGNOSIS — J449 Chronic obstructive pulmonary disease, unspecified: Secondary | ICD-10-CM | POA: Diagnosis not present

## 2021-12-03 DIAGNOSIS — E039 Hypothyroidism, unspecified: Secondary | ICD-10-CM | POA: Diagnosis not present

## 2021-12-03 DIAGNOSIS — G8929 Other chronic pain: Secondary | ICD-10-CM | POA: Diagnosis not present

## 2021-12-03 DIAGNOSIS — E291 Testicular hypofunction: Secondary | ICD-10-CM | POA: Diagnosis not present

## 2021-12-03 DIAGNOSIS — R5382 Chronic fatigue, unspecified: Secondary | ICD-10-CM | POA: Diagnosis not present

## 2021-12-03 DIAGNOSIS — J208 Acute bronchitis due to other specified organisms: Secondary | ICD-10-CM | POA: Diagnosis not present

## 2021-12-03 DIAGNOSIS — I10 Essential (primary) hypertension: Secondary | ICD-10-CM | POA: Diagnosis not present

## 2021-12-03 DIAGNOSIS — D72829 Elevated white blood cell count, unspecified: Secondary | ICD-10-CM | POA: Diagnosis not present

## 2021-12-14 DIAGNOSIS — M545 Low back pain, unspecified: Secondary | ICD-10-CM | POA: Diagnosis not present

## 2021-12-14 DIAGNOSIS — Z86711 Personal history of pulmonary embolism: Secondary | ICD-10-CM | POA: Diagnosis not present

## 2021-12-14 DIAGNOSIS — E871 Hypo-osmolality and hyponatremia: Secondary | ICD-10-CM | POA: Diagnosis not present

## 2021-12-14 DIAGNOSIS — E1159 Type 2 diabetes mellitus with other circulatory complications: Secondary | ICD-10-CM | POA: Diagnosis not present

## 2021-12-14 DIAGNOSIS — D72829 Elevated white blood cell count, unspecified: Secondary | ICD-10-CM | POA: Diagnosis not present

## 2021-12-14 DIAGNOSIS — E1140 Type 2 diabetes mellitus with diabetic neuropathy, unspecified: Secondary | ICD-10-CM | POA: Diagnosis not present

## 2021-12-14 DIAGNOSIS — I10 Essential (primary) hypertension: Secondary | ICD-10-CM | POA: Diagnosis not present

## 2021-12-14 DIAGNOSIS — E875 Hyperkalemia: Secondary | ICD-10-CM | POA: Diagnosis not present

## 2021-12-14 DIAGNOSIS — J449 Chronic obstructive pulmonary disease, unspecified: Secondary | ICD-10-CM | POA: Diagnosis not present

## 2021-12-14 DIAGNOSIS — G8929 Other chronic pain: Secondary | ICD-10-CM | POA: Diagnosis not present

## 2021-12-14 DIAGNOSIS — B370 Candidal stomatitis: Secondary | ICD-10-CM | POA: Diagnosis not present

## 2021-12-27 DIAGNOSIS — I252 Old myocardial infarction: Secondary | ICD-10-CM | POA: Diagnosis not present

## 2021-12-27 DIAGNOSIS — Z885 Allergy status to narcotic agent status: Secondary | ICD-10-CM | POA: Diagnosis not present

## 2021-12-27 DIAGNOSIS — Z7982 Long term (current) use of aspirin: Secondary | ICD-10-CM | POA: Diagnosis not present

## 2021-12-27 DIAGNOSIS — E785 Hyperlipidemia, unspecified: Secondary | ICD-10-CM | POA: Diagnosis not present

## 2021-12-27 DIAGNOSIS — Z7984 Long term (current) use of oral hypoglycemic drugs: Secondary | ICD-10-CM | POA: Diagnosis not present

## 2021-12-27 DIAGNOSIS — E039 Hypothyroidism, unspecified: Secondary | ICD-10-CM | POA: Diagnosis not present

## 2021-12-27 DIAGNOSIS — Z91041 Radiographic dye allergy status: Secondary | ICD-10-CM | POA: Diagnosis not present

## 2021-12-27 DIAGNOSIS — G9341 Metabolic encephalopathy: Secondary | ICD-10-CM | POA: Diagnosis not present

## 2021-12-27 DIAGNOSIS — J189 Pneumonia, unspecified organism: Secondary | ICD-10-CM | POA: Diagnosis not present

## 2021-12-27 DIAGNOSIS — Z79891 Long term (current) use of opiate analgesic: Secondary | ICD-10-CM | POA: Diagnosis not present

## 2021-12-27 DIAGNOSIS — G894 Chronic pain syndrome: Secondary | ICD-10-CM | POA: Diagnosis not present

## 2021-12-27 DIAGNOSIS — J9622 Acute and chronic respiratory failure with hypercapnia: Secondary | ICD-10-CM | POA: Diagnosis not present

## 2021-12-27 DIAGNOSIS — R410 Disorientation, unspecified: Secondary | ICD-10-CM | POA: Diagnosis not present

## 2021-12-27 DIAGNOSIS — I4891 Unspecified atrial fibrillation: Secondary | ICD-10-CM | POA: Diagnosis not present

## 2021-12-27 DIAGNOSIS — J440 Chronic obstructive pulmonary disease with acute lower respiratory infection: Secondary | ICD-10-CM | POA: Diagnosis not present

## 2021-12-27 DIAGNOSIS — R109 Unspecified abdominal pain: Secondary | ICD-10-CM | POA: Diagnosis not present

## 2021-12-27 DIAGNOSIS — M1990 Unspecified osteoarthritis, unspecified site: Secondary | ICD-10-CM | POA: Diagnosis not present

## 2021-12-27 DIAGNOSIS — Z79899 Other long term (current) drug therapy: Secondary | ICD-10-CM | POA: Diagnosis not present

## 2021-12-27 DIAGNOSIS — Z794 Long term (current) use of insulin: Secondary | ICD-10-CM | POA: Diagnosis not present

## 2021-12-27 DIAGNOSIS — I959 Hypotension, unspecified: Secondary | ICD-10-CM | POA: Diagnosis not present

## 2021-12-27 DIAGNOSIS — I10 Essential (primary) hypertension: Secondary | ICD-10-CM | POA: Diagnosis not present

## 2021-12-27 DIAGNOSIS — J9621 Acute and chronic respiratory failure with hypoxia: Secondary | ICD-10-CM | POA: Diagnosis not present

## 2021-12-27 DIAGNOSIS — Z86711 Personal history of pulmonary embolism: Secondary | ICD-10-CM | POA: Diagnosis not present

## 2021-12-27 DIAGNOSIS — F17210 Nicotine dependence, cigarettes, uncomplicated: Secondary | ICD-10-CM | POA: Diagnosis not present

## 2021-12-27 DIAGNOSIS — R062 Wheezing: Secondary | ICD-10-CM | POA: Diagnosis not present

## 2021-12-27 DIAGNOSIS — R4182 Altered mental status, unspecified: Secondary | ICD-10-CM | POA: Diagnosis not present

## 2021-12-27 DIAGNOSIS — Z8719 Personal history of other diseases of the digestive system: Secondary | ICD-10-CM | POA: Diagnosis not present

## 2021-12-27 DIAGNOSIS — J441 Chronic obstructive pulmonary disease with (acute) exacerbation: Secondary | ICD-10-CM | POA: Diagnosis not present

## 2021-12-27 DIAGNOSIS — K219 Gastro-esophageal reflux disease without esophagitis: Secondary | ICD-10-CM | POA: Diagnosis not present

## 2021-12-27 DIAGNOSIS — G4733 Obstructive sleep apnea (adult) (pediatric): Secondary | ICD-10-CM | POA: Diagnosis not present

## 2021-12-27 DIAGNOSIS — Z8673 Personal history of transient ischemic attack (TIA), and cerebral infarction without residual deficits: Secondary | ICD-10-CM | POA: Diagnosis not present

## 2022-01-05 DIAGNOSIS — D72829 Elevated white blood cell count, unspecified: Secondary | ICD-10-CM | POA: Diagnosis not present

## 2022-01-05 DIAGNOSIS — F5101 Primary insomnia: Secondary | ICD-10-CM | POA: Diagnosis not present

## 2022-01-05 DIAGNOSIS — I10 Essential (primary) hypertension: Secondary | ICD-10-CM | POA: Diagnosis not present

## 2022-01-05 DIAGNOSIS — E1159 Type 2 diabetes mellitus with other circulatory complications: Secondary | ICD-10-CM | POA: Diagnosis not present

## 2022-01-05 DIAGNOSIS — J189 Pneumonia, unspecified organism: Secondary | ICD-10-CM | POA: Diagnosis not present

## 2022-01-05 DIAGNOSIS — E1140 Type 2 diabetes mellitus with diabetic neuropathy, unspecified: Secondary | ICD-10-CM | POA: Diagnosis not present

## 2022-01-05 DIAGNOSIS — E871 Hypo-osmolality and hyponatremia: Secondary | ICD-10-CM | POA: Diagnosis not present

## 2022-01-05 DIAGNOSIS — G8929 Other chronic pain: Secondary | ICD-10-CM | POA: Diagnosis not present

## 2022-01-05 DIAGNOSIS — J449 Chronic obstructive pulmonary disease, unspecified: Secondary | ICD-10-CM | POA: Diagnosis not present

## 2022-01-05 DIAGNOSIS — M545 Low back pain, unspecified: Secondary | ICD-10-CM | POA: Diagnosis not present

## 2022-01-05 DIAGNOSIS — E875 Hyperkalemia: Secondary | ICD-10-CM | POA: Diagnosis not present

## 2022-01-13 DIAGNOSIS — D72829 Elevated white blood cell count, unspecified: Secondary | ICD-10-CM | POA: Diagnosis not present

## 2022-01-13 DIAGNOSIS — I10 Essential (primary) hypertension: Secondary | ICD-10-CM | POA: Diagnosis not present

## 2022-01-13 DIAGNOSIS — M545 Low back pain, unspecified: Secondary | ICD-10-CM | POA: Diagnosis not present

## 2022-01-13 DIAGNOSIS — J449 Chronic obstructive pulmonary disease, unspecified: Secondary | ICD-10-CM | POA: Diagnosis not present

## 2022-01-13 DIAGNOSIS — E875 Hyperkalemia: Secondary | ICD-10-CM | POA: Diagnosis not present

## 2022-01-13 DIAGNOSIS — Z86711 Personal history of pulmonary embolism: Secondary | ICD-10-CM | POA: Diagnosis not present

## 2022-01-13 DIAGNOSIS — E1159 Type 2 diabetes mellitus with other circulatory complications: Secondary | ICD-10-CM | POA: Diagnosis not present

## 2022-01-13 DIAGNOSIS — E1140 Type 2 diabetes mellitus with diabetic neuropathy, unspecified: Secondary | ICD-10-CM | POA: Diagnosis not present

## 2022-01-13 DIAGNOSIS — E871 Hypo-osmolality and hyponatremia: Secondary | ICD-10-CM | POA: Diagnosis not present

## 2022-01-13 DIAGNOSIS — F5101 Primary insomnia: Secondary | ICD-10-CM | POA: Diagnosis not present

## 2022-01-13 DIAGNOSIS — G8929 Other chronic pain: Secondary | ICD-10-CM | POA: Diagnosis not present

## 2022-01-28 DIAGNOSIS — Z23 Encounter for immunization: Secondary | ICD-10-CM | POA: Diagnosis not present

## 2022-01-28 DIAGNOSIS — F5101 Primary insomnia: Secondary | ICD-10-CM | POA: Diagnosis not present

## 2022-01-28 DIAGNOSIS — E1159 Type 2 diabetes mellitus with other circulatory complications: Secondary | ICD-10-CM | POA: Diagnosis not present

## 2022-01-28 DIAGNOSIS — G8929 Other chronic pain: Secondary | ICD-10-CM | POA: Diagnosis not present

## 2022-01-28 DIAGNOSIS — D72829 Elevated white blood cell count, unspecified: Secondary | ICD-10-CM | POA: Diagnosis not present

## 2022-01-28 DIAGNOSIS — I10 Essential (primary) hypertension: Secondary | ICD-10-CM | POA: Diagnosis not present

## 2022-01-28 DIAGNOSIS — E875 Hyperkalemia: Secondary | ICD-10-CM | POA: Diagnosis not present

## 2022-01-28 DIAGNOSIS — J449 Chronic obstructive pulmonary disease, unspecified: Secondary | ICD-10-CM | POA: Diagnosis not present

## 2022-01-28 DIAGNOSIS — E1140 Type 2 diabetes mellitus with diabetic neuropathy, unspecified: Secondary | ICD-10-CM | POA: Diagnosis not present

## 2022-01-28 DIAGNOSIS — E871 Hypo-osmolality and hyponatremia: Secondary | ICD-10-CM | POA: Diagnosis not present

## 2022-01-28 DIAGNOSIS — M545 Low back pain, unspecified: Secondary | ICD-10-CM | POA: Diagnosis not present

## 2022-02-08 DIAGNOSIS — E1021 Type 1 diabetes mellitus with diabetic nephropathy: Secondary | ICD-10-CM | POA: Diagnosis not present

## 2022-02-25 DIAGNOSIS — D72829 Elevated white blood cell count, unspecified: Secondary | ICD-10-CM | POA: Diagnosis not present

## 2022-02-25 DIAGNOSIS — M545 Low back pain, unspecified: Secondary | ICD-10-CM | POA: Diagnosis not present

## 2022-02-25 DIAGNOSIS — F5101 Primary insomnia: Secondary | ICD-10-CM | POA: Diagnosis not present

## 2022-02-25 DIAGNOSIS — E1159 Type 2 diabetes mellitus with other circulatory complications: Secondary | ICD-10-CM | POA: Diagnosis not present

## 2022-02-25 DIAGNOSIS — Z23 Encounter for immunization: Secondary | ICD-10-CM | POA: Diagnosis not present

## 2022-02-25 DIAGNOSIS — J449 Chronic obstructive pulmonary disease, unspecified: Secondary | ICD-10-CM | POA: Diagnosis not present

## 2022-02-25 DIAGNOSIS — E875 Hyperkalemia: Secondary | ICD-10-CM | POA: Diagnosis not present

## 2022-02-25 DIAGNOSIS — I10 Essential (primary) hypertension: Secondary | ICD-10-CM | POA: Diagnosis not present

## 2022-02-25 DIAGNOSIS — E1140 Type 2 diabetes mellitus with diabetic neuropathy, unspecified: Secondary | ICD-10-CM | POA: Diagnosis not present

## 2022-02-25 DIAGNOSIS — E871 Hypo-osmolality and hyponatremia: Secondary | ICD-10-CM | POA: Diagnosis not present

## 2022-02-25 DIAGNOSIS — G8929 Other chronic pain: Secondary | ICD-10-CM | POA: Diagnosis not present

## 2022-03-10 DIAGNOSIS — E871 Hypo-osmolality and hyponatremia: Secondary | ICD-10-CM | POA: Diagnosis not present

## 2022-03-10 DIAGNOSIS — M545 Low back pain, unspecified: Secondary | ICD-10-CM | POA: Diagnosis not present

## 2022-03-10 DIAGNOSIS — I10 Essential (primary) hypertension: Secondary | ICD-10-CM | POA: Diagnosis not present

## 2022-03-10 DIAGNOSIS — F5101 Primary insomnia: Secondary | ICD-10-CM | POA: Diagnosis not present

## 2022-03-10 DIAGNOSIS — D72829 Elevated white blood cell count, unspecified: Secondary | ICD-10-CM | POA: Diagnosis not present

## 2022-03-10 DIAGNOSIS — E875 Hyperkalemia: Secondary | ICD-10-CM | POA: Diagnosis not present

## 2022-03-10 DIAGNOSIS — Z86711 Personal history of pulmonary embolism: Secondary | ICD-10-CM | POA: Diagnosis not present

## 2022-03-10 DIAGNOSIS — G8929 Other chronic pain: Secondary | ICD-10-CM | POA: Diagnosis not present

## 2022-03-10 DIAGNOSIS — E1159 Type 2 diabetes mellitus with other circulatory complications: Secondary | ICD-10-CM | POA: Diagnosis not present

## 2022-03-10 DIAGNOSIS — E1021 Type 1 diabetes mellitus with diabetic nephropathy: Secondary | ICD-10-CM | POA: Diagnosis not present

## 2022-03-10 DIAGNOSIS — E1140 Type 2 diabetes mellitus with diabetic neuropathy, unspecified: Secondary | ICD-10-CM | POA: Diagnosis not present

## 2022-03-10 DIAGNOSIS — J449 Chronic obstructive pulmonary disease, unspecified: Secondary | ICD-10-CM | POA: Diagnosis not present

## 2022-03-25 DIAGNOSIS — E871 Hypo-osmolality and hyponatremia: Secondary | ICD-10-CM | POA: Diagnosis not present

## 2022-03-25 DIAGNOSIS — E875 Hyperkalemia: Secondary | ICD-10-CM | POA: Diagnosis not present

## 2022-03-25 DIAGNOSIS — D72829 Elevated white blood cell count, unspecified: Secondary | ICD-10-CM | POA: Diagnosis not present

## 2022-03-25 DIAGNOSIS — G8929 Other chronic pain: Secondary | ICD-10-CM | POA: Diagnosis not present

## 2022-03-25 DIAGNOSIS — F5101 Primary insomnia: Secondary | ICD-10-CM | POA: Diagnosis not present

## 2022-03-25 DIAGNOSIS — E1140 Type 2 diabetes mellitus with diabetic neuropathy, unspecified: Secondary | ICD-10-CM | POA: Diagnosis not present

## 2022-03-25 DIAGNOSIS — Z86711 Personal history of pulmonary embolism: Secondary | ICD-10-CM | POA: Diagnosis not present

## 2022-03-25 DIAGNOSIS — E1159 Type 2 diabetes mellitus with other circulatory complications: Secondary | ICD-10-CM | POA: Diagnosis not present

## 2022-03-25 DIAGNOSIS — J449 Chronic obstructive pulmonary disease, unspecified: Secondary | ICD-10-CM | POA: Diagnosis not present

## 2022-03-25 DIAGNOSIS — I10 Essential (primary) hypertension: Secondary | ICD-10-CM | POA: Diagnosis not present

## 2022-03-25 DIAGNOSIS — M545 Low back pain, unspecified: Secondary | ICD-10-CM | POA: Diagnosis not present

## 2022-04-09 DIAGNOSIS — R4182 Altered mental status, unspecified: Secondary | ICD-10-CM | POA: Diagnosis not present

## 2022-04-09 DIAGNOSIS — R062 Wheezing: Secondary | ICD-10-CM | POA: Diagnosis not present

## 2022-04-09 DIAGNOSIS — R9431 Abnormal electrocardiogram [ECG] [EKG]: Secondary | ICD-10-CM | POA: Diagnosis not present

## 2022-04-09 DIAGNOSIS — J9622 Acute and chronic respiratory failure with hypercapnia: Secondary | ICD-10-CM | POA: Diagnosis not present

## 2022-04-09 DIAGNOSIS — J441 Chronic obstructive pulmonary disease with (acute) exacerbation: Secondary | ICD-10-CM | POA: Diagnosis not present

## 2022-04-09 DIAGNOSIS — I700 Atherosclerosis of aorta: Secondary | ICD-10-CM | POA: Diagnosis not present

## 2022-04-09 DIAGNOSIS — J9621 Acute and chronic respiratory failure with hypoxia: Secondary | ICD-10-CM | POA: Diagnosis not present

## 2022-04-09 DIAGNOSIS — E860 Dehydration: Secondary | ICD-10-CM | POA: Diagnosis not present

## 2022-04-09 DIAGNOSIS — R410 Disorientation, unspecified: Secondary | ICD-10-CM | POA: Diagnosis not present

## 2022-04-09 DIAGNOSIS — R0602 Shortness of breath: Secondary | ICD-10-CM | POA: Diagnosis not present

## 2022-04-09 DIAGNOSIS — I10 Essential (primary) hypertension: Secondary | ICD-10-CM | POA: Diagnosis not present

## 2022-04-09 DIAGNOSIS — R109 Unspecified abdominal pain: Secondary | ICD-10-CM | POA: Diagnosis not present

## 2022-04-09 DIAGNOSIS — F0390 Unspecified dementia without behavioral disturbance: Secondary | ICD-10-CM | POA: Diagnosis not present

## 2022-04-09 DIAGNOSIS — J449 Chronic obstructive pulmonary disease, unspecified: Secondary | ICD-10-CM | POA: Diagnosis not present

## 2022-04-10 DIAGNOSIS — J9622 Acute and chronic respiratory failure with hypercapnia: Secondary | ICD-10-CM | POA: Diagnosis not present

## 2022-04-10 DIAGNOSIS — E1021 Type 1 diabetes mellitus with diabetic nephropathy: Secondary | ICD-10-CM | POA: Diagnosis not present

## 2022-04-10 DIAGNOSIS — J9621 Acute and chronic respiratory failure with hypoxia: Secondary | ICD-10-CM | POA: Diagnosis not present

## 2022-04-10 DIAGNOSIS — J441 Chronic obstructive pulmonary disease with (acute) exacerbation: Secondary | ICD-10-CM | POA: Diagnosis not present

## 2022-06-01 ENCOUNTER — Telehealth: Payer: Self-pay

## 2022-06-01 NOTE — Telephone Encounter (Signed)
        Patient  visited [facility] on 2/16     Telephone encounter attempt :  1st  Unable to leave a message   [place], [place] [phone] [address], [place], [place] [address] Phone: [phone] Email: [email or web address]

## 2022-06-02 ENCOUNTER — Telehealth: Payer: Self-pay

## 2022-06-02 NOTE — Telephone Encounter (Signed)
        Patient  visited [facility] on 2/16   Telephone encounter attempt :  2nd  A HIPAA compliant voice message was left requesting a return call.  Instructed patient to call back   [place] [phone] [address], [place], [place] [address] Phone: [phone] Email: [email or web address]

## 2024-02-21 NOTE — Progress Notes (Unsigned)
 [facility] [address] [place],  [place]  [address] [phone]  Clinic Day:  02/27/2024   Referring physician: Ilene Scarce Me*  Patient Care Team: Patient Care Team: Jama Chow, MD as PCP - General (Internal Medicine)   REASON FOR CONSULTATION:  Leukocytosis  HISTORY OF PRESENT ILLNESS:   Dalton Morris is a 68 y.o. male with a history of leukocytosis who is referred in consultation by Dr. Chow Jama for assessment and management.  He was seen for annual wellness visit on November 10.  CBC revealed WBCs 14.6, 58% neutrophils, 29% lymphocytes, 9% monocytes, 3% eosinophils and 1% basophils, hemoglobin 9.9 with an MCV of 89 and platelets 463,000.  He is on aspirin 81 mg daily.  Review of [facility] results reveals he has had chronic leukocytosis with WBCs under 13,000 and mild chronic anemia with hemoglobin above 11 both dating back to March 2023.  He developed mild thrombocytosis with platelets under 550,000 and March 2025.  The patient denies recent infection or steroid use. He reports increased fatigue.  He reports shortness of breath with exertion.  He reports occasional nausea.  He states he has had numbness of his toes for about a year.  He reports occasional headaches and blurry vision.  He reports chronic low back pain.  He denies any overt form of blood loss.  He states he has had a colonoscopy every 3 years due to polyps at Dr. Vinton office and was recently contacted to schedule his next colonoscopy, but declined.  He states he has been on oxygen  for COPD for about 4 years.  He reports a history of previous stroke and pulmonary embolism when evaluated [facility] for shortness of breath and dizziness in June 2019.  MRI brain revealed remote cerebellar infarct, no acute abnormality.  CTA chest did not reveal any evidence of pulmonary embolism or other acute abnormality but did show advanced destructive centrilobular emphysema .  He does not recall being on  anticoagulation other than aspirin after discharge. He uses a walker at home.  Past Medical History: Diabetes, hypertension, COPD, GERD, hypothyroidism, pancreatic insufficiency, osteoarthritis, osteoporosis, peripheral neuropathy.  History of B12 deficiency.  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  Social History: He is former smoker.  He states he quit in 2024.  Smoking included 2 packs of cigarettes daily for 52 years.  He denies current alcohol use.  He previously drank about 6 pack of beer daily.  He denies other substance use.  He was born and raised in the [place] area.  He is divorced with 2 adult children.  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    Family History:  Maternal grandmother with unknown cancer later in life. Otherwise no known family history of blood dyscrasia or other malignancy.   REVIEW OF SYSTEMS:   Review of Systems  Constitutional:  Negative for appetite change, chills, fatigue, fever and unexpected weight change.  HENT:   Positive for trouble swallowing (occasional trouble swallowing liquids). Negative for lump/mass, mouth sores, nosebleeds and sore throat.   Respiratory:  Positive for shortness of breath (with exertion). Negative for cough and hemoptysis.   Cardiovascular:  Negative for chest pain and leg swelling.  Gastrointestinal:  Negative for abdominal pain, blood in stool, constipation, diarrhea, nausea and vomiting.  Genitourinary:  Negative for difficulty urinating, dysuria, frequency and hematuria.   Musculoskeletal:  Positive for  back pain (lower back) and gait problem (unsteady). Negative for arthralgias, myalgias and neck pain.  Skin:  Negative for itching, rash and wound.  Neurological:  Positive for gait problem (unsteady), headaches (intermittent) and numbness (intermittent numbness and tingling  of the feet). Negative for dizziness, extremity weakness and light-headedness.  Hematological:  Negative for adenopathy. Does not bruise/bleed easily.  Psychiatric/Behavioral:  Negative for depression and sleep disturbance. The patient is not nervous/anxious.      VITALS:   Blood pressure (!) 141/71, pulse 98, temperature 98.3 F (36.8 C), resp. rate 20, height 5' 10.5 (1.791 m), weight 210 lb (95.3 kg), SpO2 99%.  Wt Readings from Last 3 Encounters:  02/27/24 210 lb (95.3 kg)  10/12/14 251 lb 8.7 oz (114.1 kg)  10/06/14 252 lb (114.3 kg)    Body mass index is 29.71 kg/m.  Performance status (ECOG): 2 - Symptomatic, <50% confined to bed  PHYSICAL EXAM:   Physical Exam Vitals and nursing note reviewed.  Constitutional:      General: He is not in acute distress.    Appearance: He is ill-appearing (chronically ill appearing).     Comments: He is in a wheelchair on O2 per nasal cannula  HENT:     Head: Normocephalic and atraumatic.     Mouth/Throat:     Mouth: Mucous membranes are moist.     Pharynx: Oropharynx is clear. No oropharyngeal exudate or posterior oropharyngeal erythema.  Eyes:     General: No scleral icterus.    Extraocular Movements: Extraocular movements intact.     Conjunctiva/sclera: Conjunctivae normal.     Pupils: Pupils are equal, round, and reactive to light.  Cardiovascular:     Rate and Rhythm: Normal rate and regular rhythm.     Heart sounds: Normal heart sounds. No murmur heard.    No friction rub. No gallop.  Pulmonary:     Effort: Pulmonary effort is normal.     Breath sounds: Normal breath sounds. No wheezing, rhonchi or rales.  Abdominal:     General: Bowel sounds are normal. There is no distension.     Palpations: Abdomen is soft. There is no mass.     Tenderness: There is no abdominal tenderness.  Musculoskeletal:        General: Normal range of motion.     Cervical back: Normal range of motion and neck supple. No tenderness.     Right  lower leg: No edema.     Left lower leg: No edema.  Lymphadenopathy:     Cervical: No cervical adenopathy.     Upper Body:     Right upper body: No supraclavicular or axillary adenopathy.     Left upper body: No supraclavicular or axillary adenopathy.  Skin:    General: Skin is warm and dry.     Coloration: Skin is pale. Skin is not jaundiced.     Findings: No rash.  Neurological:     Mental Status: He is alert and oriented to person, place, and time.     Cranial Nerves: No cranial nerve deficit.  Psychiatric:        Mood and Affect: Mood normal.        Behavior: Behavior normal.        Thought Content: Thought content normal.      LABS:      Latest Ref Rng & Units 02/27/2024    2:26 PM 07/09/2015   12:40 PM 10/17/2014    5:18 AM  CBC  WBC 4.0 -  10.5 K/uL 15.8  12.8  12.7   Hemoglobin 13.0 - 17.0 g/dL 9.8  84.5  89.0   Hematocrit 39.0 - 52.0 % 32.8  44.1  33.2   Platelets 150 - 400 K/uL 517  411  414       Latest Ref Rng & Units 02/27/2024    2:26 PM 07/09/2015   12:40 PM 10/17/2014    5:18 AM  CMP  Glucose 70 - 99 mg/dL 797  826  875   BUN 8 - 23 mg/dL 20  17  9    Creatinine 0.61 - 1.24 mg/dL 8.81  9.23  9.20   Sodium 135 - 145 mmol/L 137  136  138   Potassium 3.5 - 5.1 mmol/L 4.6  4.9  3.1   Chloride 98 - 111 mmol/L 98  100  102   CO2 22 - 32 mmol/L 30  26  30    Calcium 8.9 - 10.3 mg/dL 9.2  9.8  8.2   Total Protein 6.5 - 8.1 g/dL 7.7  8.2    Total Bilirubin 0.0 - 1.2 mg/dL 0.3  0.7    Alkaline Phos 38 - 126 U/L 56  56    AST 15 - 41 U/L 24  22    ALT 0 - 44 U/L 20  28      Lab Results  Component Value Date   TIBC 434 02/27/2024   FERRITIN 14 (L) 02/27/2024   IRONPCTSAT 5 (L) 02/27/2024   Lab Results  Component Value Date   LDH 182 02/27/2024    Latest Reference Range & Units 02/27/24 14:26 02/27/24 14:27  Folate >5.9 ng/mL  9.2  Vitamin B12 180 - 914 pg/mL 374      STUDIES:   No results found.    HISTORY:   Past Medical History:  Diagnosis  Date   Abdominal bloating    Abdominal pain     left side- beneath left ribcage   Cancer (HCC)    history skin cancer   Chronic back pain    Chronic pain    COPD (chronic obstructive pulmonary disease) (HCC)    CVA (cerebral vascular accident) (HCC)    Dementia (HCC)    Diabetes (HCC)    Finger pain    rt middle finger pain intermitant   GERD (gastroesophageal reflux disease)    H/O dizziness    intermitant episodes - worse with motion    Hearing loss    History of oxygen  administration    2l/m per nasally at bedtime as needed(in use for 4 months)   History of skin cancer    Hypertension    Hypogonadism in male    Hypothyroid    Insomnia    Myocardial infarction [facility])    MI -3 yrs ago (mild)   Neuropathy    Osteoarthritis    Osteoporosis    Pain    chronic pain -back   Pancreatic pseudocyst    Pulmonary embolism (HCC)    Shortness of breath dyspnea    07-22-14 aubible wheezes while talking with pt today. Uses nebulizer-started a week ago after hospital visit [facility] for SOB.   Umbilical hernia    at present   Vitamin B 12 deficiency     Past Surgical History:  Procedure Laterality Date   COLONOSCOPY     ESOPHAGOGASTRODUODENOSCOPY ENDOSCOPY     EUS N/A 07/24/2014   Procedure: UPPER ENDOSCOPIC ULTRASOUND (EUS) LINEAR;  Surgeon: Toribio SHAUNNA Cedar, MD;  Location: [facility] ENDOSCOPY;  Service: Endoscopy;  Laterality: N/A;   HERNIA REPAIR     MELANOMA EXCISION Left    arm   ORIF FIBULA FRACTURE Left    PANCREATECTOMY N/A 10/09/2014   Procedure: LAPAROSCOPIC CONVERTED TO OPEN PANCREATECTOMY, SPLENECTOMY, REPAIR UMBILICAL HERNIA;  Surgeon: Jina Nephew, MD;  Location: [facility] ORS;  Service: General;  Laterality: N/A;   SPLENECTOMY     TONSILLECTOMY     child   UMBILICAL HERNIA REPAIR N/A 10/09/2014   Procedure:  UMBILICAL HERNIA REPAIR ;  Surgeon: Jina Nephew, MD;  Location: [facility] ORS;  Service: General;  Laterality: N/A;    Family History  Problem Relation Age of  Onset   Seizures Mother    Thyroid  disease Mother    Diabetes Mellitus II Mother    Hyperlipidemia Mother    Hypertension Father    Heart disease Father    Heart attack Father     Social History:  reports that he quit smoking about 22 months ago. His smoking use included cigarettes. He started smoking about 52 years ago. He has a 102 pack-year smoking history. He does not have any smokeless tobacco history on file. He reports that he does not drink alcohol and does not use drugs.The patient is accompanied by his daughter, Avelina, and his grandson, Gust today.  Allergies:  Allergies  Allergen Reactions   Hydrocodone Itching   Iodine-131 Hives and Itching    Still had itiching and one hive after contrast 10/05/15. Scan was delayed due to waiting on new order.   Ioxaglate Hives    Pt states he was advised to have diphenhydramine  with iodinated diagnostic agents.      Pt states he was advised to have diphenhydramine  with iodinated diagnostic agents.   Codeine Dermatitis    Stomach ache   Gabapentin Other (See Comments) and Tinitus    Dizziness, tinnitus, hallucinations, and somnolence.  Dizziness, tinnitus, hallucinations, and somnolence.    Contrast Media [Iodinated Contrast Media] Hives    Pt states he was advised to have diphenhydramine  with iodinated diagnostic agents.       Tizanidine Other (See Comments)    headache    Current Medications: Current Outpatient Medications  Medication Sig Dispense Refill   alendronate (FOSAMAX) 70 MG/75ML solution Take 70 mg by mouth every 7 (seven) days. Take with a full glass of water on an empty stomach.     budesonide-formoterol (SYMBICORT) 160-4.5 MCG/ACT inhaler Inhale 2 puffs into the lungs 2 (two) times daily.     clotrimazole-betamethasone (LOTRISONE) lotion Apply topically 2 (two) times daily.     furosemide (LASIX) 20 MG tablet Take 20 mg by mouth as needed.     insulin  glargine (LANTUS ) 100 UNIT/ML injection Inject 20 Units into  the skin daily.     ipratropium-albuterol  (DUONEB) 0.5-2.5 (3) MG/3ML SOLN Take 3 mLs by nebulization every 6 (six) hours as needed.     levothyroxine (SYNTHROID) 50 MCG tablet Take 50 mcg by mouth daily before breakfast.     linaclotide (LINZESS) 145 MCG CAPS capsule Take 145 mcg by mouth daily before breakfast.     meclizine (ANTIVERT) 25 MG tablet Take 25 mg by mouth 3 (three) times daily as needed for dizziness.     metoprolol  tartrate (LOPRESSOR ) 25 MG tablet Take 25 mg by mouth 2 (two) times daily.     nystatin (MYCOSTATIN) 100000 UNIT/ML suspension Take 5 mLs by mouth 4 (four) times daily.     omeprazole (PRILOSEC) 40 MG capsule Take 40 mg by  mouth daily.     oxyCODONE -acetaminophen  (PERCOCET/ROXICET) 5-325 MG tablet Take 1 tablet by mouth every 8 (eight) hours as needed for severe pain (pain score 7-10).     OXYGEN  Inhale 3 L into the lungs daily at 6 (six) AM.     Pancrelipase, Lip-Prot-Amyl, (CREON) 24000-76000 units CPEP Take 1 capsule by mouth 3 (three) times daily.     promethazine  (PHENERGAN ) 25 MG tablet Take 25 mg by mouth every 6 (six) hours as needed for nausea or vomiting.     sitaGLIPtin (JANUVIA) 100 MG tablet Take 100 mg by mouth daily.     albuterol  (PROVENTIL ) (2.5 MG/3ML) 0.083% nebulizer solution Take 2.5 mg by nebulization 2 (two) times daily.     aspirin EC 81 MG tablet Take 81 mg by mouth daily.     metFORMIN (GLUCOPHAGE) 850 MG tablet Take 850 mg by mouth daily with breakfast.     pregabalin (LYRICA) 200 MG capsule Take 200 mg by mouth 2 (two) times daily.     No current facility-administered medications for this visit.     ASSESSMENT & PLAN:   Assessment/Plan:  Dalton Morris is a 68 y.o. male  Chronic leukocytosis likely related to cigarette smoking, although he has quit and the leukocytosis has worsened slightly.  I will evaluate for myeloproliferative syndrome Normochromic, normocytic anemia.  He has iron deficiency anemia most likely due to chronic GI  blood loss.  He did not really wish to undergo colonoscopy, but I recommend he see Dr. Larene for consideration of EGD and colonoscopy.  Due to the severity of iron deficiency, I will arrange for him to receive IV iron in the upcoming days.  We discussed the rare but serious side effect of allergic reaction, lowering blood pressure and shortness of breath, as well as more common side effects of headache, flushing, nausea, muscle and joint aches, and irritation at the infusion/injection site.  There is no other nutritional deficiency or evidence of hemolysis. Thrombocytosis, most likely due to iron deficiency  I discussed the assessment and treatment plan with the patient and his family.  The patient was provided an opportunity to ask questions and all were answered.  I will plan to see this patient back in 2 weeks to review the results.  The patient agreed with the plan and demonstrated an understanding of the instructions.    Thank you for the referral.    45 minutes was spent in patient care.  This included time spent preparing to see the patient (e.g., review of tests), obtaining and/or reviewing separately obtained history, counseling and educating the patient/family/caregiver, ordering medications, tests, or procedures; documenting clinical information in the electronic or other health record, independently interpreting results and communicating results to the patient/family/caregiver as well as coordination of care.      Andrez DELENA Foy, PA-C   Physician Assistant [facility] [place] [phone]

## 2024-02-22 ENCOUNTER — Encounter: Payer: Self-pay | Admitting: Hematology and Oncology

## 2024-02-22 DIAGNOSIS — F0390 Unspecified dementia without behavioral disturbance: Secondary | ICD-10-CM | POA: Insufficient documentation

## 2024-02-27 ENCOUNTER — Encounter: Payer: Self-pay | Admitting: Hematology and Oncology

## 2024-02-27 ENCOUNTER — Other Ambulatory Visit: Payer: Self-pay

## 2024-02-27 ENCOUNTER — Inpatient Hospital Stay: Attending: Hematology and Oncology | Admitting: Hematology and Oncology

## 2024-02-27 ENCOUNTER — Other Ambulatory Visit: Payer: Self-pay | Admitting: Hematology and Oncology

## 2024-02-27 ENCOUNTER — Inpatient Hospital Stay

## 2024-02-27 VITALS — BP 141/71 | HR 98 | Temp 98.3°F | Resp 20 | Ht 70.5 in | Wt 210.0 lb

## 2024-02-27 DIAGNOSIS — Z79620 Long term (current) use of immunosuppressive biologic: Secondary | ICD-10-CM | POA: Insufficient documentation

## 2024-02-27 DIAGNOSIS — D509 Iron deficiency anemia, unspecified: Secondary | ICD-10-CM | POA: Insufficient documentation

## 2024-02-27 DIAGNOSIS — D72829 Elevated white blood cell count, unspecified: Secondary | ICD-10-CM | POA: Insufficient documentation

## 2024-02-27 DIAGNOSIS — D75839 Thrombocytosis, unspecified: Secondary | ICD-10-CM | POA: Insufficient documentation

## 2024-02-27 DIAGNOSIS — D649 Anemia, unspecified: Secondary | ICD-10-CM

## 2024-02-27 DIAGNOSIS — Z9081 Acquired absence of spleen: Secondary | ICD-10-CM

## 2024-02-27 DIAGNOSIS — Z79899 Other long term (current) drug therapy: Secondary | ICD-10-CM | POA: Insufficient documentation

## 2024-02-27 LAB — CBC WITH DIFFERENTIAL (CANCER CENTER ONLY)
Abs Immature Granulocytes: 0.08 K/uL — ABNORMAL HIGH (ref 0.00–0.07)
Basophils Absolute: 0.1 K/uL (ref 0.0–0.1)
Basophils Relative: 1 %
Eosinophils Absolute: 0.6 K/uL — ABNORMAL HIGH (ref 0.0–0.5)
Eosinophils Relative: 4 %
HCT: 32.8 % — ABNORMAL LOW (ref 39.0–52.0)
Hemoglobin: 9.8 g/dL — ABNORMAL LOW (ref 13.0–17.0)
Immature Granulocytes: 1 %
Lymphocytes Relative: 18 %
Lymphs Abs: 2.9 K/uL (ref 0.7–4.0)
MCH: 26.1 pg (ref 26.0–34.0)
MCHC: 29.9 g/dL — ABNORMAL LOW (ref 30.0–36.0)
MCV: 87.5 fL (ref 80.0–100.0)
Monocytes Absolute: 1.4 K/uL — ABNORMAL HIGH (ref 0.1–1.0)
Monocytes Relative: 9 %
Neutro Abs: 10.6 K/uL — ABNORMAL HIGH (ref 1.7–7.7)
Neutrophils Relative %: 67 %
Platelet Count: 517 K/uL — ABNORMAL HIGH (ref 150–400)
RBC: 3.75 MIL/uL — ABNORMAL LOW (ref 4.22–5.81)
RDW: 17.4 % — ABNORMAL HIGH (ref 11.5–15.5)
WBC Count: 15.8 K/uL — ABNORMAL HIGH (ref 4.0–10.5)
nRBC: 0.3 % — ABNORMAL HIGH (ref 0.0–0.2)

## 2024-02-27 LAB — VITAMIN B12: Vitamin B-12: 374 pg/mL (ref 180–914)

## 2024-02-27 LAB — IRON AND TIBC
Iron: 20 ug/dL — ABNORMAL LOW (ref 45–182)
Saturation Ratios: 5 % — ABNORMAL LOW (ref 17.9–39.5)
TIBC: 434 ug/dL (ref 250–450)
UIBC: 414 ug/dL

## 2024-02-27 LAB — TECHNOLOGIST SMEAR REVIEW

## 2024-02-27 LAB — CMP (CANCER CENTER ONLY)
ALT: 20 U/L (ref 0–44)
AST: 24 U/L (ref 15–41)
Albumin: 4 g/dL (ref 3.5–5.0)
Alkaline Phosphatase: 56 U/L (ref 38–126)
Anion gap: 9 (ref 5–15)
BUN: 20 mg/dL (ref 8–23)
CO2: 30 mmol/L (ref 22–32)
Calcium: 9.2 mg/dL (ref 8.9–10.3)
Chloride: 98 mmol/L (ref 98–111)
Creatinine: 1.18 mg/dL (ref 0.61–1.24)
GFR, Estimated: >60 mL/min (ref >60)
Glucose, Bld: 202 mg/dL — ABNORMAL HIGH (ref 70–99)
Potassium: 4.6 mmol/L (ref 3.5–5.1)
Sodium: 137 mmol/L (ref 135–145)
Total Bilirubin: 0.3 mg/dL (ref 0.0–1.2)
Total Protein: 7.7 g/dL (ref 6.5–8.1)

## 2024-02-27 LAB — FERRITIN: Ferritin: 14 ng/mL — ABNORMAL LOW (ref 24–336)

## 2024-02-27 LAB — FOLATE: Folate: 9.2 ng/mL (ref >5.9)

## 2024-02-27 LAB — LACTATE DEHYDROGENASE: LDH: 182 U/L (ref 105–235)

## 2024-02-27 LAB — TSH: TSH: 2.59 u[IU]/mL (ref 0.350–4.500)

## 2024-02-28 ENCOUNTER — Encounter: Payer: Self-pay | Admitting: Hematology and Oncology

## 2024-02-28 DIAGNOSIS — D509 Iron deficiency anemia, unspecified: Secondary | ICD-10-CM | POA: Insufficient documentation

## 2024-02-28 LAB — HAPTOGLOBIN: Haptoglobin: 185 mg/dL (ref 32–363)

## 2024-02-29 ENCOUNTER — Telehealth: Payer: Self-pay

## 2024-02-29 LAB — PROTEIN ELECTROPHORESIS, SERUM, WITH REFLEX
A/G Ratio: 0.9 (ref 0.7–1.7)
Albumin ELP: 3.6 g/dL (ref 2.9–4.4)
Alpha-1-Globulin: 0.2 g/dL (ref 0.0–0.4)
Alpha-2-Globulin: 1 g/dL (ref 0.4–1.0)
Beta Globulin: 1.4 g/dL — ABNORMAL HIGH (ref 0.7–1.3)
Gamma Globulin: 1.4 g/dL (ref 0.4–1.8)
Globulin, Total: 3.9 g/dL (ref 2.2–3.9)
Total Protein ELP: 7.5 g/dL (ref 6.0–8.5)

## 2024-02-29 NOTE — Telephone Encounter (Signed)
 Referral has been faxed.

## 2024-02-29 NOTE — Telephone Encounter (Signed)
-----   Message from Andrez DELENA Foy sent at 02/28/2024  3:36 PM EST ----- Please refer to Dr. Larene for evaluation of iron deficiency. Thanks

## 2024-03-01 ENCOUNTER — Telehealth: Payer: Self-pay | Admitting: Hematology and Oncology

## 2024-03-01 LAB — BCR-ABL1 FISH
Cells Analyzed: 200
Cells Counted: 200

## 2024-03-01 NOTE — Telephone Encounter (Signed)
 Contacted pt to schedule an appt. Unable to reach via phone, voicemail box is full.

## 2024-03-01 NOTE — Telephone Encounter (Signed)
-----   Message from Andrez DELENA Foy sent at 02/28/2024  3:33 PM EST ----- Please schedule Feraheme x 2, can coordinate 1st dose with f/u visit on 12/2, but please let him or his daughter, Avelina know. Thank you!

## 2024-03-11 ENCOUNTER — Encounter: Payer: Self-pay | Admitting: Hematology and Oncology

## 2024-03-12 ENCOUNTER — Other Ambulatory Visit: Payer: Self-pay

## 2024-03-12 ENCOUNTER — Inpatient Hospital Stay: Attending: Hematology and Oncology | Admitting: Hematology and Oncology

## 2024-03-12 ENCOUNTER — Inpatient Hospital Stay

## 2024-03-12 ENCOUNTER — Encounter: Payer: Self-pay | Admitting: Hematology and Oncology

## 2024-03-12 VITALS — BP 137/73 | HR 86 | Temp 99.4°F | Resp 16 | Ht 70.5 in | Wt 210.0 lb

## 2024-03-12 VITALS — BP 124/70 | HR 81 | Resp 16

## 2024-03-12 DIAGNOSIS — D509 Iron deficiency anemia, unspecified: Secondary | ICD-10-CM | POA: Diagnosis present

## 2024-03-12 DIAGNOSIS — D75838 Other thrombocytosis: Secondary | ICD-10-CM

## 2024-03-12 MED ORDER — ACETAMINOPHEN 325 MG PO TABS
650.0000 mg | ORAL_TABLET | Freq: Once | ORAL | Status: DC
  Filled 2024-03-12: qty 2

## 2024-03-12 MED ORDER — SODIUM CHLORIDE 0.9 % IV SOLN
510.0000 mg | Freq: Once | INTRAVENOUS | Status: AC
  Administered 2024-03-12: 510 mg via INTRAVENOUS
  Filled 2024-03-12: qty 510

## 2024-03-12 MED ORDER — LORATADINE 10 MG PO TABS
10.0000 mg | ORAL_TABLET | Freq: Once | ORAL | Status: AC
  Administered 2024-03-12: 10 mg via ORAL
  Filled 2024-03-12: qty 1

## 2024-03-12 MED ORDER — SODIUM CHLORIDE 0.9 % IV SOLN: INTRAVENOUS | Status: DC

## 2024-03-12 NOTE — Patient Instructions (Signed)

## 2024-03-12 NOTE — Progress Notes (Cosign Needed)
 [facility]  [address] [place],  [place]  [address] [phone]  Clinic Day:  03/12/2024  Referring physician: Jama Chow, MD   HISTORY OF PRESENT ILLNESS:  The patient is a 68 y.o. male with leukocytosis and anemia. Review of [facility] results revealed he has had chronic mild leukocytosis with WBCs under 13,000 and mild chronic anemia with hemoglobin above 11 both dating back to March 2023.  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  He is here today for results of his testing.  He denies any overt form of blood loss.  BCR/ABL for chronic myelogenous leukemia was negative. He was found to have iron deficiency and is scheduled for IV iron in the form of Feraheme today.  He denies progressive fatigue concerning for worsening anemia. He reports trouble swallowing liquids. He was referred to Dr. Larene for consideration of EGD and colonoscopy but has not been scheduled yet. He had been having colonoscopy every 3 years due to history of polyps, but declined his last one.  He states he would prefer not to have another colonoscopy as he felt sick after his last one in June 2020.  He had removal of a tubular adenoma at that time.   VITALS:   Blood pressure 137/73, pulse 86, temperature 99.4 F (37.4 C), temperature source Oral, resp. rate 16, height 5' 10.5 (1.791 m), weight 210 lb (95.3 kg), SpO2 99%. Wt Readings from Last 3 Encounters:  03/12/24 210 lb (95.3 kg)  02/27/24 210 lb (95.3 kg)  10/12/14 251 lb 8.7 oz (114.1 kg)   Body mass index is 29.71 kg/m.  Performance status (ECOG): 2 - Symptomatic, <50% confined to bed  PHYSICAL EXAM:   Physical Exam Vitals and nursing note reviewed.  Constitutional:      General: He is not in acute distress.    Appearance: Normal appearance. He is normal weight.  HENT:     Head: Normocephalic and atraumatic.     Mouth/Throat:     Mouth: Mucous membranes are moist.     Pharynx: Oropharynx is  clear. No oropharyngeal exudate or posterior oropharyngeal erythema.  Eyes:     General: No scleral icterus.    Extraocular Movements: Extraocular movements intact.     Conjunctiva/sclera: Conjunctivae normal.     Pupils: Pupils are equal, round, and reactive to light.  Cardiovascular:     Rate and Rhythm: Normal rate and regular rhythm.     Heart sounds: Normal heart sounds. No murmur heard.    No friction rub. No gallop.  Pulmonary:     Effort: Pulmonary effort is normal.     Breath sounds: Normal breath sounds. No wheezing, rhonchi or rales.  Abdominal:     General: Bowel sounds are normal. There is no distension.     Palpations: Abdomen is soft. There is no mass.     Tenderness: There is no abdominal tenderness.  Musculoskeletal:        General: Normal range of motion.     Cervical back: Normal range of motion and neck supple. No tenderness.     Right lower leg: No edema.     Left lower leg: No edema.  Lymphadenopathy:     Cervical: No cervical adenopathy.     Upper Body:     Right upper body: No supraclavicular or axillary adenopathy.     Left upper body: No supraclavicular or axillary adenopathy.  Skin:    General: Skin is warm  and dry.     Coloration: Skin is not jaundiced.     Findings: No rash.  Neurological:     Mental Status: He is alert and oriented to person, place, and time.     Cranial Nerves: No cranial nerve deficit.  Psychiatric:        Mood and Affect: Mood normal.        Behavior: Behavior normal.        Thought Content: Thought content normal.      LABS:      Latest Ref Rng & Units 02/27/2024    2:26 PM 07/09/2015   12:40 PM 10/17/2014    5:18 AM  CBC  WBC 4.0 - 10.5 K/uL 15.8  12.8  12.7   Hemoglobin 13.0 - 17.0 g/dL 9.8  84.5  89.0   Hematocrit 39.0 - 52.0 % 32.8  44.1  33.2   Platelets 150 - 400 K/uL 517  411  414       Latest Ref Rng & Units 02/27/2024    2:26 PM 07/09/2015   12:40 PM 10/17/2014    5:18 AM  CMP  Glucose 70 - 99 mg/dL 797   826  875   BUN 8 - 23 mg/dL 20  17  9    Creatinine 0.61 - 1.24 mg/dL 8.81  9.23  9.20   Sodium 135 - 145 mmol/L 137  136  138   Potassium 3.5 - 5.1 mmol/L 4.6  4.9  3.1   Chloride 98 - 111 mmol/L 98  100  102   CO2 22 - 32 mmol/L 30  26  30    Calcium 8.9 - 10.3 mg/dL 9.2  9.8  8.2   Total Protein 6.5 - 8.1 g/dL 7.7  8.2    Total Bilirubin 0.0 - 1.2 mg/dL 0.3  0.7    Alkaline Phos 38 - 126 U/L 56  56    AST 15 - 41 U/L 24  22    ALT 0 - 44 U/L 20  28     Lab Results  Component Value Date   TOTALPROTELP 7.5 02/27/2024   ALBUMINELP 3.6 02/27/2024   A1GS 0.2 02/27/2024   A2GS 1.0 02/27/2024   BETS 1.4 (H) 02/27/2024   GAMS 1.4 02/27/2024   MSPIKE Not Observed 02/27/2024   Lab Results  Component Value Date   TIBC 434 02/27/2024   FERRITIN 14 (L) 02/27/2024   IRONPCTSAT 5 (L) 02/27/2024   Lab Results  Component Value Date   LDH 182 02/27/2024       Component Value Date/Time   TOTALPROTELP 7.5 02/27/2024 1426   ALBUMINELP 3.6 02/27/2024 1426   A1GS 0.2 02/27/2024 1426   A2GS 1.0 02/27/2024 1426   BETS 1.4 (H) 02/27/2024 1426   GAMS 1.4 02/27/2024 1426   MSPIKE Not Observed 02/27/2024 1426   LDH 182 02/27/2024 1426    Review Flowsheet       Latest Ref Rng & Units 02/27/2024  Oncology Labs  Ferritin 24 - 336 ng/mL 14   %SAT 17.9 - 39.5 % 5   Total Protein ELP 6.0 - 8.5 g/dL 7.5   Albumin  ELP 2.9 - 4.4 g/dL 3.6   Alpha-1 Globulin 0.0 - 0.4 g/dL 0.2   Alpha-2 Globulin 0.4 - 1.0 g/dL 1.0   Beta Globulin 0.7 - 1.3 g/dL 1.4   Gamma Globulin 0.4 - 1.8 g/dL 1.4   M-Spike, % Not Observed g/dL Not Observed   LDH 894 - 235 U/L 182  STUDIES:   No results found.    ASSESSMENT & PLAN:   Assessment/Plan:  68 y.o. male with leukocytosis and iron deficiency anemia.  No specific cause was found for the chronic leukocytosis.  He was found to have iron deficiency anemia and will receive IV iron today and again in 1 week.  I encouraged him to see Dr. Larene we  will check on the referral.  I will plan to see him back in 6 weeks for repeat clinical assessment.  The patient and his daughter understand all the plans discussed today and are in agreement with them.  They know to contact our office if he develops concerns prior to his next appointment.     Andrez DELENA Foy, PA-C   Physician Assistant [facility] [place] [phone]

## 2024-03-18 ENCOUNTER — Inpatient Hospital Stay

## 2024-03-18 VITALS — BP 117/52 | HR 76 | Temp 98.4°F | Resp 18 | Ht 70.5 in

## 2024-03-18 DIAGNOSIS — D509 Iron deficiency anemia, unspecified: Secondary | ICD-10-CM

## 2024-03-18 MED ORDER — SODIUM CHLORIDE 0.9 % IV SOLN: INTRAVENOUS | Status: DC

## 2024-03-18 MED ORDER — ACETAMINOPHEN 325 MG PO TABS: 650.0000 mg | ORAL_TABLET | Freq: Once | ORAL | Status: DC

## 2024-03-18 MED ORDER — LORATADINE 10 MG PO TABS: 10.0000 mg | ORAL_TABLET | Freq: Once | ORAL | Status: DC

## 2024-03-18 MED ORDER — SODIUM CHLORIDE 0.9 % IV SOLN
510.0000 mg | Freq: Once | INTRAVENOUS | Status: AC
  Administered 2024-03-18: 510 mg via INTRAVENOUS
  Filled 2024-03-18: qty 510

## 2024-03-18 NOTE — Patient Instructions (Signed)

## 2024-04-23 ENCOUNTER — Inpatient Hospital Stay

## 2024-04-23 ENCOUNTER — Inpatient Hospital Stay: Admitting: Hematology and Oncology

## 2024-04-26 ENCOUNTER — Inpatient Hospital Stay: Admitting: Hematology and Oncology

## 2024-04-26 ENCOUNTER — Other Ambulatory Visit: Payer: Self-pay | Admitting: Hematology and Oncology

## 2024-04-26 ENCOUNTER — Inpatient Hospital Stay: Attending: Hematology and Oncology

## 2024-04-26 DIAGNOSIS — D509 Iron deficiency anemia, unspecified: Secondary | ICD-10-CM

## 2024-04-26 NOTE — Progress Notes (Unsigned)
 " [facility]  [address] [place],  [place]  [address] [phone]  Clinic Day:  04/26/2024  Referring physician: Jama Chow, MD   HISTORY OF PRESENT ILLNESS:  The patient is a 69 y.o. male with ***   VITALS:   There were no vitals taken for this visit. Wt Readings from Last 3 Encounters:  03/12/24 210 lb (95.3 kg)  02/27/24 210 lb (95.3 kg)  10/12/14 251 lb 8.7 oz (114.1 kg)   There is no height or weight on file to calculate BMI.  Performance status (ECOG): {CHL ONC [id number]  PHYSICAL EXAM:   Physical Exam   LABS:      Latest Ref Rng & Units 02/27/2024    2:26 PM 07/09/2015   12:40 PM 10/17/2014    5:18 AM  CBC  WBC 4.0 - 10.5 K/uL 15.8  12.8  12.7   Hemoglobin 13.0 - 17.0 g/dL 9.8  84.5  89.0   Hematocrit 39.0 - 52.0 % 32.8  44.1  33.2   Platelets 150 - 400 K/uL 517  411  414       Latest Ref Rng & Units 02/27/2024    2:26 PM 07/09/2015   12:40 PM 10/17/2014    5:18 AM  CMP  Glucose 70 - 99 mg/dL 797  826  875   BUN 8 - 23 mg/dL 20  17  9    Creatinine 0.61 - 1.24 mg/dL 8.81  9.23  9.20   Sodium 135 - 145 mmol/L 137  136  138   Potassium 3.5 - 5.1 mmol/L 4.6  4.9  3.1   Chloride 98 - 111 mmol/L 98  100  102   CO2 22 - 32 mmol/L 30  26  30    Calcium 8.9 - 10.3 mg/dL 9.2  9.8  8.2   Total Protein 6.5 - 8.1 g/dL 7.7  8.2    Total Bilirubin 0.0 - 1.2 mg/dL 0.3  0.7    Alkaline Phos 38 - 126 U/L 56  56    AST 15 - 41 U/L 24  22    ALT 0 - 44 U/L 20  28       No results found for: CEA1, CEA / No results found for: CEA1, CEA No results found for: PSA1 No results found for: CAN199 No results found for: RJW874  Lab Results  Component Value Date   TOTALPROTELP 7.5 02/27/2024   ALBUMINELP 3.6 02/27/2024   A1GS 0.2 02/27/2024   A2GS 1.0 02/27/2024   BETS 1.4 (H) 02/27/2024   GAMS 1.4 02/27/2024   MSPIKE Not Observed 02/27/2024   Lab Results  Component Value Date   TIBC 434 02/27/2024   FERRITIN 14 (L)  02/27/2024   IRONPCTSAT 5 (L) 02/27/2024   Lab Results  Component Value Date   LDH 182 02/27/2024       Component Value Date/Time   TOTALPROTELP 7.5 02/27/2024 1426   ALBUMINELP 3.6 02/27/2024 1426   A1GS 0.2 02/27/2024 1426   A2GS 1.0 02/27/2024 1426   BETS 1.4 (H) 02/27/2024 1426   GAMS 1.4 02/27/2024 1426   MSPIKE Not Observed 02/27/2024 1426   LDH 182 02/27/2024 1426    Review Flowsheet       Latest Ref Rng & Units 02/27/2024  Oncology Labs  Ferritin 24 - 336 ng/mL 14   %SAT 17.9 - 39.5 % 5   Total Protein ELP 6.0 - 8.5 g/dL 7.5   Albumin  ELP 2.9 - 4.4 g/dL  3.6   Alpha-1 Globulin 0.0 - 0.4 g/dL 0.2   Alpha-2 Globulin 0.4 - 1.0 g/dL 1.0   Beta Globulin 0.7 - 1.3 g/dL 1.4   Gamma Globulin 0.4 - 1.8 g/dL 1.4   M-Spike, % Not Observed g/dL Not Observed   LDH 894 - 235 U/L 182      STUDIES:   No results found.    ASSESSMENT & PLAN:   Assessment/Plan:  69 y.o. male with ***  The patient understands all the plans discussed today and is in agreement with them.  He knows to contact our office if he develops concerns prior to his next appointment.     Andrez DELENA Foy, PA-C   Physician Assistant [facility] [phone]    "
